# Patient Record
Sex: Female | Born: 1950 | Race: White | Hispanic: No | Smoking: Former smoker
Health system: Southern US, Community
[De-identification: ages and names within clinical notes are randomized; demographics above are authoritative.]

## PROBLEM LIST (undated history)

## (undated) DIAGNOSIS — R413 Other amnesia: Secondary | ICD-10-CM

## (undated) DIAGNOSIS — D649 Anemia, unspecified: Secondary | ICD-10-CM

## (undated) DIAGNOSIS — H9190 Unspecified hearing loss, unspecified ear: Secondary | ICD-10-CM

## (undated) DIAGNOSIS — H547 Unspecified visual loss: Secondary | ICD-10-CM

## (undated) DIAGNOSIS — E46 Unspecified protein-calorie malnutrition: Secondary | ICD-10-CM

## (undated) DIAGNOSIS — IMO0002 Reserved for concepts with insufficient information to code with codable children: Secondary | ICD-10-CM

## (undated) DIAGNOSIS — R471 Dysarthria and anarthria: Secondary | ICD-10-CM

## (undated) DIAGNOSIS — I10 Essential (primary) hypertension: Secondary | ICD-10-CM

## (undated) DIAGNOSIS — M1990 Unspecified osteoarthritis, unspecified site: Secondary | ICD-10-CM

## (undated) DIAGNOSIS — R0602 Shortness of breath: Secondary | ICD-10-CM

## (undated) DIAGNOSIS — I639 Cerebral infarction, unspecified: Secondary | ICD-10-CM

## (undated) DIAGNOSIS — N1330 Unspecified hydronephrosis: Secondary | ICD-10-CM

## (undated) DIAGNOSIS — J449 Chronic obstructive pulmonary disease, unspecified: Secondary | ICD-10-CM

## (undated) DIAGNOSIS — E785 Hyperlipidemia, unspecified: Secondary | ICD-10-CM

## (undated) DIAGNOSIS — Z720 Tobacco use: Secondary | ICD-10-CM

## (undated) DIAGNOSIS — G8191 Hemiplegia, unspecified affecting right dominant side: Secondary | ICD-10-CM

## (undated) DIAGNOSIS — R339 Retention of urine, unspecified: Secondary | ICD-10-CM

## (undated) DIAGNOSIS — B9562 Methicillin resistant Staphylococcus aureus infection as the cause of diseases classified elsewhere: Secondary | ICD-10-CM

## (undated) DIAGNOSIS — A419 Sepsis, unspecified organism: Secondary | ICD-10-CM

## (undated) DIAGNOSIS — E039 Hypothyroidism, unspecified: Secondary | ICD-10-CM

## (undated) DIAGNOSIS — R269 Unspecified abnormalities of gait and mobility: Secondary | ICD-10-CM

## (undated) DIAGNOSIS — F32A Depression, unspecified: Secondary | ICD-10-CM

## (undated) DIAGNOSIS — J189 Pneumonia, unspecified organism: Secondary | ICD-10-CM

## (undated) DIAGNOSIS — I509 Heart failure, unspecified: Secondary | ICD-10-CM

## (undated) DIAGNOSIS — R7881 Bacteremia: Secondary | ICD-10-CM

## (undated) DIAGNOSIS — F329 Major depressive disorder, single episode, unspecified: Secondary | ICD-10-CM

## (undated) DIAGNOSIS — N289 Disorder of kidney and ureter, unspecified: Secondary | ICD-10-CM

## (undated) HISTORY — PX: STRABISMUS SURGERY: SHX218

## (undated) HISTORY — PX: ARM DEBRIDEMENT: SHX890

## (undated) HISTORY — DX: Other amnesia: R41.3

## (undated) HISTORY — DX: Cerebral infarction, unspecified: I63.9

## (undated) HISTORY — DX: Unspecified abnormalities of gait and mobility: R26.9

## (undated) HISTORY — DX: Unspecified visual loss: H54.7

---

## 2001-04-26 ENCOUNTER — Ambulatory Visit (HOSPITAL_COMMUNITY): Admission: RE | Admit: 2001-04-26 | Discharge: 2001-04-26 | Payer: Self-pay | Admitting: Internal Medicine

## 2006-11-06 ENCOUNTER — Ambulatory Visit (HOSPITAL_COMMUNITY): Admission: RE | Admit: 2006-11-06 | Discharge: 2006-11-06 | Payer: Self-pay | Admitting: Family Medicine

## 2007-10-29 ENCOUNTER — Emergency Department (HOSPITAL_COMMUNITY): Admission: EM | Admit: 2007-10-29 | Discharge: 2007-10-30 | Payer: Self-pay | Admitting: Emergency Medicine

## 2009-01-07 ENCOUNTER — Ambulatory Visit: Payer: Self-pay | Admitting: Cardiology

## 2009-05-04 ENCOUNTER — Ambulatory Visit: Payer: Self-pay | Admitting: Gastroenterology

## 2009-05-04 ENCOUNTER — Encounter: Payer: Self-pay | Admitting: Internal Medicine

## 2009-05-04 DIAGNOSIS — D649 Anemia, unspecified: Secondary | ICD-10-CM | POA: Insufficient documentation

## 2009-05-04 DIAGNOSIS — R109 Unspecified abdominal pain: Secondary | ICD-10-CM

## 2009-05-04 DIAGNOSIS — N321 Vesicointestinal fistula: Secondary | ICD-10-CM | POA: Insufficient documentation

## 2009-05-04 DIAGNOSIS — D72829 Elevated white blood cell count, unspecified: Secondary | ICD-10-CM | POA: Insufficient documentation

## 2009-05-08 ENCOUNTER — Encounter: Payer: Self-pay | Admitting: Gastroenterology

## 2009-05-11 ENCOUNTER — Ambulatory Visit (HOSPITAL_COMMUNITY): Admission: RE | Admit: 2009-05-11 | Discharge: 2009-05-11 | Payer: Self-pay | Admitting: Internal Medicine

## 2009-05-15 ENCOUNTER — Encounter: Payer: Self-pay | Admitting: Internal Medicine

## 2009-05-16 ENCOUNTER — Encounter: Payer: Self-pay | Admitting: Internal Medicine

## 2009-06-05 HISTORY — PX: COLONOSCOPY: SHX174

## 2009-06-06 ENCOUNTER — Ambulatory Visit (HOSPITAL_COMMUNITY): Admission: RE | Admit: 2009-06-06 | Discharge: 2009-06-06 | Payer: Self-pay | Admitting: Internal Medicine

## 2009-06-06 ENCOUNTER — Ambulatory Visit: Payer: Self-pay | Admitting: Internal Medicine

## 2009-06-07 ENCOUNTER — Encounter: Payer: Self-pay | Admitting: Internal Medicine

## 2010-03-26 ENCOUNTER — Ambulatory Visit (HOSPITAL_COMMUNITY): Admission: RE | Admit: 2010-03-26 | Discharge: 2010-03-26 | Payer: Self-pay | Admitting: Internal Medicine

## 2010-06-16 ENCOUNTER — Encounter: Payer: Self-pay | Admitting: Internal Medicine

## 2010-06-27 NOTE — Letter (Signed)
Summary: [facility]  [facility]   Imported By: Diana Eves 06/07/2009 09:07:24  _____________________________________________________________________  External Attachment:    Type:   Image     Comment:   External Document

## 2010-10-11 NOTE — Op Note (Signed)
[facility]  Patient:    Kimberly [name], Kimberly [name] Visit Number: [id number] MRN: [id number]          Service Type: END Location: DAY Attending Physician:  Jonathon Bellows Dictated by:   Roetta Sessions, M.D. Proc. Date: 04/26/01 Admit Date:  04/26/2001   CC:         Effie Shy, M.D.   Operative Report  PROCEDURE:  Diagnostic colonoscopy.  ENDOSCOPIST:  Roetta Sessions, M.D.  INDICATION FOR PROCEDURE:  Patient is a 60 year old lady with Hemoccult-positive stool and intermittent rectal bleeding; there is some chronic constipation as well and some rectal pain with defecating.  It is reassuring that from April 19, 2001, her CBC is entirely normal.  H&H are 13.3 and 39.0, MCV 90.  Colonoscopy is now being done to further evaluate Hemoccult-positive stool, though she does not really have any upper GI tract symptoms.  This approach has been discussed at length with [name] today by me and also Tana Coast, P.A.C. and has discussed this procedure at length with Dr. Arty Baumgartner, patients brother and power of attorney.  Please see the documentation in the record for more information.  I feel [name] is at low risk for conscious sedation with Versed and Demerol.  DESCRIPTION OF PROCEDURE:  O2 saturation, blood pressure, pulse and respirations were monitored throughout the entire procedure.  Patient was placed in the left lateral decubitus position.  Conscious sedation:  IV Versed and Demerol in incremental doses.  INSTRUMENT:  Olympus video chip colonoscope.  FINDINGS:  Digital rectal exam revealed no abnormalities.  ENDOSCOPIC FINDINGS:  The prep was good.  Rectum:  Examination of the rectal mucosa including a retroflexed view of the anal verge revealed not only internal hemorrhoids, there were also anal canal hemorrhoids seen on-face.  Colon:  The colonic mucosa was surveyed from the rectosigmoid junction through the left, transverse and  right colon to the area of the appendiceal orifice, ileocecal valve and cecum.  These structures were seen and photographed.  The patient had transverse and left-sided diverticula.  The remainder of the colonic mucosa appeared normal.  From the level of the cecum and ileocecal valve (see photos), scope was slowly and cautiously withdrawn.  A previously mentioned mucosal surfaces were again seen.  No other abnormalities were observed.  The patient tolerated the procedure well and was reactive at endoscopy.  IMPRESSION: 1. Anal canal internal hemorrhoid, otherwise normal rectum. 2. Left-sided transverse diverticula.  Remainder of the colonic mucosa    appeared normal.  I suspect the patient bled from hemorrhoids.  RECOMMENDATIONS: 1. Daily Metamucil or Citrucel fiber supplement. 2. Ten-day course of Anusol-HC suppositories, one per rectum at bedtime. 3. Follow up with Dr. Hermina Staggers. Dictated by:   Roetta Sessions, M.D. Attending Physician:  Jonathon Bellows DD:  04/26/01 TD:  04/26/01 Job: [id number]

## 2011-02-20 LAB — DIFFERENTIAL
Basophils Relative: 1
Eosinophils Absolute: 0.1
Monocytes Relative: 7
Neutrophils Relative %: 67

## 2011-02-20 LAB — BASIC METABOLIC PANEL
BUN: 14
CO2: 25
Chloride: 98
Creatinine, Ser: 0.82
Glucose, Bld: 105 — ABNORMAL HIGH

## 2011-02-20 LAB — CBC
MCHC: 34.9
MCV: 86.7
Platelets: 363

## 2011-04-22 ENCOUNTER — Other Ambulatory Visit: Payer: Self-pay

## 2011-04-22 ENCOUNTER — Encounter (HOSPITAL_COMMUNITY): Payer: Self-pay | Admitting: Pharmacy Technician

## 2011-04-22 ENCOUNTER — Encounter (HOSPITAL_COMMUNITY): Payer: Self-pay

## 2011-04-22 ENCOUNTER — Encounter (HOSPITAL_COMMUNITY)
Admission: RE | Admit: 2011-04-22 | Discharge: 2011-04-22 | Disposition: A | Discharge status: Home / Self Care | Payer: Medicaid Other | Source: Ambulatory Visit | Attending: Ophthalmology | Admitting: Ophthalmology

## 2011-04-22 HISTORY — DX: Heart failure, unspecified: I50.9

## 2011-04-22 HISTORY — DX: Unspecified hearing loss, unspecified ear: H91.90

## 2011-04-22 HISTORY — DX: Chronic obstructive pulmonary disease, unspecified: J44.9

## 2011-04-22 HISTORY — DX: Anemia, unspecified: D64.9

## 2011-04-22 HISTORY — DX: Unspecified osteoarthritis, unspecified site: M19.90

## 2011-04-22 HISTORY — DX: Hypothyroidism, unspecified: E03.9

## 2011-04-22 LAB — CBC
MCH: 29.6 pg (ref 26.0–34.0)
Platelets: 422 10*3/uL — ABNORMAL HIGH (ref 150–400)
RBC: 4.86 MIL/uL (ref 3.87–5.11)
RDW: 14.5 % (ref 11.5–15.5)
WBC: 7.5 10*3/uL (ref 4.0–10.5)

## 2011-04-22 LAB — BASIC METABOLIC PANEL
CO2: 34 mEq/L — ABNORMAL HIGH (ref 19–32)
Calcium: 10.3 mg/dL (ref 8.4–10.5)
GFR calc Af Amer: 88 mL/min — ABNORMAL LOW (ref >90)
Sodium: 139 mEq/L (ref 135–145)

## 2011-04-22 MED ORDER — CYCLOPENTOLATE-PHENYLEPHRINE 0.2-1 % OP SOLN
OPHTHALMIC | Status: AC
  Filled 2011-04-22: qty 2

## 2011-04-22 NOTE — Patient Instructions (Addendum)
20 Kimberly [name]  04/22/2011   Your procedure is scheduled on:  04/28/2011  Report to [facility] at 06:15 AM.  Call this number if you have problems the morning of surgery: [phone]   Remember:   Do not eat food:After Midnight.  May have clear liquids:until Midnight .  Clear liquids include soda, tea, black coffee, apple or grape juice, broth.  Take these medicines the morning of surgery with A SIP OF WATER: Synthroid   Do not wear jewelry, make-up or nail polish.  Do not wear lotions, powders, or perfumes. You may wear deodorant.  Do not shave 48 hours prior to surgery.  Do not bring valuables to the hospital.  Contacts, dentures or bridgework may not be worn into surgery.  Leave suitcase in the car. After surgery it may be brought to your room.  For patients admitted to the hospital, checkout time is 11:00 AM the day of discharge.   Patients discharged the day of surgery will not be allowed to drive home.  Name and phone number of your driver:   Special Instructions: N/A   Please read over the following fact sheets that you were given: Anesthesia Post-op Instructions    Cataract A cataract is a clouding of the lens of the eye. It is most often related to aging. A cataract is not a "film" over the surface of the eye. The lens is inside the eye and changes size of the pupil. The lens can enlarge to let more light enter the eye in dark environments and contract the size of the pupil to let in bright light. The lens is the part of the eye that helps focus light on the retina. The retina is the eye's light-sensitive layer. It is in the back of the eye that sends visual signals to the brain. In a normal eye, light passes through the lens and gets focused on the retina. To help produce a sharp image, the lens must remain clear. When a lens becomes cloudy, vision is compromised by the degree and nature of the clouding. Certain cataracts make people more near-sighted as they develop, others  increase glare, and all reduce vision to some degree or another. A cataract that is so dense that it becomes milky white and a white opacity can be seen through the pupil. When the white color is seen, it is called a "mature" or "hyper-mature cataract." Such cataracts cause total blindness in the affected eye. The cataract must be removed to prevent damage to the eye itself. Some types of cataracts can cause a secondary disease of the eye, such as certain types of glaucoma. In the early stages, better lighting and eyeglasses may lessen vision problems caused by cataracts. At a certain point, surgery may be needed to improve vision. CAUSES   Aging. However, cataracts may occur at any age, even in newborns.   Certain drugs.   Trauma to the eye.   Certain diseases (such as diabetes).   Inherited or acquired medical syndromes.  SYMPTOMS   Gradual, progressive drop in vision in the affected eye. Cataracts may develop at different rates in each eye. Cataracts may even be in just one eye with the other unaffected.   Cataracts due to trauma may develop quickly, sometimes over a matter or days or even hours. The result is severe and rapid visual loss.  DIAGNOSIS  To detect a cataract, an eye doctor examines the lens. A well developed cataract can be diagnosed without dilating the pupil.  Early cataracts and others of a specific nature are best diagnosed with an exam of the eyes with the pupils dilated by drops. TREATMENT   For an early cataract, vision may improve by using different eyeglasses or stronger lighting.   If the above measures do not help, surgery is the only effective treatment. This treatment removes the cloudy lens and replaces it with a substitute lens (Intraocular lens, or IOL). Newly developed IOL technology allows the implanted lens to improve vision both at a distance and up close. Discuss with your eye surgeon about the possibility of still needing glasses. Also discuss how visual  coordination between both eyes will be affected.  A cataract needs to be removed only when vision loss interferes with your everyday activities such as driving, reading or watching TV. You and your eye doctor can make that decision together. In most cases, waiting until you are ready to have cataract surgery will not harm your eye. If you have cataracts in both eyes, only one should be removed at a time. This allows the operated eye to heal and be out of danger from serious problems (such as infection or poor wound healing) before having the other eye undergo surgery.  Sometimes, a cataract should be removed even if it does not cause problems with your vision. For example, a cataract should be removed if it prevents examination or treatment of another eye problem. Just as you cannot see out of the affected eye well, your doctor cannot see into your eye well through a cataract. The vast majority of people who have cataract surgery have better vision afterward. CATARACT REMOVAL There are two primary ways to remove a cataract. Your doctor can explain the differences and help determine which is best for you:  Phacoemulsification (small incision cataract surgery). This involves making a small cut (incision) on the edge of the clear, dome-shaped surface that covers the front of the eye (the cornea). An injection behind the eye or eye drops are given to make this a painless procedure. The doctor then inserts a tiny probe into the eye. This device emits ultrasound waves that soften and break up the cloudy center of the lens so it can be removed by suction. Most cataract surgery is done this way. The cuts are usually so small and performed in such a manner that often no sutures are needed to keep it closed.   Extracapsular surgery. Your doctor makes a slightly longer incision on the side of the cornea. The doctor removes the hard center of the lens. The remainder of the lens is then removed by suction. In some  cases, extremely fine sutures are needed which the doctor may, or may not remove in the office after the surgery.  When an IOL is implanted, it needs no care. It becomes a permanent part of your eye and cannot be seen or felt.  Some people cannot have an IOL. They may have problems during surgery, or maybe they have another eye disease. For these people, a soft contact lens may be suggested. If an IOL or contact lens cannot be used, very powerful and thick glasses are required after surgery. Since vision is very different through such thick glasses, it is important to have your doctor discuss the impact on your vision after any cataract surgery where there is no plan to implant an IOL. The normal lens of the eye is covered by a clear capsule. Both phacoemulsification and extracapsular surgery require that the back surface of this  lens capsule be left in place. This helps support IOLs and prevents the IOL from dislocating and falling back into the deeper interior of the eye. Right after surgery, and often permanently this "posterior capsule" remains clear. In some cases however, it can become cloudy, presenting the same type of visual compromise that the original cataract did since light is again obstructed as it passes through the clear IOL. This condition is often referred to as an "after-cataract." Fortunately, after-cataracts are easily treated using a painless and very fast laser treatment that is performed without anesthesia or incisions. It is done in a matter of minutes in an outpatient environment. Visual improvement is often immediate.  HOME CARE INSTRUCTIONS   Your surgeon will discuss pre and post operative care with you prior to surgery. The majority of people are able to do almost all normal activities right away. Although, it is often advised to avoid strenuous activity for a period of time.   Postoperative drops and careful avoidance of infection will be needed. Many surgeons suggest the use  of a protective shield during the first few days after surgery.   There is a very small incidence of complication from modern cataract surgery, but it can happen. Infection that spreads to the inside of the eye (endophthalmitis) can result in total visual loss and even loss of the eye itself. In extremely rare instances, the inflammation of endophthalmitis can spread to both eyes (sympathetic ophthalmia). Appropriate post-operative care under the close observation of your surgeon is essential to a successful outcome.  SEEK IMMEDIATE MEDICAL CARE IF:   You have any sudden drop of vision in the operated eye.   You have pain in the operated eye.   You see a large number of floating dots in the field of vision in the operated eye.   You see flashing lights, or if a portion of your side vision in any direction appears black (like a curtain being drawn into your field of vision) in the operated eye.  Document Released: 05/12/2005 Document Revised: 01/22/2011 Document Reviewed: 06/28/2007 [facility] Patient Information 2012 [place], [place].     PATIENT INSTRUCTIONS POST-ANESTHESIA  IMMEDIATELY FOLLOWING SURGERY:  Do not drive or operate machinery for the first twenty four hours after surgery.  Do not make any important decisions for twenty four hours after surgery or while taking narcotic pain medications or sedatives.  If you develop intractable nausea and vomiting or a severe headache please notify your doctor immediately.  FOLLOW-UP:  Please make an appointment with your surgeon as instructed. You do not need to follow up with anesthesia unless specifically instructed to do so.  WOUND CARE INSTRUCTIONS (if applicable):  Keep a dry clean dressing on the anesthesia/puncture wound site if there is drainage.  Once the wound has quit draining you may leave it open to air.  Generally you should leave the bandage intact for twenty four hours unless there is drainage.  If the epidural site drains for more  than 36-48 hours please call the anesthesia department.  QUESTIONS?:  Please feel free to call your physician or the hospital operator if you have any questions, and they will be happy to assist you.     [facility] Anesthesia Department [address] [place] [phone]

## 2011-04-28 ENCOUNTER — Ambulatory Visit (HOSPITAL_COMMUNITY)
Admission: RE | Admit: 2011-04-28 | Discharge: 2011-04-28 | Disposition: A | Discharge status: Skilled Nursing Facility | Payer: Medicaid Other | Source: Ambulatory Visit | Attending: Ophthalmology | Admitting: Ophthalmology

## 2011-04-28 ENCOUNTER — Encounter (HOSPITAL_COMMUNITY): Payer: Self-pay | Admitting: Anesthesiology

## 2011-04-28 ENCOUNTER — Encounter (HOSPITAL_COMMUNITY): Payer: Self-pay | Admitting: *Deleted

## 2011-04-28 ENCOUNTER — Encounter (HOSPITAL_COMMUNITY)
Admission: RE | Disposition: A | Discharge status: Skilled Nursing Facility | Payer: Self-pay | Source: Ambulatory Visit | Attending: Ophthalmology

## 2011-04-28 ENCOUNTER — Ambulatory Visit (HOSPITAL_COMMUNITY): Payer: Medicaid Other | Admitting: Anesthesiology

## 2011-04-28 DIAGNOSIS — Z01810 Encounter for preprocedural cardiovascular examination: Secondary | ICD-10-CM | POA: Insufficient documentation

## 2011-04-28 DIAGNOSIS — J449 Chronic obstructive pulmonary disease, unspecified: Secondary | ICD-10-CM | POA: Insufficient documentation

## 2011-04-28 DIAGNOSIS — Z01812 Encounter for preprocedural laboratory examination: Secondary | ICD-10-CM | POA: Insufficient documentation

## 2011-04-28 DIAGNOSIS — H2510 Age-related nuclear cataract, unspecified eye: Secondary | ICD-10-CM | POA: Insufficient documentation

## 2011-04-28 DIAGNOSIS — J4489 Other specified chronic obstructive pulmonary disease: Secondary | ICD-10-CM | POA: Insufficient documentation

## 2011-04-28 HISTORY — PX: CATARACT EXTRACTION W/PHACO: SHX586

## 2011-04-28 SURGERY — PHACOEMULSIFICATION, CATARACT, WITH IOL INSERTION
Anesthesia: Monitor Anesthesia Care | Site: Eye | Laterality: Left | Wound class: Clean

## 2011-04-28 MED ORDER — TETRACAINE HCL 0.5 % OP SOLN
OPHTHALMIC | Status: AC
  Filled 2011-04-28: qty 2

## 2011-04-28 MED ORDER — NA HYALUR & NA CHOND-NA HYALUR 0.55-0.5 ML IO KIT
PACK | INTRAOCULAR | Status: DC | PRN
  Administered 2011-04-28: 1 via OPHTHALMIC

## 2011-04-28 MED ORDER — TETRACAINE 0.5 % OP SOLN OPTIME - NO CHARGE
OPHTHALMIC | Status: DC | PRN
  Administered 2011-04-28: 1 [drp] via OPHTHALMIC

## 2011-04-28 MED ORDER — LIDOCAINE HCL 3.5 % OP GEL: Freq: Once | OPHTHALMIC | Status: DC

## 2011-04-28 MED ORDER — KETOROLAC TROMETHAMINE 0.4 % OP SOLN - NO CHARGE
1.0000 [drp] | Freq: Once | OPHTHALMIC | Status: DC
  Filled 2011-04-28: qty 5

## 2011-04-28 MED ORDER — CYCLOPENTOLATE-PHENYLEPHRINE 0.2-1 % OP SOLN
1.0000 [drp] | OPHTHALMIC | Status: AC
  Administered 2011-04-28 (×3): 1 [drp] via OPHTHALMIC

## 2011-04-28 MED ORDER — GATIFLOXACIN 0.5 % OP SOLN OPTIME - NO CHARGE
OPHTHALMIC | Status: DC | PRN
  Administered 2011-04-28: 1 [drp] via OPHTHALMIC

## 2011-04-28 MED ORDER — NEOMYCIN-POLYMYXIN-DEXAMETH 3.5-10000-0.1 OP SUSP
OPHTHALMIC | Status: AC
  Filled 2011-04-28: qty 5

## 2011-04-28 MED ORDER — EPINEPHRINE HCL 1 MG/ML IJ SOLN
INTRAOCULAR | Status: DC | PRN
  Administered 2011-04-28: 09:00:00

## 2011-04-28 MED ORDER — CYCLOPENTOLATE-PHENYLEPHRINE 0.2-1 % OP SOLN
OPHTHALMIC | Status: AC
  Administered 2011-04-28: 1 [drp] via OPHTHALMIC
  Filled 2011-04-28: qty 2

## 2011-04-28 MED ORDER — LIDOCAINE 3.5 % OP GEL OPTIME - NO CHARGE
OPHTHALMIC | Status: DC | PRN
  Administered 2011-04-28: 2 [drp] via OPHTHALMIC

## 2011-04-28 MED ORDER — EPINEPHRINE HCL 1 MG/ML IJ SOLN
INTRAMUSCULAR | Status: AC
  Filled 2011-04-28: qty 1

## 2011-04-28 MED ORDER — LIDOCAINE HCL 3.5 % OP GEL
OPHTHALMIC | Status: AC
  Filled 2011-04-28: qty 5

## 2011-04-28 MED ORDER — GATIFLOXACIN 0.5 % OP SOLN
OPHTHALMIC | Status: AC
  Administered 2011-04-28: 1 [drp]
  Filled 2011-04-28: qty 2.5

## 2011-04-28 MED ORDER — MIDAZOLAM HCL 2 MG/2ML IJ SOLN
INTRAMUSCULAR | Status: AC
  Administered 2011-04-28: 2 mg via INTRAVENOUS
  Filled 2011-04-28: qty 2

## 2011-04-28 MED ORDER — GATIFLOXACIN 0.5 % OP SOLN OPTIME - NO CHARGE
1.0000 [drp] | Freq: Once | OPHTHALMIC | Status: DC
  Filled 2011-04-28: qty 2.5

## 2011-04-28 MED ORDER — LACTATED RINGERS IV SOLN
INTRAVENOUS | Status: DC
  Administered 2011-04-28: 1000 mL via INTRAVENOUS

## 2011-04-28 MED ORDER — GATIFLOXACIN 0.5 % OP SOLN OPTIME - NO CHARGE: 1.0000 [drp] | Freq: Once | OPHTHALMIC | Status: DC

## 2011-04-28 MED ORDER — KETOROLAC TROMETHAMINE 0.5 % OP SOLN
OPHTHALMIC | Status: AC
  Administered 2011-04-28: 1 [drp]
  Filled 2011-04-28: qty 5

## 2011-04-28 MED ORDER — MIDAZOLAM HCL 2 MG/2ML IJ SOLN
1.0000 mg | INTRAMUSCULAR | Status: DC | PRN
  Administered 2011-04-28: 2 mg via INTRAVENOUS

## 2011-04-28 MED ORDER — MOXIFLOXACIN HCL 0.5 % OP SOLN - NO CHARGE
1.0000 [drp] | Freq: Once | OPHTHALMIC | Status: DC
  Filled 2011-04-28: qty 3

## 2011-04-28 MED ORDER — TETRACAINE HCL 0.5 % OP SOLN
1.0000 [drp] | Freq: Once | OPHTHALMIC | Status: AC
  Administered 2011-04-28: 1 [drp] via OPHTHALMIC

## 2011-04-28 MED ORDER — BSS IO SOLN
INTRAOCULAR | Status: DC | PRN
  Administered 2011-04-28: 15 mL via INTRAOCULAR

## 2011-04-28 SURGICAL SUPPLY — 28 items
CAPSULAR TENSION RING-AMO (OPHTHALMIC RELATED) IMPLANT
CLOTH BEACON ORANGE TIMEOUT ST (SAFETY) ×2 IMPLANT
GLOVE BIO SURGEON STRL SZ7.5 (GLOVE) IMPLANT
GLOVE BIOGEL M 6.5 STRL (GLOVE) IMPLANT
GLOVE BIOGEL PI IND STRL 6.5 (GLOVE) ×1 IMPLANT
GLOVE BIOGEL PI IND STRL 7.0 (GLOVE) IMPLANT
GLOVE BIOGEL PI INDICATOR 6.5 (GLOVE) ×1
GLOVE BIOGEL PI INDICATOR 7.0 (GLOVE)
GLOVE ECLIPSE 6.5 STRL STRAW (GLOVE) IMPLANT
GLOVE ECLIPSE 7.5 STRL STRAW (GLOVE) IMPLANT
GLOVE EXAM NITRILE LRG STRL (GLOVE) IMPLANT
GLOVE EXAM NITRILE MD LF STRL (GLOVE) ×2 IMPLANT
GLOVE SKINSENSE NS SZ6.5 (GLOVE) ×1
GLOVE SKINSENSE NS SZ7.0 (GLOVE)
GLOVE SKINSENSE STRL SZ6.5 (GLOVE) ×1 IMPLANT
GLOVE SKINSENSE STRL SZ7.0 (GLOVE) IMPLANT
GLOVE SURG SS PI 6.5 STRL IVOR (GLOVE) ×2 IMPLANT
INST SET CATARACT ~~LOC~~ (KITS) ×2 IMPLANT
KIT VITRECTOMY (OPHTHALMIC RELATED) IMPLANT
PAD ARMBOARD 7.5X6 YLW CONV (MISCELLANEOUS) ×2 IMPLANT
PROC W NO LENS (INTRAOCULAR LENS)
PROC W SPEC LENS (INTRAOCULAR LENS)
PROCESS W NO LENS (INTRAOCULAR LENS) IMPLANT
PROCESS W SPEC LENS (INTRAOCULAR LENS) IMPLANT
RING MALYGIN (MISCELLANEOUS) IMPLANT
SIGHTPATH CAT PROC W REG LENS (Ophthalmic Related) ×2 IMPLANT
VISCOELASTIC ADDITIONAL (OPHTHALMIC RELATED) IMPLANT
WATER STERILE IRR 250ML POUR (IV SOLUTION) ×2 IMPLANT

## 2011-04-28 NOTE — H&P (Signed)
See scanned h&p in paper chart

## 2011-04-28 NOTE — Brief Op Note (Signed)
04/28/2011  11:49 AM  PATIENT:  Kimberly [name]  60 y.o. female  PRE-OPERATIVE DIAGNOSIS:  nuclear cataract left eye  POST-OPERATIVE DIAGNOSIS:  nuclear cataract left eye  PROCEDURE:  Procedure(s): CATARACT EXTRACTION PHACO AND INTRAOCULAR LENS PLACEMENT (IOC)  SURGEON:  Surgeon(s): Susa Simmonds  ASSISTANTS:Wendy Kendrick, CST  ANESTHESIA STAFF: Despina Hidden - CRNA Laurene Footman, MD - Anesthesiologist  ANESTHESIA:   topical/MAC  REQUESTED LENS POWER: 30.0 SN60WF  LENS IMPLANT INFORMATION: [employer]con SN 60 WF s/n [id number] exp 02/2013  CUMULATIVE DISSIPATED ENERGY:5.37  INDICATIONS:pt having difficulty with desired daily activities- TV viewing/eating/reading  OP FINDINGS:dense cataract  COMPLICATIONS:None  DICTATION #: see scanned op note  PLAN OF CARE: see office notes  PATIENT DISPOSITION:  Short Stay

## 2011-04-28 NOTE — Brief Op Note (Signed)
Pt interviewed and examined this morning.  There are no changes.

## 2011-04-28 NOTE — Anesthesia Preprocedure Evaluation (Signed)
Anesthesia Evaluation  Patient identified by MRN, date of birth, ID band Patient awake    Reviewed: Allergy & Precautions, H&P , NPO status , Patient's Chart, lab work & pertinent test results  History of Anesthesia Complications Negative for: history of anesthetic complications  Airway Mallampati: II      Dental  (+) Edentulous Upper and Edentulous Lower   Pulmonary COPD   Pulmonary exam normal       Cardiovascular +CHF Regular Normal    Neuro/Psych    GI/Hepatic   Endo/Other  Hypothyroidism   Renal/GU      Musculoskeletal   Abdominal   Peds  Hematology   Anesthesia Other Findings   Reproductive/Obstetrics                           Anesthesia Physical Anesthesia Plan  ASA: III  Anesthesia Plan: MAC   Post-op Pain Management:    Induction:   Airway Management Planned: Nasal Cannula  Additional Equipment:   Intra-op Plan:   Post-operative Plan:   Informed Consent: I have reviewed the patients History and Physical, chart, labs and discussed the procedure including the risks, benefits and alternatives for the proposed anesthesia with the patient or authorized representative who has indicated his/her understanding and acceptance.     Plan Discussed with:   Anesthesia Plan Comments:         Anesthesia Quick Evaluation

## 2011-04-28 NOTE — H&P (Signed)
Pt interviewed and examined this morning.  There are no changes in status.

## 2011-04-28 NOTE — Transfer of Care (Signed)
Immediate Anesthesia Transfer of Care Note  Patient: Kimberly [name]  Procedure(s) Performed:  CATARACT EXTRACTION PHACO AND INTRAOCULAR LENS PLACEMENT (IOC) - CDE: 5.37  Patient Location: PACU and Short Stay  Anesthesia Type: MAC  Level of Consciousness: awake, alert , oriented and patient cooperative  Airway & Oxygen Therapy: Patient Spontanous Breathing  Post-op Assessment: Report given to PACU RN, Post -op Vital signs reviewed and stable and Patient moving all extremities X 4  Post vital signs: Reviewed and stable  Complications: No apparent anesthesia complications

## 2011-04-28 NOTE — Anesthesia Postprocedure Evaluation (Signed)
  Anesthesia Post-op Note  Patient: Kimberly [name]  Procedure(s) Performed:  CATARACT EXTRACTION PHACO AND INTRAOCULAR LENS PLACEMENT (IOC) - CDE: 5.37  Patient Location: PACU and Short Stay  Anesthesia Type: MAC  Level of Consciousness: awake, alert , oriented and patient cooperative  Airway and Oxygen Therapy: Patient Spontanous Breathing  Post-op Pain: none  Post-op Assessment: Post-op Vital signs reviewed, Patient's Cardiovascular Status Stable, Respiratory Function Stable, Patent Airway and No signs of Nausea or vomiting  Post-op Vital Signs: Reviewed and stable  Complications: No apparent anesthesia complications

## 2011-05-01 ENCOUNTER — Encounter (HOSPITAL_COMMUNITY): Payer: Self-pay | Admitting: Ophthalmology

## 2011-06-01 ENCOUNTER — Encounter (HOSPITAL_COMMUNITY): Payer: Self-pay | Admitting: *Deleted

## 2011-06-01 ENCOUNTER — Other Ambulatory Visit: Payer: Self-pay

## 2011-06-01 ENCOUNTER — Emergency Department (HOSPITAL_COMMUNITY)
Admission: EM | Admit: 2011-06-01 | Discharge: 2011-06-01 | Disposition: A | Discharge status: Nursing Home (Includes ICF) | Payer: Medicaid Other | Attending: Emergency Medicine | Admitting: Emergency Medicine

## 2011-06-01 ENCOUNTER — Emergency Department (HOSPITAL_COMMUNITY): Payer: Medicaid Other

## 2011-06-01 DIAGNOSIS — M1990 Unspecified osteoarthritis, unspecified site: Secondary | ICD-10-CM | POA: Insufficient documentation

## 2011-06-01 DIAGNOSIS — E039 Hypothyroidism, unspecified: Secondary | ICD-10-CM | POA: Insufficient documentation

## 2011-06-01 DIAGNOSIS — Y9210 Unspecified residential institution as the place of occurrence of the external cause: Secondary | ICD-10-CM | POA: Insufficient documentation

## 2011-06-01 DIAGNOSIS — H9190 Unspecified hearing loss, unspecified ear: Secondary | ICD-10-CM | POA: Insufficient documentation

## 2011-06-01 DIAGNOSIS — S22009A Unspecified fracture of unspecified thoracic vertebra, initial encounter for closed fracture: Secondary | ICD-10-CM | POA: Insufficient documentation

## 2011-06-01 DIAGNOSIS — M546 Pain in thoracic spine: Secondary | ICD-10-CM | POA: Insufficient documentation

## 2011-06-01 DIAGNOSIS — F17200 Nicotine dependence, unspecified, uncomplicated: Secondary | ICD-10-CM | POA: Insufficient documentation

## 2011-06-01 DIAGNOSIS — Z136 Encounter for screening for cardiovascular disorders: Secondary | ICD-10-CM | POA: Insufficient documentation

## 2011-06-01 DIAGNOSIS — X58XXXA Exposure to other specified factors, initial encounter: Secondary | ICD-10-CM | POA: Insufficient documentation

## 2011-06-01 DIAGNOSIS — I509 Heart failure, unspecified: Secondary | ICD-10-CM | POA: Insufficient documentation

## 2011-06-01 DIAGNOSIS — S22000A Wedge compression fracture of unspecified thoracic vertebra, initial encounter for closed fracture: Secondary | ICD-10-CM

## 2011-06-01 DIAGNOSIS — J4489 Other specified chronic obstructive pulmonary disease: Secondary | ICD-10-CM | POA: Insufficient documentation

## 2011-06-01 DIAGNOSIS — Z862 Personal history of diseases of the blood and blood-forming organs and certain disorders involving the immune mechanism: Secondary | ICD-10-CM | POA: Insufficient documentation

## 2011-06-01 DIAGNOSIS — J449 Chronic obstructive pulmonary disease, unspecified: Secondary | ICD-10-CM | POA: Insufficient documentation

## 2011-06-01 MED ORDER — HYDROCODONE-ACETAMINOPHEN 5-325 MG PO TABS: 1.0000 | ORAL_TABLET | Freq: Four times a day (QID) | ORAL | Status: AC | PRN

## 2011-06-01 NOTE — ED Notes (Signed)
Pt from [facility].  Reports that she has lower back pain. States she turned over and back pain began.  Pt also reports chest hurts from coughing. Denies fever, nausea or vomiting.

## 2011-06-01 NOTE — ED Provider Notes (Signed)
History     CSN: [id number]  Arrival date & time 06/01/11  0049   First MD Initiated Contact with Patient 06/01/11 0109      Chief Complaint  Patient presents with  . Back Pain    (Consider location/radiation/quality/duration/timing/severity/associated sxs/prior treatment) HPI This is a 61 year old white female who rolled over in bed this morning and felt a sudden onset of sharp, severe pain in the mid to lower thoracic spine. The pain radiated to her chest. It was worse with standing up or sitting up, better bending forward or leaning back. It hurt to move. She was not short of breath. She's had no fever, cough, nausea or vomiting. She did have some similar pain earlier yesterday evening, which she described as chest pain from her back that was worse with movement. She denies pain or swelling in her legs apart from chronic arthritic pain. This morning's pain lasted until about 25 minutes after she arrived and is now gone. She can move and take deep breaths without pain.  Past Medical History  Diagnosis Date  . COPD (chronic obstructive pulmonary disease)   . CHF (congestive heart failure)   . Anemia   . Arthritis     osteoarthritis  . Hypothyroidism   . HOH (hard of hearing)     Past Surgical History  Procedure Date  . Cesarean section   . Arm debridement     as child  . Strabismus surgery     age 2  . Cataract extraction w/phaco 04/28/2011    Procedure: CATARACT EXTRACTION PHACO AND INTRAOCULAR LENS PLACEMENT (IOC);  Surgeon: Susa Simmonds;  Location: [facility];  Service: Ophthalmology;  Laterality: Left;  CDE: 5.37    History reviewed. No pertinent family history.  History  Substance Use Topics  . Smoking status: Current Everyday Smoker -- 0.5 packs/day  . Smokeless tobacco: Not on file  . Alcohol Use: No    OB History    Grav Para Term Preterm Abortions TAB SAB Ect Mult Living                  Review of Systems  All other systems reviewed and are  negative.    Allergies  Review of patient's allergies indicates no known allergies.  Home Medications   Current Outpatient Rx  Name Route Sig Dispense Refill  . ACETAMINOPHEN 325 MG PO TABS Oral Take 650 mg by mouth 3 (three) times daily.      . ACETAMINOPHEN 325 MG PO TABS Oral Take 650 mg by mouth every 4 (four) hours as needed. pain     . CHOLECALCIFEROL 400 UNITS PO TABS Oral Take 800 Units by mouth daily.      . FUROSEMIDE 20 MG PO TABS Oral Take 20 mg by mouth daily.      Marland Kitchen GATIFLOXACIN 0.5 % OP SOLN OPTIME - NO CHARGE Left Eye Place 1 drop into the left eye once. 1 each 0  . KETOROLAC TROMETHAMINE 0.4 % OP SOLN Both Eyes Place 1 drop into both eyes 4 (four) times daily. Begin 1 day prior to surgery     . LEVOTHYROXINE SODIUM 25 MCG PO TABS Oral Take 25 mcg by mouth daily.      Marland Kitchen LORAZEPAM 0.5 MG PO TABS Oral Take 0.5 mg by mouth 2 (two) times daily.      Marland Kitchen MOXIFLOXACIN HCL 0.5 % OP SOLN Both Eyes Place 1 drop into both eyes 4 (four) times daily. Begin using 3 days prior  to surgery     . THERA M PLUS PO TABS Oral Take 1 tablet by mouth daily.      Marland Kitchen POLYETHYLENE GLYCOL 3350 PO PACK Oral Take 17 g by mouth daily. constipation     . PREDNISOLONE ACETATE 1 % OP SUSP Both Eyes Place 1 drop into both eyes 4 (four) times daily. Begin using 3 days prior to surgery       BP 105/67  Pulse 65  Temp(Src) 97.7 F (36.5 C) (Oral)  Resp 20  Ht 5\' 1"  (1.549 m)  SpO2 97%  Physical Exam General: Well-developed, well-nourished female in no acute distress; appearance consistent with age of record HENT: normocephalic, atraumatic Eyes: pupils equal round and reactive to light; extraocular muscles intact; arcus senilis bilateral Neck: supple Heart: regular rate and rhythm Lungs: clear to auscultation bilaterally Spine: nontender;mobile without pain Abdomen: soft; nontender; nondistended Extremities: No deformity; full range of motion; pulses normal; nontender; trace edema of lower  leg Neurologic: Awake, alert; motor function intact in all extremities and symmetric; no facial droop Skin: Warm and dry     ED Course  Procedures (including critical care time)    MDM  EKG Interpretation:  Date & Time: 06/01/2011 1:31 AM  Rate: 79  Rhythm: normal sinus rhythm  QRS Axis: normal  Intervals: normal  ST/T Wave abnormalities: normal  Conduction Disutrbances:none  Narrative Interpretation:   Old EKG Reviewed: no significant changes  Dg Thoracic Spine W/swimmers  06/01/2011  *RADIOLOGY REPORT*  Clinical Data: 61 year old female with mid back pain.  THORACIC SPINE - 2 VIEW + SWIMMERS  Comparison: 05/11/2009 CT  Findings: A 50% T12 superior endplate compression fracture is unchanged with mild superior bony retropulsion. A 50% T8 compression fracture appears remote but is age indeterminate. Normal alignment is noted. Mild multilevel degenerative disc disease is noted. Minimal apex right thoracic scoliosis is present.  IMPRESSION: 50% T8 compression fracture - appears remote but of uncertain chronicity. Correlate with level of pain.  Remote 50% T12 compression fracture.  No other significant abnormalities identified.  Original Report Authenticated By: Rosendo Gros, M.D.   Ct Thoracic Spine Wo Contrast  06/01/2011  *RADIOLOGY REPORT*  Clinical Data: T8 compression fracture on radiograph  CT THORACIC SPINE WITHOUT CONTRAST  Technique:  Multidetector CT imaging of the focused levels thoracic spine was performed without intravenous contrast administration. Multiplanar CT image reconstructions were also generated  Comparison: Contemporaneous radiograph  Findings: Visualized lungs are clear.  Compression deformity of the vertebral body marked as T8 on the contemporaneous radiograph.  No retropulsion.  50% body height loss.  IMPRESSION: T8 compression deformity with 50% vertebral body height loss.  No retropulsion.  Original Report Authenticated By: Waneta Martins, M.D.               Hanley Seamen, MD 06/01/11 [phone]

## 2011-06-01 NOTE — ED Notes (Signed)
edp in to see pt prior to nurse

## 2011-07-16 ENCOUNTER — Other Ambulatory Visit (HOSPITAL_COMMUNITY): Payer: Self-pay | Admitting: "Endocrinology

## 2011-07-16 ENCOUNTER — Ambulatory Visit (HOSPITAL_COMMUNITY)
Admission: RE | Admit: 2011-07-16 | Discharge: 2011-07-16 | Disposition: A | Discharge status: Home / Self Care | Payer: Medicaid Other | Source: Ambulatory Visit | Attending: "Endocrinology | Admitting: "Endocrinology

## 2011-07-16 DIAGNOSIS — R05 Cough: Secondary | ICD-10-CM

## 2011-07-16 DIAGNOSIS — R059 Cough, unspecified: Secondary | ICD-10-CM | POA: Insufficient documentation

## 2011-07-29 ENCOUNTER — Emergency Department (HOSPITAL_COMMUNITY): Payer: Medicaid Other

## 2011-07-29 ENCOUNTER — Inpatient Hospital Stay (HOSPITAL_COMMUNITY)
Admission: EM | Admit: 2011-07-29 | Discharge: 2011-08-05 | DRG: 445 | Disposition: A | Discharge status: Home / Self Care | Payer: Medicaid Other | Attending: "Endocrinology | Admitting: "Endocrinology

## 2011-07-29 ENCOUNTER — Other Ambulatory Visit: Payer: Self-pay

## 2011-07-29 ENCOUNTER — Encounter (HOSPITAL_COMMUNITY): Payer: Self-pay | Admitting: *Deleted

## 2011-07-29 DIAGNOSIS — K811 Chronic cholecystitis: Secondary | ICD-10-CM

## 2011-07-29 DIAGNOSIS — E46 Unspecified protein-calorie malnutrition: Secondary | ICD-10-CM | POA: Diagnosis present

## 2011-07-29 DIAGNOSIS — E039 Hypothyroidism, unspecified: Secondary | ICD-10-CM | POA: Diagnosis present

## 2011-07-29 DIAGNOSIS — K8000 Calculus of gallbladder with acute cholecystitis without obstruction: Principal | ICD-10-CM | POA: Diagnosis present

## 2011-07-29 DIAGNOSIS — R112 Nausea with vomiting, unspecified: Secondary | ICD-10-CM | POA: Diagnosis present

## 2011-07-29 DIAGNOSIS — Z79899 Other long term (current) drug therapy: Secondary | ICD-10-CM

## 2011-07-29 DIAGNOSIS — R7401 Elevation of levels of liver transaminase levels: Secondary | ICD-10-CM | POA: Diagnosis present

## 2011-07-29 DIAGNOSIS — E860 Dehydration: Secondary | ICD-10-CM | POA: Diagnosis present

## 2011-07-29 DIAGNOSIS — J449 Chronic obstructive pulmonary disease, unspecified: Secondary | ICD-10-CM | POA: Diagnosis present

## 2011-07-29 DIAGNOSIS — R7402 Elevation of levels of lactic acid dehydrogenase (LDH): Secondary | ICD-10-CM | POA: Diagnosis present

## 2011-07-29 DIAGNOSIS — F17200 Nicotine dependence, unspecified, uncomplicated: Secondary | ICD-10-CM | POA: Diagnosis present

## 2011-07-29 DIAGNOSIS — Z681 Body mass index (BMI) 19 or less, adult: Secondary | ICD-10-CM

## 2011-07-29 DIAGNOSIS — R509 Fever, unspecified: Secondary | ICD-10-CM

## 2011-07-29 DIAGNOSIS — IMO0002 Reserved for concepts with insufficient information to code with codable children: Secondary | ICD-10-CM | POA: Diagnosis present

## 2011-07-29 DIAGNOSIS — I509 Heart failure, unspecified: Secondary | ICD-10-CM | POA: Diagnosis present

## 2011-07-29 DIAGNOSIS — H9190 Unspecified hearing loss, unspecified ear: Secondary | ICD-10-CM | POA: Diagnosis present

## 2011-07-29 DIAGNOSIS — J4489 Other specified chronic obstructive pulmonary disease: Secondary | ICD-10-CM | POA: Diagnosis present

## 2011-07-29 DIAGNOSIS — E785 Hyperlipidemia, unspecified: Secondary | ICD-10-CM | POA: Diagnosis present

## 2011-07-29 DIAGNOSIS — A498 Other bacterial infections of unspecified site: Secondary | ICD-10-CM | POA: Diagnosis present

## 2011-07-29 DIAGNOSIS — M1990 Unspecified osteoarthritis, unspecified site: Secondary | ICD-10-CM | POA: Diagnosis present

## 2011-07-29 DIAGNOSIS — D638 Anemia in other chronic diseases classified elsewhere: Secondary | ICD-10-CM | POA: Diagnosis present

## 2011-07-29 DIAGNOSIS — N390 Urinary tract infection, site not specified: Secondary | ICD-10-CM | POA: Diagnosis present

## 2011-07-29 HISTORY — DX: Depression, unspecified: F32.A

## 2011-07-29 HISTORY — DX: Shortness of breath: R06.02

## 2011-07-29 HISTORY — DX: Pneumonia, unspecified organism: J18.9

## 2011-07-29 HISTORY — DX: Major depressive disorder, single episode, unspecified: F32.9

## 2011-07-29 HISTORY — DX: Reserved for concepts with insufficient information to code with codable children: IMO0002

## 2011-07-29 LAB — URINALYSIS, ROUTINE W REFLEX MICROSCOPIC
Leukocytes, UA: NEGATIVE
Protein, ur: NEGATIVE mg/dL
Specific Gravity, Urine: 1.01 (ref 1.005–1.030)
Urobilinogen, UA: 0.2 mg/dL (ref 0.0–1.0)

## 2011-07-29 LAB — DIFFERENTIAL
Basophils Absolute: 0 10*3/uL (ref 0.0–0.1)
Lymphocytes Relative: 4 % — ABNORMAL LOW (ref 12–46)
Lymphs Abs: 0.4 10*3/uL — ABNORMAL LOW (ref 0.7–4.0)
Monocytes Absolute: 0.5 10*3/uL (ref 0.1–1.0)
Monocytes Relative: 4 % (ref 3–12)
Neutro Abs: 10 10*3/uL — ABNORMAL HIGH (ref 1.7–7.7)

## 2011-07-29 LAB — TROPONIN I: Troponin I: <0.3 ng/mL (ref <0.30)

## 2011-07-29 LAB — CBC
HCT: 42.8 % (ref 36.0–46.0)
Hemoglobin: 13.9 g/dL (ref 12.0–15.0)
RBC: 4.65 MIL/uL (ref 3.87–5.11)
RDW: 14.1 % (ref 11.5–15.5)
WBC: 10.9 10*3/uL — ABNORMAL HIGH (ref 4.0–10.5)

## 2011-07-29 LAB — COMPREHENSIVE METABOLIC PANEL
AST: 3979 U/L — ABNORMAL HIGH (ref 0–37)
Albumin: 3.7 g/dL (ref 3.5–5.2)
Alkaline Phosphatase: 147 U/L — ABNORMAL HIGH (ref 39–117)
BUN: 14 mg/dL (ref 6–23)
Creatinine, Ser: 0.84 mg/dL (ref 0.50–1.10)
Potassium: 3.9 mEq/L (ref 3.5–5.1)
Total Protein: 7.3 g/dL (ref 6.0–8.3)

## 2011-07-29 LAB — BILIRUBIN, TOTAL: Total Bilirubin: 1.4 mg/dL — ABNORMAL HIGH (ref 0.3–1.2)

## 2011-07-29 LAB — LACTIC ACID, PLASMA: Lactic Acid, Venous: 2.1 mmol/L (ref 0.5–2.2)

## 2011-07-29 LAB — BILIRUBIN, DIRECT: Bilirubin, Direct: 0.9 mg/dL — ABNORMAL HIGH (ref 0.0–0.3)

## 2011-07-29 LAB — LIPASE, BLOOD: Lipase: 30 U/L (ref 11–59)

## 2011-07-29 MED ORDER — ENOXAPARIN SODIUM 40 MG/0.4ML ~~LOC~~ SOLN
40.0000 mg | SUBCUTANEOUS | Status: DC
  Administered 2011-07-29 – 2011-08-04 (×7): 40 mg via SUBCUTANEOUS
  Filled 2011-07-29 (×7): qty 0.4

## 2011-07-29 MED ORDER — DEXTROSE 5 % IV SOLN
INTRAVENOUS | Status: AC
  Filled 2011-07-29: qty 10

## 2011-07-29 MED ORDER — SODIUM CHLORIDE 0.9 % IV SOLN
INTRAVENOUS | Status: DC
  Administered 2011-07-29: 13:00:00 via INTRAVENOUS

## 2011-07-29 MED ORDER — LEVOTHYROXINE SODIUM 25 MCG PO TABS
25.0000 ug | ORAL_TABLET | Freq: Every day | ORAL | Status: DC
  Administered 2011-07-31 – 2011-08-05 (×6): 25 ug via ORAL
  Filled 2011-07-29 (×7): qty 1

## 2011-07-29 MED ORDER — DEXTROSE-NACL 5-0.9 % IV SOLN
INTRAVENOUS | Status: DC
  Administered 2011-07-29: 60 mL/h via INTRAVENOUS

## 2011-07-29 MED ORDER — LORAZEPAM 0.5 MG PO TABS
0.5000 mg | ORAL_TABLET | Freq: Two times a day (BID) | ORAL | Status: DC
  Administered 2011-07-29 – 2011-08-05 (×13): 0.5 mg via ORAL
  Filled 2011-07-29 (×13): qty 1

## 2011-07-29 MED ORDER — KETOROLAC TROMETHAMINE 0.5 % OP SOLN
1.0000 [drp] | Freq: Two times a day (BID) | OPHTHALMIC | Status: DC
  Administered 2011-07-30 – 2011-08-04 (×11): 1 [drp] via OPHTHALMIC
  Filled 2011-07-29: qty 3

## 2011-07-29 MED ORDER — CHOLECALCIFEROL 10 MCG (400 UNIT) PO TABS
800.0000 [IU] | ORAL_TABLET | Freq: Every day | ORAL | Status: DC
  Administered 2011-07-31 – 2011-08-04 (×5): 800 [IU] via ORAL
  Filled 2011-07-29 (×9): qty 2

## 2011-07-29 MED ORDER — POLYETHYLENE GLYCOL 3350 17 G PO PACK
17.0000 g | PACK | Freq: Every day | ORAL | Status: DC
  Administered 2011-07-31 – 2011-08-05 (×6): 17 g via ORAL
  Filled 2011-07-29 (×6): qty 1

## 2011-07-29 MED ORDER — ACETAMINOPHEN 325 MG PO TABS
650.0000 mg | ORAL_TABLET | Freq: Once | ORAL | Status: AC
  Administered 2011-07-29: 650 mg via ORAL
  Filled 2011-07-29: qty 2

## 2011-07-29 MED ORDER — DEXTROSE 5 % IV SOLN
1.0000 g | INTRAVENOUS | Status: DC
  Administered 2011-07-29 – 2011-08-03 (×6): 1 g via INTRAVENOUS
  Filled 2011-07-29 (×8): qty 10

## 2011-07-29 MED ORDER — ADULT MULTIVITAMIN W/MINERALS CH
1.0000 | ORAL_TABLET | Freq: Every day | ORAL | Status: DC
  Administered 2011-07-31: 10:00:00 via ORAL
  Administered 2011-08-01 – 2011-08-05 (×5): 1 via ORAL
  Filled 2011-07-29 (×6): qty 1

## 2011-07-29 MED ORDER — PIPERACILLIN-TAZOBACTAM 4.5 G IVPB
4.5000 g | Freq: Once | INTRAVENOUS | Status: AC
  Administered 2011-07-29: 4.5 g via INTRAVENOUS
  Filled 2011-07-29: qty 100

## 2011-07-29 MED ORDER — ONDANSETRON HCL 4 MG/2ML IJ SOLN
4.0000 mg | INTRAMUSCULAR | Status: DC | PRN
  Administered 2011-07-29: 4 mg via INTRAVENOUS
  Filled 2011-07-29: qty 2

## 2011-07-29 MED ORDER — SODIUM CHLORIDE 0.9 % IV BOLUS (SEPSIS)
500.0000 mL | Freq: Once | INTRAVENOUS | Status: AC
  Administered 2011-07-29: 16:00:00 via INTRAVENOUS

## 2011-07-29 NOTE — H&P (Signed)
  [id number] 

## 2011-07-29 NOTE — ED Notes (Signed)
Vomiting, onset last night, Resident from [facility].

## 2011-07-29 NOTE — ED Notes (Signed)
Pt returned from rad. Depart.

## 2011-07-29 NOTE — H&P (Signed)
Kimberly [name], Kimberly [name]                ACCOUNT NO.:  [id number]  MEDICAL RECORD NO.:  [id number]  LOCATION:  [facility]                         FACILITY:  [facility]  PHYSICIAN:  Purcell Nails, MD DATE OF BIRTH:  08-10-1950  DATE OF ADMISSION:  07/29/2011 DATE OF DISCHARGE:  LH                             HISTORY & PHYSICAL   CHIEF COMPLAINT:  Nausea, vomiting, and diarrhea.  HISTORY OF PRESENT ILLNESS:  This is a 61 year old female patient with multiple medical problems including arthritis, anemia of chronic disease, chronic airway obstruction, chronic smoking, congestive heart failure, and hypothyroidism.  Nursing home resident.  The patient comes to the emergency room with a complaint of nausea, vomiting, and diarrhea for 1 day.  She was found to have a temperature of 102 and elevated liver enzymes, hence considered for in-hospital treatment.  The patient is a poor historian.  Her caretaker is not available at this point for interview; however, the patient is familiar to me as an outpatient treatment.  She denies chest pain.  No exposure to sick contacts.  She has not taken any over-the-counter anti-pain medications.  She had vomited 2-3 times, nonbilious, non bloody material.  She has a complaint of mild abdominal pain.  Denies any trauma.  No GI bleed reported.  PAST MEDICAL HISTORY:  As above.  PAST SURGICAL HISTORY:  Not remarkable.  SOCIAL HISTORY:  Chronic active smoking.  FAMILY HISTORY:  Unremarkable.  The patient is a poor historian.  REVIEW OF SYSTEMS:  As in as in HPI.  All others are reviewed and negative.  MEDICATIONS:  Her home medications include multivitamin, simvastatin, Lasix, Synthroid 25 mcg daily, Lorazepam 0.5 mg daily, and omeprazole 20 mg daily.  PHYSICAL EXAMINATION:  GENERAL:  She is alert, awake, in no acute distress, and oriented x3.  Hard of hearing. VITAL SIGNS:  Blood pressure is marginal at 93/59, temperature 102.1, pulse rate 75, and respiratory  rate 18. HEENT:  Atraumatic.  Anicteric sclerae.  Pink mucous membranes; however, dry. NECK:  Negative for JVD or thyromegaly. CHEST:  Clear to auscultation bilaterally. CARDIOVASCULAR:  Normal S1 and S2.  No murmur.  No gallop. ABDOMEN:  Significant for slight right upper quadrant tenderness.  No organomegaly. EXTREMITIES:  No edema. CNS:  Nonfocal. SKIN:  No rash.  No hyperemia.  LABORATORY DATA:  Her preliminary workup showed sodium 142, potassium 3.9, chloride 101, bicarb 31, BUN 14, creatinine 0.84, and calcium 10.3. AST significantly elevated at 3979, ALT also elevated at 3125, total protein 7.3, lipase 30 normal, albumin 3.7 normal, alkaline phosphatase slightly elevated at 147, and lactic acid at 2.1.  WBC count 10.9, hemoglobin 13.9, hematocrit 42.8, and platelets 299,000.  Blood glucose 142.  Urinalysis essentially negative.  Chest x-ray showed possible small right pleural effusion, otherwise no acute cardiopulmonary disease.  Urine culture was sent.  Abdominal ultrasound showed gallstones and sludge within the gallbladder, mild gallbladder wall thickening likely related to chronic cholecystitis.  Negative sonographic Murphy's.  ASSESSMENT: 1. Acute onset nausea, vomiting, and diarrhea. 2. Dehydration. 3. Chronic cholecystitis. 4. Hypothyroidism. 5. Nursing home resident. 6. Hyperlipidemia. 7. Transaminitis.  PLAN:  Keep the patient n.p.o. with dextrose support, normal saline, and  with D5 at 75 mL/h.  I will consult General Surgery to evaluate the patient.  I will put the patient on ceftriaxone 1 g IV daily while waiting for urine culture and will obtain blood cultures x2.  We will do hepatitis panel given acute transaminitis.  Will continue her Synthroid 25 mcg daily; however, hold simvastatin as well as Tylenol.  The patient will be re-evaluated after rehydration and possibly repeat chest x-ray to make sure she does not have intrathoracic infections.                                            ______________________________ Purcell Nails, MD     GN/MEDQ  D:  07/29/2011  T:  07/29/2011  Job:  [id number]

## 2011-07-29 NOTE — ED Provider Notes (Signed)
History     CSN: [id number]  Arrival date & time 07/29/11  1124   First MD Initiated Contact with Patient 07/29/11 1249      Chief Complaint  Patient presents with  . Emesis    Patient is a 61 y.o. female presenting with vomiting. The history is provided by the nursing home, the EMS personnel and the patient. History Limited By: HOH, poor historian   Emesis   Pt was seen at 1250.  Per NH report, EMS and pt, c/o gradual onset and persistence of multiple intermittent episodes of N/V/D since last night.  Denies back pain, no fevers, no black or blood in stools or emesis, no CP/SOB.    Past Medical History  Diagnosis Date  . COPD (chronic obstructive pulmonary disease)   . CHF (congestive heart failure)   . Anemia   . Arthritis     osteoarthritis  . Hypothyroidism   . HOH (hard of hearing)   . Pneumonia     Past Surgical History  Procedure Date  . Cesarean section   . Arm debridement     as child  . Strabismus surgery     age 85  . Cataract extraction w/phaco 04/28/2011    Procedure: CATARACT EXTRACTION PHACO AND INTRAOCULAR LENS PLACEMENT (IOC);  Surgeon: Susa Simmonds;  Location: [facility];  Service: Ophthalmology;  Laterality: Left;  CDE: 5.37    History  Substance Use Topics  . Smoking status: Current Everyday Smoker -- 0.5 packs/day  . Smokeless tobacco: Not on file  . Alcohol Use: No    Review of Systems  Unable to perform ROS: Other  Gastrointestinal: Positive for vomiting.    Allergies  Review of patient's allergies indicates no known allergies.  Home Medications   Current Outpatient Rx  Name Route Sig Dispense Refill  . ACETAMINOPHEN 325 MG PO TABS Oral Take 650 mg by mouth 3 (three) times daily.      . CHOLECALCIFEROL 400 UNITS PO TABS Oral Take 800 Units by mouth daily.      . FUROSEMIDE 20 MG PO TABS Oral Take 20 mg by mouth daily.      Marland Kitchen KETOROLAC TROMETHAMINE 0.4 % OP SOLN Left Eye Place 1 drop into the left eye 2 (two) times daily.     Marland Kitchen  LEVOTHYROXINE SODIUM 25 MCG PO TABS Oral Take 25 mcg by mouth daily.      Marland Kitchen LORAZEPAM 0.5 MG PO TABS Oral Take 0.5 mg by mouth 2 (two) times daily.      Carma Leaven M PLUS PO TABS Oral Take 1 tablet by mouth daily.      Marland Kitchen SIMVASTATIN 20 MG PO TABS Oral Take 20 mg by mouth every evening.    Marland Kitchen POLYETHYLENE GLYCOL 3350 PO PACK Oral Take 17 g by mouth daily. constipation       BP 93/61  Pulse 76  Temp(Src) 102.1 F (38.9 C) (Rectal)  Resp 18  SpO2 96%  Physical Exam 1255: Physical examination:  Nursing notes reviewed; Vital signs and O2 SAT reviewed;  Constitutional: Well developed, Well nourished, In no acute distress; Head:  Normocephalic, atraumatic; Eyes: EOMI, PERRL, No scleral icterus; ENMT: Mouth and pharynx normal, Mucous membranes dry; Neck: Supple, Full range of motion, No lymphadenopathy; Cardiovascular: Regular rate and rhythm, No murmur or gallop; Respiratory: Breath sounds clear & equal bilaterally, No rales, rhonchi, wheezes, or rub, Normal respiratory effort/excursion; Chest: Nontender, Movement normal; Abdomen: Soft, +mild RUQ and mid-epigastric abd tender to palp,  Nondistended, Normal bowel sounds; Extremities: Pulses normal, No tenderness, 1+ pedal edema bilat, No calf edema or asymmetry.; Neuro: Awake, alert, mildly confused re: events, +HOH, speech clear, no facial droop. Moves all ext on stretcher.; Skin: Color normal, Warm, Dry, no rash.    ED Course  Procedures    MDM  MDM Reviewed: nursing note, vitals and previous chart Reviewed previous: labs and ECG Interpretation: ECG, labs, x-ray and ultrasound    Date: 07/29/2011  Rate: 105  Rhythm: sinus tachycardia  QRS Axis: normal  Intervals: normal  ST/T Wave abnormalities: normal, artifact  Conduction Disutrbances:none  Narrative Interpretation:   Old EKG Reviewed: unchanged; no significant changes from previous EKG dated 06/01/2011.  Results for orders placed during the hospital encounter of 07/29/11  COMPREHENSIVE  METABOLIC PANEL      Component Value Range   Sodium 142  135 - 145 (mEq/L)   Potassium 3.9  3.5 - 5.1 (mEq/L)   Chloride 101  96 - 112 (mEq/L)   CO2 31  19 - 32 (mEq/L)   Glucose, Bld 142 (*) 70 - 99 (mg/dL)   BUN 14  6 - 23 (mg/dL)   Creatinine, Ser 1.61  0.50 - 1.10 (mg/dL)   Calcium 09.6  8.4 - 10.5 (mg/dL)   Total Protein 7.3  6.0 - 8.3 (g/dL)   Albumin 3.7  3.5 - 5.2 (g/dL)   AST 0454 (*) 0 - 37 (U/L)   ALT 3125 (*) 0 - 35 (U/L)   Alkaline Phosphatase 147 (*) 39 - 117 (U/L)   Total Bilirubin 1.4 (*) 0.3 - 1.2 (mg/dL)   GFR calc non Af Amer 73 (*) >90 (mL/min)   GFR calc Af Amer 85 (*) >90 (mL/min)  LIPASE, BLOOD      Component Value Range   Lipase 30  11 - 59 (U/L)  LACTIC ACID, PLASMA      Component Value Range   Lactic Acid, Venous 2.1  0.5 - 2.2 (mmol/L)  PROCALCITONIN      Component Value Range   Procalcitonin 6.98    URINALYSIS, ROUTINE W REFLEX MICROSCOPIC      Component Value Range   Color, Urine YELLOW  YELLOW    APPearance CLEAR  CLEAR    Specific Gravity, Urine 1.010  1.005 - 1.030    pH 6.5  5.0 - 8.0    Glucose, UA NEGATIVE  NEGATIVE (mg/dL)   Hgb urine dipstick NEGATIVE  NEGATIVE    Bilirubin Urine NEGATIVE  NEGATIVE    Ketones, ur NEGATIVE  NEGATIVE (mg/dL)   Protein, ur NEGATIVE  NEGATIVE (mg/dL)   Urobilinogen, UA 0.2  0.0 - 1.0 (mg/dL)   Nitrite NEGATIVE  NEGATIVE    Leukocytes, UA NEGATIVE  NEGATIVE   TROPONIN I      Component Value Range   Troponin I <0.30  <0.30 (ng/mL)  CBC      Component Value Range   WBC 10.9 (*) 4.0 - 10.5 (K/uL)   RBC 4.65  3.87 - 5.11 (MIL/uL)   Hemoglobin 13.9  12.0 - 15.0 (g/dL)   HCT 09.8  11.9 - 14.7 (%)   MCV 92.0  78.0 - 100.0 (fL)   MCH 29.9  26.0 - 34.0 (pg)   MCHC 32.5  30.0 - 36.0 (g/dL)   RDW 82.9  56.2 - 13.0 (%)   Platelets 293  150 - 400 (K/uL)  DIFFERENTIAL      Component Value Range   Neutrophils Relative 92 (*) 43 - 77 (%)  Neutro Abs 10.0 (*) 1.7 - 7.7 (K/uL)   Lymphocytes Relative 4 (*) 12 -  46 (%)   Lymphs Abs 0.4 (*) 0.7 - 4.0 (K/uL)   Monocytes Relative 4  3 - 12 (%)   Monocytes Absolute 0.5  0.1 - 1.0 (K/uL)   Eosinophils Relative 0  0 - 5 (%)   Eosinophils Absolute 0.0  0.0 - 0.7 (K/uL)   Basophils Relative 0  0 - 1 (%)   Basophils Absolute 0.0  0.0 - 0.1 (K/uL)   Dg Chest 1 View 07/29/2011  *RADIOLOGY REPORT*  Clinical Data: 61 year old female with emesis, weakness.  CHEST - 1 VIEW  Comparison: 07/16/2011 and earlier.  Findings: Seated AP view at 1321 hours.  Stable lung volumes. Stable cardiac size and mediastinal contours.  Visualized tracheal air column is within normal limits.  No pneumothorax or pneumoperitoneum identified.  Possible small right effusion. Otherwise the lungs are clear allowing for portable technique.  IMPRESSION: Possible small right pleural effusion, otherwise no acute cardiopulmonary abnormality.  No pneumoperitoneum identified.  Original Report Authenticated By: Harley Hallmark, M.D.    US Abdomen Complete 07/29/2011  *RADIOLOGY REPORT*  Clinical Data:  Elevated LFTs, nausea, vomiting.  COMPLETE ABDOMINAL ULTRASOUND  Comparison:  05/11/2009  Findings:  Gallbladder:  12 mm gallstone within the gallbladder.  Gallbladder wall slightly thickened at 3.4 mm.  Negative sonographic Murphy's. Small amount of sludge in the gallbladder.  Common bile duct:   Normal caliber, 6 mm.  Liver:  No focal lesion identified.  Within normal limits in parenchymal echogenicity.  IVC:  Appears normal.  Pancreas:  No focal abnormality seen.  Spleen:  Within normal limits in size and echotexture.  Right Kidney:   Normal in size and parenchymal echogenicity.  No evidence of mass or hydronephrosis.  Left Kidney:  Normal in size and parenchymal echogenicity.  No evidence of mass or hydronephrosis.  Abdominal aorta:  No aneurysm identified.  IMPRESSION: Gallstones and sludge within the gallbladder.  Mild gallbladder wall thickening, likely related to chronic cholecystitis.  Negative sonographic  Murphy's.  Original Report Authenticated By: Cyndie Chime, M.D.     Can.Bolognese:  Procalcitonin elevated but lactic acid normal.  UA without UTI, UC pending.  LFT's elevated, no old to compare.  [place] with chronic cholecystitis.  Abd re-exam benign, no tenderness to palp anywhere on abd.  T/C to Dr. Fransico Him, case discussed, including:  HPI, pertinent PM/SHx, VS/PE, dx testing, ED course and treatment:  Agreeable to admit, requests to speak with ED RN.     Laray Anger, DO 07/31/11 1511

## 2011-07-30 ENCOUNTER — Encounter (HOSPITAL_COMMUNITY): Payer: Self-pay | Admitting: Gastroenterology

## 2011-07-30 DIAGNOSIS — R740 Nonspecific elevation of levels of transaminase and lactic acid dehydrogenase [LDH]: Secondary | ICD-10-CM

## 2011-07-30 DIAGNOSIS — K7200 Acute and subacute hepatic failure without coma: Secondary | ICD-10-CM

## 2011-07-30 LAB — BASIC METABOLIC PANEL
BUN: 12 mg/dL (ref 6–23)
Chloride: 104 mEq/L (ref 96–112)
GFR calc Af Amer: >90 mL/min (ref >90)
Potassium: 3.4 mEq/L — ABNORMAL LOW (ref 3.5–5.1)
Sodium: 142 mEq/L (ref 135–145)

## 2011-07-30 LAB — T4, FREE: Free T4: 1.18 ng/dL (ref 0.80–1.80)

## 2011-07-30 LAB — HEPATIC FUNCTION PANEL
Alkaline Phosphatase: 122 U/L — ABNORMAL HIGH (ref 39–117)
Bilirubin, Direct: 1 mg/dL — ABNORMAL HIGH (ref 0.0–0.3)
Indirect Bilirubin: 0.3 mg/dL (ref 0.3–0.9)
Total Protein: 6.1 g/dL (ref 6.0–8.3)

## 2011-07-30 LAB — PROTIME-INR
INR: 1.06 (ref 0.00–1.49)
Prothrombin Time: 14 seconds (ref 11.6–15.2)

## 2011-07-30 LAB — CBC
HCT: 36.7 % (ref 36.0–46.0)
RDW: 14.2 % (ref 11.5–15.5)
WBC: 8.4 10*3/uL (ref 4.0–10.5)

## 2011-07-30 LAB — TSH: TSH: 0.26 u[IU]/mL — ABNORMAL LOW (ref 0.350–4.500)

## 2011-07-30 MED ORDER — BIOTENE DRY MOUTH MT LIQD
15.0000 mL | Freq: Two times a day (BID) | OROMUCOSAL | Status: DC
  Administered 2011-07-30 – 2011-08-05 (×12): 15 mL via OROMUCOSAL

## 2011-07-30 MED ORDER — POTASSIUM CHLORIDE CRYS ER 20 MEQ PO TBCR
40.0000 meq | EXTENDED_RELEASE_TABLET | Freq: Once | ORAL | Status: DC
  Filled 2011-07-30: qty 2

## 2011-07-30 MED ORDER — SODIUM CHLORIDE 0.9 % IJ SOLN
INTRAMUSCULAR | Status: AC
  Filled 2011-07-30: qty 3

## 2011-07-30 MED ORDER — BOOST / RESOURCE BREEZE PO LIQD
1.0000 | Freq: Three times a day (TID) | ORAL | Status: DC
  Administered 2011-07-30 – 2011-08-04 (×13): 1 via ORAL
  Filled 2011-07-30 (×21): qty 1

## 2011-07-30 MED ORDER — KCL IN DEXTROSE-NACL 20-5-0.45 MEQ/L-%-% IV SOLN
INTRAVENOUS | Status: DC
  Administered 2011-07-30 – 2011-08-04 (×7): via INTRAVENOUS

## 2011-07-30 NOTE — Progress Notes (Signed)
INITIAL ADULT NUTRITION ASSESSMENT Date: 07/30/2011   Time: 10:49 AM Reason for Assessment: Consult for malnutrition  ASSESSMENT: Female 61 y.o.  Dx: <principal problem not specified>  Hx:  Past Medical History  Diagnosis Date  . COPD (chronic obstructive pulmonary disease)   . CHF (congestive heart failure)   . Anemia   . Arthritis     osteoarthritis  . Hypothyroidism   . HOH (hard of hearing)   . Pneumonia   . Shortness of breath   . Depression   . DDD (degenerative disc disease)    Related Meds:  Scheduled Meds:   . acetaminophen  650 mg Oral Once  . antiseptic oral rinse  15 mL Mouth Rinse BID  . cefTRIAXone (ROCEPHIN)  IV  1 g Intravenous Q24H  . cholecalciferol  800 Units Oral Daily  . enoxaparin (LOVENOX) injection  40 mg Subcutaneous Q24H  . ketorolac  1 drop Left Eye BID  . levothyroxine  25 mcg Oral Daily  . LORazepam  0.5 mg Oral BID  . mulitivitamin with minerals  1 tablet Oral Daily  . piperacillin-tazobactam (ZOSYN)  IV  4.5 g Intravenous Once  . polyethylene glycol  17 g Oral Daily  . potassium chloride  40 mEq Oral Once  . sodium chloride  500 mL Intravenous Once  . sodium chloride       Continuous Infusions:   . dextrose 5 % and 0.9% NaCl 60 mL/hr (07/29/11 1912)  . DISCONTD: sodium chloride 75 mL/hr at 07/29/11 1318   PRN Meds:.ondansetron  Ht: 5\' 4"  (162.6 cm)  Wt: 101 lb 13.6 oz (46.2 kg)  Ideal Wt: 54.7 kg  % Ideal Wt: 84.1%  Usual Wt: unable to recall UBW  BMI: 17.33 Underweight  Food/Nutrition Related Hx: Patient stated no appetite. Reports vomiting prior to hospitalization. Reported has been eating small portions for greater than 1 month at home because there are a lot of people.   Labs:  CMP     Component Value Date/Time   NA 142 07/30/2011 0529   K 3.4* 07/30/2011 0529   CL 104 07/30/2011 0529   CO2 28 07/30/2011 0529   GLUCOSE 98 07/30/2011 0529   BUN 12 07/30/2011 0529   CREATININE 0.76 07/30/2011 0529   CALCIUM 9.3 07/30/2011 0529     PROT 6.1 07/30/2011 0529   ALBUMIN 3.0* 07/30/2011 0529   AST 1078* 07/30/2011 0529   ALT 1712* 07/30/2011 0529   ALKPHOS 122* 07/30/2011 0529   BILITOT 1.3* 07/30/2011 0529   GFRNONAA 89* 07/30/2011 0529   GFRAA >90 07/30/2011 0529     Intake/Output Summary (Last 24 hours) at 07/30/11 1052 Last data filed at 07/30/11 0900  Gross per 24 hour  Intake      0 ml  Output      0 ml  Net      0 ml    Diet Order: Clear Liquid  Supplements/Tube Feeding: none at this time  IVF:    dextrose 5 % and 0.9% NaCl Last Rate: 60 mL/hr (07/29/11 1912)  DISCONTD: sodium chloride Last Rate: 75 mL/hr at 07/29/11 1318    Estimated Nutritional Needs:   Kcal: 1150-1380 Protein: 55-69 grams Fluid: 1 ml per kcal  NUTRITION DIAGNOSIS: -Inadequate oral intake (NI-2.1).  Status: Ongoing  RELATED TO: poor appetite and clear liquid diet restriction  AS EVIDENCE BY: patient reports no appetite, vomiting, low BMI 17.33, and eating smaller portions for greater than 1 month.   MONITORING/EVALUATION(Goals): PO intake, diet advancement,  I/O's, weight trends 1. Meet > 90% of estimated energy needs 2. PO intake greater than 75% at meals and supplements 3.Promote weight gain/ Prevent weight loss  EDUCATION NEEDS: -No education needs identified at this time  INTERVENTION: 1. Will order Resource Breeze TID. Provides 750 kcal and 27 grams of protein daily.  2. RD to follow for nutrition needs.  Dietitian [phone]  DOCUMENTATION CODES Per approved criteria  - Underweight    Iven Finn [facility] 07/30/2011, 10:49 AM

## 2011-07-30 NOTE — Consult Note (Signed)
Referring Provider: Dr. Felecia Shelling Primary Care Physician:  Avon Gully, MD, MD Primary Gastroenterologist:  Dr. Jena Gauss  Date of Admission: 07/29/11 Date of Consultation: 07/30/11  Reason for Consultation:  Elevated LFTs   HPI:  Ms. Snead is a 61 year old female who resides in a nursing home. No one is present in room with pt at time of visit, and she is a poor historian. I was able to gather some information from the chart and the nurse. Pt does state she has 2 daughters that "don't care about me".   Presented to ED with N/V/D X 1 day on 07/29/11, per medical records. Temp 102 on admission, significantly elevated transaminases with AST 3979 and ALT 3125. Mild bump in Tbili at 1.4, direct 0.9. Lipase normal at 30. [place] of abd performed with gallstones, CBD normal. Blood cultures, preliminary report with gram negative rods.    At time of consultation, pt denies pain. Unsure where she is or why she is here. +nausea, no vomiting. Wants coffee. Per RN today, states no diarrhea since admission, no vomiting. Pt has bouts of clarity and actually informed RN this morning that she was here because of her gallbladder. I was unable to illicit any detailed information from patient at time of consultation. She is dozing off and on but easily awakens.    As of note, per records in past, dealt with constipation. Last TCS by Dr. Jena Gauss in Jan 2011 due to concerns for colovesicular fistula. Findings under PSH, essentially normal.    Past Medical History  Diagnosis Date  . COPD (chronic obstructive pulmonary disease)   . CHF (congestive heart failure)   . Anemia   . Arthritis     osteoarthritis  . Hypothyroidism   . HOH (hard of hearing)   . Pneumonia   . Shortness of breath   . Depression   . DDD (degenerative disc disease)     Past Surgical History  Procedure Date  . Cesarean section   . Arm debridement     as child  . Strabismus surgery     age 19  . Cataract extraction w/phaco 04/28/2011   Procedure: CATARACT EXTRACTION PHACO AND INTRAOCULAR LENS PLACEMENT (IOC);  Surgeon: Susa Simmonds;  Location: [facility];  Service: Ophthalmology;  Laterality: Left;  CDE: 5.37  . Colonoscopy 06/05/2009    RMR: nl rectum, left-sided diverticula, colonoscopy performed due to question of colovesicular fistula     Prior to Admission medications   Medication Sig Start Date End Date Taking? Authorizing Provider  acetaminophen (TYLENOL) 325 MG tablet Take 650 mg by mouth 3 (three) times daily.     Yes Historical Provider, MD  cholecalciferol (VITAMIN D) 400 UNITS TABS Take 800 Units by mouth daily.     Yes Historical Provider, MD  furosemide (LASIX) 20 MG tablet Take 20 mg by mouth daily.     Yes Historical Provider, MD  ketorolac (ACULAR) 0.4 % SOLN Place 1 drop into the left eye 2 (two) times daily.    Yes Historical Provider, MD  levothyroxine (SYNTHROID, LEVOTHROID) 25 MCG tablet Take 25 mcg by mouth daily.     Yes Historical Provider, MD  LORazepam (ATIVAN) 0.5 MG tablet Take 0.5 mg by mouth 2 (two) times daily.     Yes Historical Provider, MD  Multiple Vitamins-Minerals (MULTIVITAMINS THER. W/MINERALS) TABS Take 1 tablet by mouth daily.     Yes Historical Provider, MD  simvastatin (ZOCOR) 20 MG tablet Take 20 mg by mouth every evening.   Yes  Historical Provider, MD  polyethylene glycol (MIRALAX / GLYCOLAX) packet Take 17 g by mouth daily. constipation     Historical Provider, MD    Current Facility-Administered Medications  Medication Dose Route Frequency Provider Last Rate Last Dose  . acetaminophen (TYLENOL) tablet 650 mg  650 mg Oral Once Laray Anger, DO   650 mg at 07/29/11 1356  . antiseptic oral rinse (BIOTENE) solution 15 mL  15 mL Mouth Rinse BID Avon Gully, MD      . cefTRIAXone (ROCEPHIN) 1 g in dextrose 5 % 50 mL IVPB  1 g Intravenous Q24H Purcell Nails, MD   1 g at 07/29/11 1912  . cholecalciferol (VITAMIN D) tablet 800 Units  800 Units Oral Daily Purcell Nails,  MD      . dextrose 5 %-0.9 % sodium chloride infusion   Intravenous Continuous Purcell Nails, MD 60 mL/hr at 07/29/11 1912 60 mL/hr at 07/29/11 1912  . enoxaparin (LOVENOX) injection 40 mg  40 mg Subcutaneous Q24H Purcell Nails, MD   40 mg at 07/29/11 1912  . ketorolac (ACULAR) 0.5 % ophthalmic solution 1 drop  1 drop Left Eye BID Purcell Nails, MD      . levothyroxine (SYNTHROID, LEVOTHROID) tablet 25 mcg  25 mcg Oral Daily Purcell Nails, MD      . LORazepam (ATIVAN) tablet 0.5 mg  0.5 mg Oral BID Purcell Nails, MD   0.5 mg at 07/29/11 2147  . mulitivitamin with minerals tablet 1 tablet  1 tablet Oral Daily Purcell Nails, MD      . ondansetron (ZOFRAN) injection 4 mg  4 mg Intravenous Q1H PRN Laray Anger, DO   4 mg at 07/29/11 1318  . piperacillin-tazobactam (ZOSYN) IVPB 4.5 g  4.5 g Intravenous Once Laray Anger, DO   4.5 g at 07/29/11 1643  . polyethylene glycol (MIRALAX / GLYCOLAX) packet 17 g  17 g Oral Daily Purcell Nails, MD      . potassium chloride SA (K-DUR,KLOR-CON) CR tablet 40 mEq  40 mEq Oral Once Avon Gully, MD      . sodium chloride 0.9 % bolus 500 mL  500 mL Intravenous Once Laray Anger, DO      . sodium chloride 0.9 % injection           . DISCONTD: 0.9 %  sodium chloride infusion   Intravenous Continuous Purcell Nails, MD 75 mL/hr at 07/29/11 1318      Allergies as of 07/29/2011  . (No Known Allergies)    Family History  Problem Relation Age of Onset  . Colon cancer      neg hx?     History   Social History  . Marital Status: Divorced    Spouse Name: N/A    Number of Children: N/A  . Years of Education: N/A   Occupational History  . Not on file.   Social History Main Topics  . Smoking status: Current Everyday Smoker -- 0.5 packs/day    Types: Cigarettes  . Smokeless tobacco: Not on file  . Alcohol Use: No  . Drug Use: No  . Sexually Active: No   Other Topics Concern  . Not on file    Social History Narrative  . No narrative on file    Review of Systems: difficult to obtain Gen: Denies loss of appetite, change in weight or weight loss CV: Denies chest pain, heart palpitations, syncope, edema  Resp: Denies shortness of breath with rest, cough, wheezing GI: Denies  dysphagia or odynophagia. Denies vomiting blood, jaundice, and fecal incontinence.  GU : Denies urinary burning, urinary frequency, urinary incontinence.  MS: Denies joint pain,swelling, cramping Derm: Denies rash, itching, dry skin Psych: Denies depression, anxiety,confusion, or memory loss Heme: Denies bruising, bleeding, and enlarged lymph nodes.  Physical Exam: Vital signs in last 24 hours: Temp:  [98.3 F (36.8 C)-102.1 F (38.9 C)] 99.2 F (37.3 C) (03/06 0500) Pulse Rate:  [64-122] 91  (03/06 0351) Resp:  [16-18] 16  (03/06 0351) BP: (92-114)/(56-72) 102/68 mmHg (03/06 0351) SpO2:  [89 %-96 %] 94 % (03/06 0351) Weight:  [101 lb 13.6 oz (46.2 kg)] 101 lb 13.6 oz (46.2 kg) (03/05 1824) Last BM Date: 07/28/11 General:  Drowsy but easily awakens.  Head:  Normocephalic and atraumatic. Eyes:  Sclera clear, no icterus.   Conjunctiva pink. Ears:  HOH Nose:  No deformity, discharge,  or lesions. Mouth:  No deformity or lesions Neck:  Supple; no masses or thyromegaly. Lungs:  Clear throughout to auscultation.   No wheezes, crackles, or rhonchi. No acute distress. Heart:  Regular rate and rhythm; no murmurs, clicks, rubs,  or gallops. Abdomen:  Soft, mildly TTP epigastric and RUQ, no rebound or guarding, and nondistended. No masses, hepatosplenomegaly or hernias noted. Normal bowel sounds Rectal:  Deferred  Msk:  Symmetrical without gross deformities. Normal posture. Pulses:  Normal pulses noted. Extremities:  Without clubbing or edema. Neurologic:  Alert and  oriented to person only at time of consultation.  Skin:  Intact without significant lesions or rashes. Cervical Nodes:  No significant  cervical adenopathy. Psych:  Flat affect, cooperative when awake.   Intake/Output from previous day:   Intake/Output this shift:    Lab Results:  Basename 07/30/11 0529 07/29/11 1310  WBC 8.4 10.9*  HGB 12.2 13.9  HCT 36.7 42.8  PLT 241 293   BMET  Basename 07/30/11 0529 07/29/11 1310  NA 142 142  K 3.4* 3.9  CL 104 101  CO2 28 31  GLUCOSE 98 142*  BUN 12 14  CREATININE 0.76 0.84  CALCIUM 9.3 10.3   LFT  Basename 07/30/11 0529 07/29/11 1310  PROT 6.1 7.3  ALBUMIN 3.0* 3.7  AST 1078* 3979*  ALT 1712* 3125*  ALKPHOS 122* 147*  BILITOT 1.3* 1.4*1.4*  BILIDIR 1.0* 0.9*  IBILI 0.3 --    Studies/Results: Dg Chest 1 View  07/29/2011  *RADIOLOGY REPORT*  Clinical Data: 61 year old female with emesis, weakness.  CHEST - 1 VIEW  Comparison: 07/16/2011 and earlier.  Findings: Seated AP view at 1321 hours.  Stable lung volumes. Stable cardiac size and mediastinal contours.  Visualized tracheal air column is within normal limits.  No pneumothorax or pneumoperitoneum identified.  Possible small right effusion. Otherwise the lungs are clear allowing for portable technique.  IMPRESSION: Possible small right pleural effusion, otherwise no acute cardiopulmonary abnormality.  No pneumoperitoneum identified.  Original Report Authenticated By: Harley Hallmark, M.D.   US Abdomen Complete  07/29/2011  *RADIOLOGY REPORT*  Clinical Data:  Elevated LFTs, nausea, vomiting.  COMPLETE ABDOMINAL ULTRASOUND  Comparison:  05/11/2009  Findings:  Gallbladder:  12 mm gallstone within the gallbladder.  Gallbladder wall slightly thickened at 3.4 mm.  Negative sonographic Murphy's. Small amount of sludge in the gallbladder.  Common bile duct:   Normal caliber, 6 mm.  Liver:  No focal lesion identified.  Within normal limits in parenchymal echogenicity.  IVC:  Appears normal.  Pancreas:  No focal abnormality seen.  Spleen:  Within normal limits  in size and echotexture.  Right Kidney:   Normal in size and  parenchymal echogenicity.  No evidence of mass or hydronephrosis.  Left Kidney:  Normal in size and parenchymal echogenicity.  No evidence of mass or hydronephrosis.  Abdominal aorta:  No aneurysm identified.  IMPRESSION: Gallstones and sludge within the gallbladder.  Mild gallbladder wall thickening, likely related to chronic cholecystitis.  Negative sonographic Murphy's.  Original Report Authenticated By: Cyndie Chime, M.D.    Impression: 61 year old female, poor historian, admitted from nursing home with N/V/D and temp of 102, significantly elevated LFTs, [place] of abd with chronic cholecystitis and CBD normal at 6mm. Transaminases have trended down since first drawn, no baseline LFTs to compare.Tbili also mildly elevated on admission, trending down as well. Physical exam with mild TTP RUQ, pt does not appear in acute pain. Difficult to obtain hx due to confusion. No further vomiting or diarrhea since admission. Question of shock liver at this point, precipitated by acute N/V/D. Complete hx is difficult to obtain as patient is poor historian. Less likely biliary component. Pt has been placed on empiric abx therapy, urine culture pending, blood cultures preliminary with gram negative rods.   Agree with holding simvastatin at this point.   Plan: HFP in am Continue supportive measures Clear liquids today Check PT/INR today   LOS: 1 day   Gerrit Halls  07/30/2011, 9:12 AM    Pt seen and examined; agree with above assessment and recommendations.  I suspect marked elevation in LFTs most consistent with shock liver in the setting of gastroenteritis and bacteremia. It is worth noting she was hypotensive on presentation to the ED.  No history of toxic exposure.  If bump in LFTs is related to ischemia, we should see aminotransferasecontinue to plummet, perhaps to have the values they are today when rechecked tomorrow morning.  Degree of aminotransferase elevation out of proportion to what is usually seen in  a setting of a common duct stone. I am not sure gallbladder is causing her any problems at this point in time.

## 2011-07-30 NOTE — Progress Notes (Signed)
07/30/11 1418 Assisted patient up to bedside commode this afternoon, pt appeared to have vision difficulty and unable to focus/see bedside commode or bedside table. Patient is confused at times but stated "i can see shadows and lights, been that way since i was a child". Stated "i can see you a little bit". Has history of cataracts and recent cataract surgery per patient. Neuro checks within normal limits. Notified Dr Felecia Shelling of vision difficulty, unsure of onset. Stated to speak with patient's brother to confirm onset. 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 bedalarm on for safety, patient demonstrated correct use of call light, nursing to monitor.

## 2011-07-30 NOTE — Progress Notes (Signed)
Kimberly [name], Kimberly [name]                ACCOUNT NO.:  [id number]  MEDICAL RECORD NO.:  [id number]  LOCATION:  A305                          FACILITY:  [facility]  PHYSICIAN:  Mennie Spiller D. Felecia Shelling, MD   DATE OF BIRTH:  1950/06/16  DATE OF PROCEDURE:  07/30/2011 DATE OF DISCHARGE:                                PROGRESS NOTE   SUBJECTIVE:  The patient does not feel well.  She is still complaining of abdominal discomfort.  OBJECTIVE:  GENERAL:  The patient is alert, awake, chronically sick looking. VITAL SIGNS:  Blood pressure 114/72, pulse 122, respiratory rate 18, temperature between 98.3 and 102.1. CHEST:  Clear lung field.  Good air entry. CARDIOVASCULAR SYSTEM:  First and second heart sounds heard.  No murmur. No gallop. ABDOMEN:  Soft and lax.  Bowel sound is positive.  No marked tenderness. EXTREMITIES:  No leg edema.  LABORATORY DATA:  The patient is growing gram-negative in her blood. CBC:  WBC 8.4, hemoglobin 12.1, hematocrit 36.7, and platelets 241. BMP:  Sodium 142, potassium 3.4 chloride 104, carbon dioxide 26, glucose 98, BUN 12, creatinine 0.7, and calcium 9.3, total protein 6.1, AST 1078, ALT 1712, alkaline phosphatase 122.  ASSESSMENT: 1. Acute cholecystitis with cholelithiasis. 2. History of hypothyroidism. 3. Dehydration. 4. Elevated LFT.  PLAN:  We will continue the patient on IV antibiotics.  Surgical consult is pending.  We will continue to monitor her BMP and LFTs.     Orbin Mayeux D. Felecia Shelling, MD     TDF/MEDQ  D:  07/30/2011  T:  07/30/2011  Job:  [id number]

## 2011-07-30 NOTE — Progress Notes (Signed)
Notified MD of blood cultures that were positive for gram negative rods.  No new orders received.  Will notify primary care doctor.

## 2011-07-30 NOTE — Progress Notes (Signed)
Patient admitted from [facility] and plans to return at d/c. Patient sitting up eating her clear liquids lunch- CSW Psychosocial Assessment and FL2 placed in shadow chart in [facility].  Reece Levy, MSW, Theresia Majors [phone]

## 2011-07-30 NOTE — Progress Notes (Signed)
07/30/11 1110 Patient had order for KCL 40 meq po x 1 for this morning, unable to take anything by mouth d/t feeling "sick" and being NPO at this time. Attempted to reach Dr Felecia Shelling multiple pages. On return call, notified him patient unable to take po meds at this time d/t being sleepy, and intermittent nausea. Received orders to d/c po KCL supplement and add potassium to IVF. Orders placed as given. Nursing to monitor.

## 2011-07-30 NOTE — Consult Note (Signed)
Reason for Consult: Cholelithiasis Referring Physician: Dr. Jaynie Sandberg is an 61 y.o. female.  HPI: Patient presented from an extended care facility with a history of right upper quadrant abdominal pain. Unfortunately patient is an extremely poor historian so the majority of the history is obtained from the patient's chart. Apparently the patient was having recurrent abdominal pain and presented from her extended care facility. Pain is been fairly persistent. It is unclear whether patient has had similar symptomatology in the past. No reported history of jaundice. No significant change in bowel history. When asked patient states her pain is better today. She denies any nausea with clear liquids. She is tolerating clear liquid diet. She denies any fevers or chills.  Past Medical History  Diagnosis Date  . COPD (chronic obstructive pulmonary disease)   . CHF (congestive heart failure)   . Anemia   . Arthritis     osteoarthritis  . Hypothyroidism   . HOH (hard of hearing)   . Pneumonia   . Shortness of breath   . Depression   . DDD (degenerative disc disease)     Past Surgical History  Procedure Date  . Cesarean section   . Arm debridement     as child  . Strabismus surgery     age 89  . Cataract extraction w/phaco 04/28/2011    Procedure: CATARACT EXTRACTION PHACO AND INTRAOCULAR LENS PLACEMENT (IOC);  Surgeon: Susa Simmonds;  Location: [facility];  Service: Ophthalmology;  Laterality: Left;  CDE: 5.37  . 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 Colon cancer      neg hx?     Social History:  reports that she has been smoking Cigarettes.  She has been smoking about .5 packs per day. She does not have any smokeless tobacco history on file. She reports that she does not drink alcohol or use illicit drugs.  Allergies: No Known  Allergies  Medications:  I have reviewed the patient's current medications. Prior to Admission:  Prescriptions prior to admission  Medication Sig Dispense Refill  . acetaminophen (TYLENOL) 325 MG tablet Take 650 mg by mouth 3 (three) times daily.        . cholecalciferol (VITAMIN D) 400 UNITS TABS Take 800 Units by mouth daily.        . furosemide (LASIX) 20 MG tablet Take 20 mg by mouth daily.        Marland Kitchen ketorolac (ACULAR) 0.4 % SOLN Place 1 drop into the left eye 2 (two) times daily.       Marland Kitchen levothyroxine (SYNTHROID, LEVOTHROID) 25 MCG tablet Take 25 mcg by mouth daily.        Marland Kitchen LORazepam (ATIVAN) 0.5 MG tablet Take 0.5 mg by mouth 2 (two) times daily.        . Multiple Vitamins-Minerals (MULTIVITAMINS THER. W/MINERALS) TABS Take 1 tablet by mouth daily.        . simvastatin (ZOCOR) 20 MG tablet Take 20 mg by mouth every evening.      . polyethylene glycol (MIRALAX / GLYCOLAX) packet Take 17 g by mouth daily. constipation        Scheduled:   . antiseptic oral rinse  15 mL Mouth Rinse BID  . cefTRIAXone (ROCEPHIN)  IV  1 g Intravenous Q24H  . cholecalciferol  800 Units Oral Daily  . enoxaparin (  LOVENOX) injection  40 mg Subcutaneous Q24H  . feeding supplement  1 Container Oral TID WC  . ketorolac  1 drop Left Eye BID  . levothyroxine  25 mcg Oral Daily  . LORazepam  0.5 mg Oral BID  . mulitivitamin with minerals  1 tablet Oral Daily  . piperacillin-tazobactam (ZOSYN)  IV  4.5 g Intravenous Once  . polyethylene glycol  17 g Oral Daily  . sodium chloride  500 mL Intravenous Once  . sodium chloride      . DISCONTD: potassium chloride  40 mEq Oral Once   Continuous:   . dextrose 5 % and 0.45 % NaCl with KCl 20 mEq/L 75 mL/hr at 07/30/11 1133  . DISCONTD: sodium chloride 75 mL/hr at 07/29/11 1318  . DISCONTD: dextrose 5 % and 0.9% NaCl 60 mL/hr (07/29/11 1912)   WUJ:[id number]  Results for orders placed during the hospital encounter of 07/29/11 (from the past 48 hour(s))   COMPREHENSIVE METABOLIC PANEL     Status: Abnormal   Collection Time   07/29/11  1:10 PM      Component Value Range Comment   Sodium 142  135 - 145 (mEq/L)    Potassium 3.9  3.5 - 5.1 (mEq/L)    Chloride 101  96 - 112 (mEq/L)    CO2 31  19 - 32 (mEq/L)    Glucose, Bld 142 (*) 70 - 99 (mg/dL)    BUN 14  6 - 23 (mg/dL)    Creatinine, Ser 6.21  0.50 - 1.10 (mg/dL)    Calcium 30.8  8.4 - 10.5 (mg/dL)    Total Protein 7.3  6.0 - 8.3 (g/dL)    Albumin 3.7  3.5 - 5.2 (g/dL)    AST 6578 (*) 0 - 37 (U/L)    ALT 3125 (*) 0 - 35 (U/L)    Alkaline Phosphatase 147 (*) 39 - 117 (U/L)    Total Bilirubin 1.4 (*) 0.3 - 1.2 (mg/dL)    GFR calc non Af Amer 73 (*) >90 (mL/min)    GFR calc Af Amer 85 (*) >90 (mL/min)   LIPASE, BLOOD     Status: Normal   Collection Time   07/29/11  1:10 PM      Component Value Range Comment   Lipase 30  11 - 59 (U/L)   LACTIC ACID, PLASMA     Status: Normal   Collection Time   07/29/11  1:10 PM      Component Value Range Comment   Lactic Acid, Venous 2.1  0.5 - 2.2 (mmol/L)   TROPONIN I     Status: Normal   Collection Time   07/29/11  1:10 PM      Component Value Range Comment   Troponin I <0.30  <0.30 (ng/mL)   CBC     Status: Abnormal   Collection Time   07/29/11  1:10 PM      Component Value Range Comment   WBC 10.9 (*) 4.0 - 10.5 (K/uL)    RBC 4.65  3.87 - 5.11 (MIL/uL)    Hemoglobin 13.9  12.0 - 15.0 (g/dL)    HCT 46.9  62.9 - 52.8 (%)    MCV 92.0  78.0 - 100.0 (fL)    MCH 29.9  26.0 - 34.0 (pg)    MCHC 32.5  30.0 - 36.0 (g/dL)    RDW 41.3  24.4 - 01.0 (%)    Platelets 293  150 - 400 (K/uL)   DIFFERENTIAL  Status: Abnormal   Collection Time   07/29/11  1:10 PM      Component Value Range Comment   Neutrophils Relative 92 (*) 43 - 77 (%)    Neutro Abs 10.0 (*) 1.7 - 7.7 (K/uL)    Lymphocytes Relative 4 (*) 12 - 46 (%)    Lymphs Abs 0.4 (*) 0.7 - 4.0 (K/uL)    Monocytes Relative 4  3 - 12 (%)    Monocytes Absolute 0.5  0.1 - 1.0 (K/uL)    Eosinophils  Relative 0  0 - 5 (%)    Eosinophils Absolute 0.0  0.0 - 0.7 (K/uL)    Basophils Relative 0  0 - 1 (%)    Basophils Absolute 0.0  0.0 - 0.1 (K/uL)   BILIRUBIN, TOTAL     Status: Abnormal   Collection Time   07/29/11  1:10 PM      Component Value Range Comment   Total Bilirubin 1.4 (*) 0.3 - 1.2 (mg/dL)   BILIRUBIN, DIRECT     Status: Abnormal   Collection Time   07/29/11  1:10 PM      Component Value Range Comment   Bilirubin, Direct 0.9 (*) 0.0 - 0.3 (mg/dL)   PROCALCITONIN     Status: Normal   Collection Time   07/29/11  1:11 PM      Component Value Range Comment   Procalcitonin 6.98     CULTURE, BLOOD (ROUTINE X 2)     Status: Normal (Preliminary result)   Collection Time   07/29/11  1:11 PM      Component Value Range Comment   Specimen Description BLOOD LEFT ANTECUBITAL      Special Requests BOTTLES DRAWN AEROBIC AND ANAEROBIC 8CC      Culture        Value: GRAM NEGATIVE RODS     Gram Stain Report Called to,Read Back By and Verified With: Lowella Dell RN ON [id number] AT 0620 BY RESSEGGER R   Report Status PENDING     URINALYSIS, ROUTINE W REFLEX MICROSCOPIC     Status: Normal   Collection Time   07/29/11  1:43 PM      Component Value Range Comment   Color, Urine YELLOW  YELLOW     APPearance CLEAR  CLEAR     Specific Gravity, Urine 1.010  1.005 - 1.030     pH 6.5  5.0 - 8.0     Glucose, UA NEGATIVE  NEGATIVE (mg/dL)    Hgb urine dipstick NEGATIVE  NEGATIVE     Bilirubin Urine NEGATIVE  NEGATIVE     Ketones, ur NEGATIVE  NEGATIVE (mg/dL)    Protein, ur NEGATIVE  NEGATIVE (mg/dL)    Urobilinogen, UA 0.2  0.0 - 1.0 (mg/dL)    Nitrite NEGATIVE  NEGATIVE     Leukocytes, UA NEGATIVE  NEGATIVE  MICROSCOPIC NOT DONE ON URINES WITH NEGATIVE PROTEIN, BLOOD, LEUKOCYTES, NITRITE, OR GLUCOSE <1000 mg/dL.  CULTURE, BLOOD (ROUTINE X 2)     Status: Normal (Preliminary result)   Collection Time   07/29/11  5:59 PM      Component Value Range Comment   Specimen Description BLOOD LEFT HAND       Special Requests BOTTLES DRAWN AEROBIC AND ANAEROBIC 5CC      Culture NO GROWTH 1 DAY      Report Status PENDING     HEPATIC FUNCTION PANEL     Status: Abnormal   Collection Time   07/30/11  5:29 AM  Component Value Range Comment   Total Protein 6.1  6.0 - 8.3 (g/dL)    Albumin 3.0 (*) 3.5 - 5.2 (g/dL)    AST 4540 (*) 0 - 37 (U/L)    ALT 1712 (*) 0 - 35 (U/L)    Alkaline Phosphatase 122 (*) 39 - 117 (U/L)    Total Bilirubin 1.3 (*) 0.3 - 1.2 (mg/dL)    Bilirubin, Direct 1.0 (*) 0.0 - 0.3 (mg/dL)    Indirect Bilirubin 0.3  0.3 - 0.9 (mg/dL)   CBC     Status: Normal   Collection Time   07/30/11  5:29 AM      Component Value Range Comment   WBC 8.4  4.0 - 10.5 (K/uL)    RBC 4.01  3.87 - 5.11 (MIL/uL)    Hemoglobin 12.2  12.0 - 15.0 (g/dL)    HCT 98.1  19.1 - 47.8 (%)    MCV 91.5  78.0 - 100.0 (fL)    MCH 30.4  26.0 - 34.0 (pg)    MCHC 33.2  30.0 - 36.0 (g/dL)    RDW 29.5  62.1 - 30.8 (%)    Platelets 241  150 - 400 (K/uL)   BASIC METABOLIC PANEL     Status: Abnormal   Collection Time   07/30/11  5:29 AM      Component Value Range Comment   Sodium 142  135 - 145 (mEq/L)    Potassium 3.4 (*) 3.5 - 5.1 (mEq/L)    Chloride 104  96 - 112 (mEq/L)    CO2 28  19 - 32 (mEq/L)    Glucose, Bld 98  70 - 99 (mg/dL)    BUN 12  6 - 23 (mg/dL)    Creatinine, Ser 6.57  0.50 - 1.10 (mg/dL)    Calcium 9.3  8.4 - 10.5 (mg/dL)    GFR calc non Af Amer 89 (*) >90 (mL/min)    GFR calc Af Amer >90  >90 (mL/min)   T4, FREE     Status: Normal   Collection Time   07/30/11  5:29 AM      Component Value Range Comment   Free T4 1.18  0.80 - 1.80 (ng/dL)   PROTIME-INR     Status: Normal   Collection Time   07/30/11 12:35 PM      Component Value Range Comment   Prothrombin Time 14.0  11.6 - 15.2 (seconds)    INR 1.06  0.00 - 1.49      Dg Chest 1 View  07/29/2011  *RADIOLOGY REPORT*  Clinical Data: 61 year old female with emesis, weakness.  CHEST - 1 VIEW  Comparison: 07/16/2011 and earlier.  Findings:  Seated [facility] view at 1321 hours.  Stable lung volumes. Stable cardiac size and mediastinal contours.  Visualized tracheal air column is within normal limits.  No pneumothorax or pneumoperitoneum identified.  Possible small right effusion. Otherwise the lungs are clear allowing for portable technique.  IMPRESSION: Possible small right pleural effusion, otherwise no acute cardiopulmonary abnormality.  No pneumoperitoneum identified.  Original Report Authenticated By: Harley Hallmark, M.D.   US Abdomen Complete  07/29/2011  *RADIOLOGY REPORT*  Clinical Data:  Elevated LFTs, nausea, vomiting.  COMPLETE ABDOMINAL ULTRASOUND  Comparison:  05/11/2009  Findings:  Gallbladder:  12 mm gallstone within the gallbladder.  Gallbladder wall slightly thickened at 3.4 mm.  Negative sonographic Murphy's. Small amount of sludge in the gallbladder.  Common bile duct:   Normal caliber, 6 mm.  Liver:  No focal  lesion identified.  Within normal limits in parenchymal echogenicity.  IVC:  Appears normal.  Pancreas:  No focal abnormality seen.  Spleen:  Within normal limits in size and echotexture.  Right Kidney:   Normal in size and parenchymal echogenicity.  No evidence of mass or hydronephrosis.  Left Kidney:  Normal in size and parenchymal echogenicity.  No evidence of mass or hydronephrosis.  Abdominal aorta:  No aneurysm identified.  IMPRESSION: Gallstones and sludge within the gallbladder.  Mild gallbladder wall thickening, likely related to chronic cholecystitis.  Negative sonographic Murphy's.  Original Report Authenticated By: Cyndie Chime, M.D.    Review of Systems  Unable to perform ROS  Blood pressure 99/63, pulse 74, temperature 99.9 F (37.7 C), temperature source Oral, resp. rate 18, height 5\' 4"  (1.626 m), weight 46.2 kg (101 lb 13.6 oz), SpO2 96.00%. Physical Exam  Constitutional:       Somewhat cachectic, disheveled, lying in a lateral decubitus position.  HENT:  Head: Normocephalic and atraumatic.  Eyes:  Conjunctivae are normal. Pupils are equal, round, and reactive to light. No scleral icterus.  Neck: Normal range of motion. No tracheal deviation present. No thyromegaly present.  Cardiovascular: Normal rate, regular rhythm and normal heart sounds.   Respiratory: Effort normal and breath sounds normal. No respiratory distress.  GI: Soft. Bowel sounds are normal. She exhibits no distension and no mass. There is tenderness (mild to moderate right upper quadrant abdominal tenderness. No classic Murphy sign elicited. No diffuse peritoneal signs.). There is no rebound and no guarding.  Lymphadenopathy:    She has no cervical adenopathy.  Neurological:       Extremely poor historian, awake, moderately confused  Skin: Skin is warm and dry.    Assessment/Plan: Right upper quadrant abdominal pain. Cholelithiasis. Elevated liver enzymes. At this point her symptomatology could represent an acute cholecystitis although her exam and presentation are less suspicious of this. I be more suspicious of her symptomatology coming from the liver etiology at this time especially given her elevated liver panel. While the patient does have gallstones noted on her evaluation she is not a candidate for immediate surgical intervention given her current liver function. As patient has been started on a clear liquid diet would continue this as tolerated. Continue to follow the patient's liver function. Should her enzymes normalize and patient or patient's guardian wish to proceed with a cholecystectomy clinic can be considered at that time possibly before discharge. However at this time I do not feel the she needs a urgent or immediate cholecystectomy and this can even be considered as an outpatient down the road pending her progression of symptomatology. I will continue to follow the patient during her hospital course. At this point I would continue current management as ordered.  Francoise Chojnowski C 07/30/2011, 3:26 PM

## 2011-07-31 LAB — HEPATIC FUNCTION PANEL
Bilirubin, Direct: 0.2 mg/dL (ref 0.0–0.3)
Indirect Bilirubin: 0.2 mg/dL — ABNORMAL LOW (ref 0.3–0.9)
Total Protein: 5.6 g/dL — ABNORMAL LOW (ref 6.0–8.3)

## 2011-07-31 LAB — BASIC METABOLIC PANEL
BUN: 7 mg/dL (ref 6–23)
Calcium: 9 mg/dL (ref 8.4–10.5)
Creatinine, Ser: 0.65 mg/dL (ref 0.50–1.10)
GFR calc Af Amer: >90 mL/min (ref >90)
GFR calc non Af Amer: >90 mL/min (ref >90)

## 2011-07-31 MED ORDER — SODIUM CHLORIDE 0.9 % IJ SOLN
INTRAMUSCULAR | Status: AC
  Filled 2011-07-31: qty 3

## 2011-07-31 NOTE — Progress Notes (Signed)
Subjective:  Temp 101.2 during the night. Ecoli in blood and urine. On Rocephin. Patient denies N/V/D and abdominal pain. Tolerating clear liquids this morning.   Objective: Vital signs in last 24 hours: Temp:  [98.5 F (36.9 C)-101.2 F (38.4 C)] 98.5 F (36.9 C) (03/07 1914) Pulse Rate:  [67-80] 67  (03/07 0632) Resp:  [18] 18  (03/07 7829) BP: (92-107)/(60-74) 92/60 mmHg (03/07 0632) SpO2:  [95 %-96 %] 96 % (03/07 5621) Last BM Date: 07/30/11 (per patient) General:   Alert,  Well-developed, well-nourished, pleasant and cooperative in NAD Head:  Normocephalic and atraumatic. Eyes:  Sclera clear, no icterus.  Chest: CTA bilaterally without rales, rhonchi, crackles.    Heart:  Regular rate and rhythm; no murmurs, clicks, rubs,  or gallops. Abdomen:  Soft, nontender and nondistended.  Normal bowel sounds, without guarding, and without rebound.   Extremities:  Without clubbing, deformity or edema. Neurologic:  Alert and  oriented x4;  grossly normal neurologically. Skin:  Intact without significant lesions or rashes. Psych:  Alert and cooperative. Normal mood and affect.  Intake/Output from previous day: 03/06 0701 - 03/07 0700 In: 1793.8 [P.O.:120; I.V.:1623.8; IV Piggyback:50] Out: 400 [Urine:400] Intake/Output this shift: Total I/O In: -  Out: 200 [Urine:200]  Lab Results: CBC  Basename 07/30/11 0529 07/29/11 1310  WBC 8.4 10.9*  HGB 12.2 13.9  HCT 36.7 42.8  MCV 91.5 92.0  PLT 241 293   BMET  Basename 07/31/11 0454 07/30/11 0529 07/29/11 1310  NA 139 142 142  K 3.7 3.4* 3.9  CL 107 104 101  CO2 26 28 31   GLUCOSE 103* 98 142*  BUN 7 12 14   CREATININE 0.65 0.76 0.84  CALCIUM 9.0 9.3 10.3   LFTs  Basename 07/30/11 0529 07/29/11 1310  BILITOT 1.3* 1.4*1.4*  BILIDIR 1.0* 0.9*  IBILI 0.3 --  ALKPHOS 122* 147*  AST 1078* 3979*  ALT 1712* 3125*  PROT 6.1 7.3  ALBUMIN 3.0* 3.7    Basename 07/29/11 1310  LIPASE 30   PT/INR  Basename 07/30/11 1235    LABPROT 14.0  INR 1.06      Imaging Studies: US Abdomen Complete  07/29/2011  *RADIOLOGY REPORT*  Clinical Data:  Elevated LFTs, nausea, vomiting.  COMPLETE ABDOMINAL ULTRASOUND  Comparison:  05/11/2009  Findings:  Gallbladder:  12 mm gallstone within the gallbladder.  Gallbladder wall slightly thickened at 3.4 mm.  Negative sonographic Murphy's. Small amount of sludge in the gallbladder.  Common bile duct:   Normal caliber, 6 mm.  Liver:  No focal lesion identified.  Within normal limits in parenchymal echogenicity.  IVC:  Appears normal.  Pancreas:  No focal abnormality seen.  Spleen:  Within normal limits in size and echotexture.  Right Kidney:   Normal in size and parenchymal echogenicity.  No evidence of mass or hydronephrosis.  Left Kidney:  Normal in size and parenchymal echogenicity.  No evidence of mass or hydronephrosis.  Abdominal aorta:  No aneurysm identified.  IMPRESSION: Gallstones and sludge within the gallbladder.  Mild gallbladder wall thickening, likely related to chronic cholecystitis.  Negative sonographic Murphy's.  Original Report Authenticated By: Cyndie Chime, M.D.  [2 weeks]   Assessment: Elevated LFTs in setting of N/V/D and urosepsis (E.Coli).  LFTs need to be rechecked today. Suspect bump in LFTs related to ischemia. ?chronic cholecystitis by u/s, current LFTS and symptoms likely unrelated.   Plan: 1. F/U LFTs. 2. Advance diet, heart healthy.   LOS: 2 days   Tana Coast  07/31/2011, 8:36 AM   Followup LFTs total bilirubin 0.4, AST 393 and ALT 1045-aminotransferases plummeting as expected with ischemic hepatitis.  Would recheck again on March 9.

## 2011-07-31 NOTE — Progress Notes (Signed)
  Subjective: Still somewhat confused, but better than yesterday.  States pain is better.  Tolerating PO.  Objective: Vital signs in last 24 hours: Temp:  [97.8 F (36.6 C)-98.5 F (36.9 C)] 97.8 F (36.6 C) (03/07 2113) Pulse Rate:  [66-70] 66  (03/07 2113) Resp:  [18-98] 98  (03/07 2113) BP: (92-112)/(60-69) 112/69 mmHg (03/07 2113) SpO2:  [95 %-97 %] 97 % (03/07 2113) Last BM Date: 07/30/11 (per patient)  Intake/Output from previous day: 03/06 0701 - 03/07 0700 In: 1793.8 [P.O.:120; I.V.:1623.8; IV Piggyback:50] Out: 400 [Urine:400] Intake/Output this shift:    General appearance: no distress GI: +BS, soft, flat, moderate RUQ tenderness.  No diffuse peritoneal signs.  Lab Results:   Basename 07/30/11 0529 07/29/11 1310  WBC 8.4 10.9*  HGB 12.2 13.9  HCT 36.7 42.8  PLT 241 293   BMET  Basename 07/31/11 0454 07/30/11 0529  NA 139 142  K 3.7 3.4*  CL 107 104  CO2 26 28  GLUCOSE 103* 98  BUN 7 12  CREATININE 0.65 0.76  CALCIUM 9.0 9.3   PT/INR  Basename 07/30/11 1235  LABPROT 14.0  INR 1.06   ABG No results found for this basename: PHART:2,PCO2:2,PO2:2,HCO3:2 in the last 72 hours  Studies/Results: No results found.  Anti-infectives: Anti-infectives     Start     Dose/Rate Route Frequency Ordered Stop   07/29/11 1800   cefTRIAXone (ROCEPHIN) 1 g in dextrose 5 % 50 mL IVPB        1 g 100 mL/hr over 30 Minutes Intravenous Every 24 hours 07/29/11 1801     07/29/11 1630   piperacillin-tazobactam (ZOSYN) IVPB 4.5 g        4.5 g 200 mL/hr over 30 Minutes Intravenous  Once 07/29/11 1628 07/29/11 1713          Assessment/Plan: s/p * No surgery found * RUQ tenderness, cholelithiasis, elevated LFTs.  Pain is better and patient is tolerating PO.  I still feel her symptoms are likely more related to a hepatic etiology versus a gallbladder source.  I will continue to follow her course.  She will likely need a cholecystectomy in the future however I do not  feel this is urgent at this time and patient will need normalization of LFTs prior to any consideration of cholecystectomy.  LOS: 2 days    Phillipe Clemon C 07/31/2011

## 2011-07-31 NOTE — Progress Notes (Signed)
NAMEMELINA, [name]                ACCOUNT NO.:  [id number]  MEDICAL RECORD NO.:  [id number]  LOCATION:  A305                          FACILITY:  [facility]  PHYSICIAN:  Adda Stokes D. Felecia Shelling, MD   DATE OF BIRTH:  October 27, 1950  DATE OF PROCEDURE:  07/31/2011 DATE OF DISCHARGE:                                PROGRESS NOTE   SUBJECTIVE:  The patient claims she does not feel well.  She has no fever, chills, nausea or vomiting.  OBJECTIVE:  GENERAL:  The patient is awake and alert, but sick looking. VITAL SIGNS:  Blood pressure 107/74, pulse 80, respiratory rate 18, temperature 101.2 degrees Fahrenheit. CHEST:  Decreased air entry, few rhonchi. CARDIOVASCULAR SYSTEM:  First and second heart sound heard.  No murmur. No gallop. ABDOMEN:  Soft and lax.  Bowel sound is positive.  No mass or organomegaly.  There is no significant tenderness. EXTREMITIES:  No leg edema.  ASSESSMENT: 1. Acute cholecystitis. 2. Cholelithiasis. 3. Sepsis. 4. Dehydration. 5. Hypothyroidism.  PLAN:  Continue the patient on IV antibiotics.  Continue IV fluids. Surgical and GI consult appreciated.     Shawntel Farnworth D. Felecia Shelling, MD     TDF/MEDQ  D:  07/31/2011  T:  07/31/2011  Job:  [id number]

## 2011-08-01 DIAGNOSIS — R197 Diarrhea, unspecified: Secondary | ICD-10-CM

## 2011-08-01 DIAGNOSIS — R112 Nausea with vomiting, unspecified: Secondary | ICD-10-CM

## 2011-08-01 DIAGNOSIS — R7989 Other specified abnormal findings of blood chemistry: Secondary | ICD-10-CM

## 2011-08-01 LAB — CULTURE, BLOOD (ROUTINE X 2): Culture  Setup Time: 201303061545

## 2011-08-01 LAB — URINE CULTURE

## 2011-08-01 MED ORDER — PANTOPRAZOLE SODIUM 40 MG PO TBEC
40.0000 mg | DELAYED_RELEASE_TABLET | Freq: Every day | ORAL | Status: DC
  Administered 2011-08-01 – 2011-08-05 (×5): 40 mg via ORAL
  Filled 2011-08-01 (×5): qty 1

## 2011-08-01 NOTE — Progress Notes (Signed)
REVIEWED.  ADD PPI FOR GI PROPHYLAXIS.

## 2011-08-01 NOTE — Progress Notes (Signed)
Subjective:  Patient without complaints. Denies abdominal pain. Eating breakfast. Wants more coffee.   Objective: Vital signs in last 24 hours: Temp:  [97.8 F (36.6 C)-98.2 F (36.8 C)] 97.9 F (36.6 C) (03/08 0653) Pulse Rate:  [66-70] 67  (03/08 0653) Resp:  [18-98] 96  (03/08 0653) BP: (95-112)/(63-71) 101/71 mmHg (03/08 0653) SpO2:  [95 %-97 %] 96 % (03/08 0653) Last BM Date: 07/30/11 General:   Alert,  Well-developed, well-nourished, pleasant and cooperative in NAD Head:  Normocephalic and atraumatic. Eyes:  Sclera clear, no icterus.  Abdomen:  Soft, nontender and nondistended.     Extremities:  Without clubbing, deformity or edema. Neurologic:  Alert and  oriented x4;  grossly normal neurologically. Skin:  Intact without significant lesions or rashes. Psych:  Alert and cooperative. Normal mood and affect.  Intake/Output from previous day: 03/07 0701 - 03/08 0700 In: 2528.8 [P.O.:720; I.V.:1758.8; IV Piggyback:50] Out: 2053 [Urine:2050; Stool:3] Intake/Output this shift: Total I/O In: 240 [P.O.:240] Out: 200 [Urine:200]  Lab Results: None for today      Assessment: Elevated LFTs in setting of N/V/D and E.coli urosepsis. Suspect bump in LFTs related to ischemia. Have been improving. Due for recheck tomorrow. Clinically patient improved from standpoint of N/V/D.  Plan: 1. Recommend rechecking LFTs tomorrow.     LOS: 3 days   Tana Coast  08/01/2011, 9:04 AM

## 2011-08-01 NOTE — Progress Notes (Signed)
Nutrition Follow-up  Diet Order:  Heart Healthy  Meds: Scheduled Meds:   . antiseptic oral rinse  15 mL Mouth Rinse BID  . cefTRIAXone (ROCEPHIN)  IV  1 g Intravenous Q24H  . cholecalciferol  800 Units Oral Daily  . enoxaparin (LOVENOX) injection  40 mg Subcutaneous Q24H  . feeding supplement  1 Container Oral TID WC  . ketorolac  1 drop Left Eye BID  . levothyroxine  25 mcg Oral Daily  . LORazepam  0.5 mg Oral BID  . mulitivitamin with minerals  1 tablet Oral Daily  . polyethylene glycol  17 g Oral Daily  . sodium chloride      . sodium chloride      . sodium chloride       Continuous Infusions:   . dextrose 5 % and 0.45 % NaCl with KCl 20 mEq/L 75 mL/hr at 08/01/11 1214   PRN Meds:.ondansetron  Labs:  CMP     Component Value Date/Time   NA 139 07/31/2011 0454   K 3.7 07/31/2011 0454   CL 107 07/31/2011 0454   CO2 26 07/31/2011 0454   GLUCOSE 103* 07/31/2011 0454   BUN 7 07/31/2011 0454   CREATININE 0.65 07/31/2011 0454   CALCIUM 9.0 07/31/2011 0454   PROT 5.6* 07/31/2011 0500   ALBUMIN 2.6* 07/31/2011 0500   AST 393* 07/31/2011 0500   ALT 1045* 07/31/2011 0500   ALKPHOS 115 07/31/2011 0500   BILITOT 0.4 07/31/2011 0500   GFRNONAA >90 07/31/2011 0454   GFRAA >90 07/31/2011 0454     Intake/Output Summary (Last 24 hours) at 08/01/11 1223 Last data filed at 08/01/11 1011  Gross per 24 hour  Intake 2528.75 ml  Output   1901 ml  Net 627.75 ml   Assessment: Patient with documented PO intake 25-75% at meals. (Not meeting PO intake goal.). Patient PO intake 65% at breakfast meal. Patient stated she likes [employer] supplement. Patient dislikes Ensure supplement. Discussed patient in progression rounds. Patient refusing lunch at care facility. Recommend nutrition supplementation Resource Breeze at care facility.   Weight Status:  101 lb. (stable) -  Underweight.   Estimated Nutritional Needs:  Kcal: 1150-1380  Protein: 55-69 grams  Fluid: 1 ml per kcal    Nutrition Dx: Inadequate Oral  Intake, Ongoing.   Monitor/ Goal: PO intake, diet advancement, I/O's, weight trends  1. Meet > 90% of estimated energy needs  2. PO intake greater than 75% at meals and supplements - Not meeting.   3.Promote weight gain/ Prevent weight loss   Intervention:  1. Will continue to send Resource Breeze TID.   2. Recommend patient continue Resource Breeze BID supplement after discharge.     Iven Finn [facility] Pager #:  [phone]

## 2011-08-01 NOTE — Progress Notes (Signed)
Kimberly [name], Kimberly [name]                ACCOUNT NO.:  [id number]  MEDICAL RECORD NO.:  [id number]  LOCATION:  A305                          FACILITY:  [facility]  PHYSICIAN:  Chirstopher Iovino D. Felecia Shelling, MD   DATE OF BIRTH:  08-12-1950  DATE OF PROCEDURE:  08/01/2011 DATE OF DISCHARGE:                                PROGRESS NOTE   SUBJECTIVE:  The patient is not communicating much.  She is sleepy and no new complaints.  OBJECTIVE:  GENERAL:  The patient is arousable but chronically sick looking. VITAL SIGNS:  Blood pressure 112/69, pulse 70, respiratory rate 18, temperature 98.2 degrees Fahrenheit. CHEST:  Decreased air entry, few rhonchi. CARDIOVASCULAR SYSTEM:  First and second heart sounds heard.  No murmur. No gallop. ABDOMEN:  Soft and lax.  Bowel sound is positive.  No mass or organomegaly.  EXTREMITIES:  No leg edema.  ASSESSMENT: 1. Acute cholecystitis with cholelithiasis. 2. Elevated liver function probably related with above. 3. Hypothyroidism. 4. Protein-calorie malnutrition.  PLAN:  We will continue the patient on IV antibiotics.  Continue IV fluids.  We will continue to monitor her BMP and LFT.     Jasmyne Lodato D. Felecia Shelling, MD     TDF/MEDQ  D:  08/01/2011  T:  08/01/2011  Job:  [id number]

## 2011-08-01 NOTE — Progress Notes (Signed)
  Subjective: Tolerating regular diet. Abdominal pain improved. No fevers or chills  Objective: Vital signs in last 24 hours: Temp:  [97.8 F (36.6 [name])-98.2 F (36.8 [name])] 97.9 F (36.6 [name]) (03/08 0653) Pulse Rate:  [66-70] 67  (03/08 0653) Resp:  [18-98] 96  (03/08 0653) BP: (95-112)/(63-71) 101/71 mmHg (03/08 0653) SpO2:  [95 %-97 %] 96 % (03/08 0653) Last BM Date: 07/30/11  Intake/Output from previous day: 03/07 0701 - 03/08 0700 In: 2528.8 [P.O.:720; I.V.:1758.8; IV Piggyback:50] Out: 2053 [Urine:2050; Stool:3] Intake/Output this shift: Total I/O In: 240 [P.O.:240] Out: 500 [Urine:500]  General appearance: no distress and Awake mild confusion GI: Soft, mild to moderate right upper quadrant abdominal tenderness. No diffuse peritoneal signs. No hernias or masses.  Lab Results:   Basename 07/30/11 0529 07/29/11 1310  WBC 8.4 10.9*  HGB 12.2 13.9  HCT 36.7 42.8  PLT 241 293   BMET  Basename 07/31/11 0454 07/30/11 0529  NA 139 142  K 3.7 3.4*  CL 107 104  CO2 26 28  GLUCOSE 103* 98  BUN 7 12  CREATININE 0.65 0.76  CALCIUM 9.0 9.3   PT/INR  Basename 07/30/11 1235  LABPROT 14.0  INR 1.06   ABG No results found for this basename: PHART:2,PCO2:2,PO2:2,HCO3:2 in the last 72 hours  Studies/Results: No results found.  Anti-infectives: Anti-infectives     Start     Dose/Rate Route Frequency Ordered Stop   07/29/11 1800   cefTRIAXone (ROCEPHIN) 1 g in dextrose 5 % 50 mL IVPB        1 g 100 mL/hr over 30 Minutes Intravenous Every 24 hours 07/29/11 1801     07/29/11 1630   piperacillin-tazobactam (ZOSYN) IVPB 4.5 g        4.5 g 200 mL/hr over 30 Minutes Intravenous  Once 07/29/11 1628 07/29/11 1713          Assessment/Plan: s/p * No surgery found * Cholelithiasis, elevated LFTs, abdominal tenderness. At this point patient has been advanced to a diet. Clinically I do not feel the patient requires urgent or immediate cholecystectomy and actually recommend  continued interval. To allow the hepatic inflammation to improve. She is tolerating a regular diet I do feel that discharge back to the extended care facility is appropriate once determined by her primary physician. At this point I will be available as needed and we'll follow her peripherally.  LOS: 3 days    Kimberly [name] 08/01/2011

## 2011-08-02 LAB — CBC
MCV: 92.6 fL (ref 78.0–100.0)
Platelets: 231 10*3/uL (ref 150–400)
RDW: 14.1 % (ref 11.5–15.5)
WBC: 5.6 10*3/uL (ref 4.0–10.5)

## 2011-08-02 LAB — BASIC METABOLIC PANEL
Chloride: 106 mEq/L (ref 96–112)
Creatinine, Ser: 0.64 mg/dL (ref 0.50–1.10)
GFR calc Af Amer: >90 mL/min (ref >90)
GFR calc non Af Amer: >90 mL/min (ref >90)

## 2011-08-02 MED ORDER — SODIUM CHLORIDE 0.9 % IJ SOLN
INTRAMUSCULAR | Status: AC
  Filled 2011-08-02: qty 6

## 2011-08-02 NOTE — Progress Notes (Signed)
Kimberly [name], Kimberly [name]                ACCOUNT NO.:  [id number]  MEDICAL RECORD NO.:  [id number]  LOCATION:  A305                          FACILITY:  [facility]  PHYSICIAN:  Almon Whitford D. Felecia Shelling, MD   DATE OF BIRTH:  1950/07/29  DATE OF PROCEDURE:  08/02/2011 DATE OF DISCHARGE:                                PROGRESS NOTE   SUBJECTIVE:  The patient is feeling better.  She is tolerating her diet. No fever or chills.  OBJECTIVE:  GENERAL:  The patient is more alert, awake, and not in any form of distress. VITAL SIGNS:  Blood pressure 97/66, pulse 74, respiratory rate 18, temperature 98.5 degrees Fahrenheit. CHEST:  Decreased air entry, few rhonchi. CARDIOVASCULAR SYSTEM:  First and second heart sounds heard.  No murmur. No gallop. ABDOMEN:  Soft, and lax.  Bowel sound is positive.  No mass or organomegaly. EXTREMITIES:  No leg edema.  LABS:  WBC 5.6, hemoglobin 12.3, hematocrit 37.4, and platelets 231. BMP: Sodium 140, potassium 4.5, chloride 106, carbon dioxide 28, glucose 91, BUN 5, creatinine 0.6, calcium 9.1.  LFT result pending.  ASSESSMENT: 1. Acute cholecystitis, clinically improving. 2. Elevated liver function, now could be secondary to the above. 3. Urinary tract infection.  PLAN:  We will continue the patient on IV antibiotics.  Continue current medications and supportive care.     Rogue Pautler D. Felecia Shelling, MD     TDF/MEDQ  D:  08/02/2011  T:  08/02/2011  Job:  [id number]

## 2011-08-03 LAB — HEPATIC FUNCTION PANEL
ALT: 418 U/L — ABNORMAL HIGH (ref 0–35)
Indirect Bilirubin: 0.1 mg/dL — ABNORMAL LOW (ref 0.3–0.9)
Total Protein: 5.7 g/dL — ABNORMAL LOW (ref 6.0–8.3)

## 2011-08-03 LAB — CULTURE, BLOOD (ROUTINE X 2)

## 2011-08-03 NOTE — Progress Notes (Signed)
Kimberly [name], Kimberly [name]                ACCOUNT NO.:  [id number]  MEDICAL RECORD NO.:  [id number]  LOCATION:  A305                          FACILITY:  [facility]  PHYSICIAN:  Ziyah Cordoba D. Felecia Shelling, MD   DATE OF BIRTH:  06/13/1950  DATE OF PROCEDURE:  08/03/2011 DATE OF DISCHARGE:                                PROGRESS NOTE   SUBJECTIVE:  The patient feels better.  Her abdominal pain has subsided. Her p.o. intake also has improved.  She has no new complaints.  OBJECTIVE:  GENERAL/VITAL SIGNS:  Patient is alert, awake, sick-looking with vitals blood pressure 113/79, pulse 72, respiratory rate 18, temperature 98 degrees Fahrenheit. CHEST:  Decreased air entry, few rhonchi. CARDIOVASCULAR SYSTEM:  First and second heart sounds heard.  No murmur, no gallop. ABDOMEN:  Soft and lax.  Bowel sound is positive.  No mass or organomegaly.  LABORATORY DATA:  Total protein 5.7, albumin 2.5, AST 62, ALT 488, alkaline phosphatase 125.  ASSESSMENT: 1. Acute cholecystitis, clinically improving. 2. Elevated liver enzymes. 3. Urinary tract infection.  PLAN:  We will continue the patient on IV antibiotics.  Continue current treatment.  We will plan to discharge the patient tomorrow.     Fahad Cisse D. Felecia Shelling, MD     TDF/MEDQ  D:  08/03/2011  T:  08/03/2011  Job:  [id number]

## 2011-08-04 MED ORDER — CEFUROXIME AXETIL 500 MG PO TABS: 500.0000 mg | ORAL_TABLET | Freq: Two times a day (BID) | ORAL | Status: AC

## 2011-08-04 MED ORDER — CIPROFLOXACIN HCL 500 MG PO TABS: 500.0000 mg | ORAL_TABLET | Freq: Two times a day (BID) | ORAL | Status: DC

## 2011-08-04 NOTE — Progress Notes (Signed)
Clinical Social Work   Following for discharge planning and planned return to facility this afternoon.  Patient is from [facility] and can return at dc.  Awaiting dc summary which was dictated in E-chart and not available at this time.  FL2 needs to be signed as well and faxed to MD office for signature.  Spoke with assisted Living who has a cut off time before 5:00pm to send the patient due to Pharmacy closing.  Patient can transport by Zenaida Niece with [facility] in which CSW will arrange.  Awaiting dc documentation and hopeful for dc this evening.  Ashley Jacobs, MSW LCSW [phone]

## 2011-08-04 NOTE — Discharge Summary (Signed)
Kimberly [name], Kimberly [name]                ACCOUNT NO.:  [id number]  MEDICAL RECORD NO.:  [id number]  LOCATION:  A305                          FACILITY:  [facility]  PHYSICIAN:  Casilda Pickerill D. Felecia Shelling, MD   DATE OF BIRTH:  Jul 11, 1950  DATE OF ADMISSION:  07/29/2011 DATE OF DISCHARGE:  03/11/2013LH                              DISCHARGE SUMMARY   DISCHARGE DIAGNOSES: 1. Acute cholecystitis with cholelithiasis. 2. Urinary tract infection. 3. Dehydration. 4. Hypothyroidism. 5. Hyperlipidemia. 6. Elevated liver enzymes.  DISCHARGE MEDICATIONS: 1. Ceftin 500 mg p.o. b.i.d. 2. Acetaminophen 325 mg 3 tablets q.8 h. p.r.n. 3. Vitamin D 4000 units daily. 4. Lasix 20 mg daily. 5. Levothyroxine 25 mcg daily. 6. Ativan 0.5 mg daily. 7. Multivitamin 1 tab daily. 8. MiraLax 17 g daily. 9. Simvastatin 20 mg daily.  DISPOSITION:  The patient will be discharged to assisted living in.  DISCHARGE INSTRUCTIONS:  The patient will continue her current medications.  She will be followed in the office in 1 week duration.  LABORATORY DATA ON DISCHARGE:  Total protein 5.7, albumin 2.5, AST 62, ALT 418, alkaline phosphatase 125.  HOSPITAL COURSE:  This is a 61 year old female patient with history of multiple medical illnesses, was admitted by Dr. Fransico Him due to nausea, vomiting, and abdominal pain.  Her ultrasound showed the sign of chololithiasis with cholecystitis.  Her urine also grew E. Coli, which is resistant to ciprofloxacin and sulfa.  The patient was treated with IV antibiotics, and she was evaluated by GI and Surgery.  Her liver function which was previously elevated on admission gradually improved.  The patient is able to tolerate her p.o. diet.  She is back to her baseline.  The patient will be discharged on oral medications.  Since her E. coli is resistant to on ciprofloxacin, her discharge antibiotics is changed to Ceftin 500 mg b.i.d. for 5 days.     Ita Fritzsche D. Felecia Shelling, MD     TDF/MEDQ  D:   08/04/2011  T:  08/04/2011  Job:  [id number]

## 2011-08-05 NOTE — Progress Notes (Signed)
Kimberly [name], Kimberly [name]                ACCOUNT NO.:  [id number]  MEDICAL RECORD NO.:  [id number]  LOCATION:  A305                          FACILITY:  [facility]  PHYSICIAN:  Kaikoa Magro D. Felecia Shelling, MD   DATE OF BIRTH:  07/27/1950  DATE OF PROCEDURE:  08/05/2011 DATE OF DISCHARGE:  08/05/2011                                PROGRESS NOTE   SUBJECTIVE:  The patient is alert, awake, and resting.  She was supposed to be discharged yesterday.  Still waiting for transfer to assisted living.  The patient has no new complaints.  PHYSICAL EXAMINATION:  VITAL SIGNS:  Blood pressure 93/61, pulse 80, respiratory rate 20, temperature 98.4 degrees Fahrenheit. CHEST:  Clear lung fields, good air entry. CARDIOVASCULAR SYSTEM:  First and second heart sounds heard.  No murmur, no gallop. ABDOMEN:  Soft and lax.  Bowel sound is positive.  No mass or organomegaly. EXTREMITIES:  No leg edema.  ASSESSMENT: 1. Acute cholecystitis with cholelithiasis, clinically improving. 2. Urinary tract infection. 3. Dehydration, resolved. 4. Hypothyroidism. 5. Hyperlipidemia. 6. Elevated liver enzymes, improved.  PLAN:  The patient will be discharged to an assisted living as soon as arrangement is made.  She will continue current medications and followup in the office.     Cherril Hett D. Felecia Shelling, MD     TDF/MEDQ  D:  08/05/2011  T:  08/05/2011  Job:  [id number]

## 2011-08-05 NOTE — Progress Notes (Signed)
Clinical Social Work  Patient is medically is stable for discharge and return to ALF:  [facility].  FL2 was updated and reconciled with medications and facility agreeable for dc today.  [facility] will pick patient up around lunch time. Patient made aware and agreeable for discharge.  No other needs and CSW signed off.  Ashley Jacobs, MSW LCSW [phone]

## 2011-11-03 ENCOUNTER — Encounter (HOSPITAL_COMMUNITY): Payer: Self-pay | Admitting: Emergency Medicine

## 2011-11-03 ENCOUNTER — Emergency Department (HOSPITAL_COMMUNITY): Payer: Medicaid Other

## 2011-11-03 ENCOUNTER — Emergency Department (HOSPITAL_COMMUNITY)
Admission: EM | Admit: 2011-11-03 | Discharge: 2011-11-03 | Disposition: A | Discharge status: Home / Self Care | Payer: Medicaid Other | Attending: Emergency Medicine | Admitting: Emergency Medicine

## 2011-11-03 DIAGNOSIS — F17200 Nicotine dependence, unspecified, uncomplicated: Secondary | ICD-10-CM | POA: Insufficient documentation

## 2011-11-03 DIAGNOSIS — K859 Acute pancreatitis without necrosis or infection, unspecified: Secondary | ICD-10-CM | POA: Insufficient documentation

## 2011-11-03 DIAGNOSIS — E039 Hypothyroidism, unspecified: Secondary | ICD-10-CM | POA: Insufficient documentation

## 2011-11-03 DIAGNOSIS — J449 Chronic obstructive pulmonary disease, unspecified: Secondary | ICD-10-CM | POA: Insufficient documentation

## 2011-11-03 DIAGNOSIS — R079 Chest pain, unspecified: Secondary | ICD-10-CM | POA: Insufficient documentation

## 2011-11-03 DIAGNOSIS — J4489 Other specified chronic obstructive pulmonary disease: Secondary | ICD-10-CM | POA: Insufficient documentation

## 2011-11-03 LAB — COMPREHENSIVE METABOLIC PANEL
Alkaline Phosphatase: 65 U/L (ref 39–117)
BUN: 12 mg/dL (ref 6–23)
CO2: 24 mEq/L (ref 19–32)
Chloride: 102 mEq/L (ref 96–112)
GFR calc Af Amer: 78 mL/min — ABNORMAL LOW (ref >90)
GFR calc non Af Amer: 67 mL/min — ABNORMAL LOW (ref >90)
Glucose, Bld: 85 mg/dL (ref 70–99)
Potassium: 3.8 mEq/L (ref 3.5–5.1)
Total Bilirubin: 0.1 mg/dL — ABNORMAL LOW (ref 0.3–1.2)

## 2011-11-03 LAB — CBC
HCT: 42.2 % (ref 36.0–46.0)
Hemoglobin: 14.1 g/dL (ref 12.0–15.0)
MCHC: 33.4 g/dL (ref 30.0–36.0)
RBC: 4.65 MIL/uL (ref 3.87–5.11)
WBC: 6.7 10*3/uL (ref 4.0–10.5)

## 2011-11-03 LAB — LIPASE, BLOOD: Lipase: 61 U/L — ABNORMAL HIGH (ref 11–59)

## 2011-11-03 MED ORDER — ONDANSETRON HCL 4 MG PO TABS: 4.0000 mg | ORAL_TABLET | Freq: Four times a day (QID) | ORAL | Status: AC | PRN

## 2011-11-03 MED ORDER — HYDROCODONE-ACETAMINOPHEN 5-325 MG PO TABS
2.0000 | ORAL_TABLET | Freq: Four times a day (QID) | ORAL | Status: AC | PRN
Start: 2011-11-03 — End: 2011-11-13

## 2011-11-03 NOTE — ED Provider Notes (Cosign Needed)
History     CSN: [id number]  Arrival date & time 11/03/11  1652   First MD Initiated Contact with Patient 11/03/11 1708      Chief Complaint  Patient presents with  . Chest Pain    (Consider location/radiation/quality/duration/timing/severity/associated sxs/prior treatment) HPI  Patient relates she ate lunch about 11:30 and had pork chops. She relates about an hour later she started having pain in the center of her chest that she describes as being achy. She denies radiation to her abdomen, shoulders, arms, or neck. She denies nausea, vomiting, shortness of breath, or diaphoresis. She initially, she never had it before then she told me sometimes when she eats pork chop she gets pain. Looking at the patient's records from the assisted living she has a history of acute/chronic cholecystitis. Patient relates her pain is now gone. She states it was resolved when EMS arrived. She denies getting any medications from EMS.  PCP Dr. Fransico Him  Past Medical History  Diagnosis Date  . COPD (chronic obstructive pulmonary disease)   . CHF (congestive heart failure)   . Anemia   . Arthritis     osteoarthritis  . Hypothyroidism   . HOH (hard of hearing)   . Pneumonia   . Shortness of breath   . Depression   . DDD (degenerative disc disease)     Past Surgical History  Procedure Date  . Cesarean section   . Arm debridement     as child  . Strabismus surgery     age 65  . Cataract extraction w/phaco 04/28/2011    Procedure: CATARACT EXTRACTION PHACO AND INTRAOCULAR LENS PLACEMENT (IOC);  Surgeon: Susa Simmonds;  Location: [facility];  Service: Ophthalmology;  Laterality: Left;  CDE: 5.37  . 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 Colon cancer      neg hx?     History  Substance Use Topics  . Smoking status: Current Everyday Smoker -- 0.5 packs/day     Types: Cigarettes  . Smokeless tobacco: Never Used  . Alcohol Use: No  lives in assisted living  OB History    Grav Para Term Preterm Abortions TAB SAB Ect Mult Living   2 2 2       2       Review of Systems  All other systems reviewed and are negative.    Allergies  Review of patient's allergies indicates no known allergies.  Home Medications   Current Outpatient Rx  Name Route Sig Dispense Refill  . ACETAMINOPHEN 325 MG PO TABS Oral Take 650 mg by mouth 3 (three) times daily.      . CHOLECALCIFEROL 400 UNITS PO TABS Oral Take 800 Units by mouth daily.      . FUROSEMIDE 20 MG PO TABS Oral Take 20 mg by mouth daily.      Marland Kitchen LEVOTHYROXINE SODIUM 25 MCG PO TABS Oral Take 25 mcg by mouth daily.      Marland Kitchen LORAZEPAM 0.5 MG PO TABS Oral Take 0.5 mg by mouth 2 (two) times daily.      Carma Leaven M PLUS PO TABS Oral Take 1 tablet by mouth daily.      Marland Kitchen SIMVASTATIN 20 MG PO TABS Oral Take 20 mg by mouth every evening.    Marland Kitchen POLYETHYLENE GLYCOL 3350 PO PACK Oral Take 17 g by mouth daily.  constipation       BP 117/65  Pulse 70  Temp(Src) 97.9 F (36.6 C) (Oral)  Resp 18  Ht 5\' 1"  (1.549 m)  Wt 100 lb (45.36 kg)  BMI 18.89 kg/m2  SpO2 99%  Vital signs normal    Physical Exam  Vitals reviewed. Constitutional: She is oriented to person, place, and time. She appears well-developed and well-nourished.  Non-toxic appearance. She does not appear ill. No distress.  HENT:  Head: Normocephalic and atraumatic.  Right Ear: External ear normal.  Left Ear: External ear normal.  Nose: Nose normal. No mucosal edema or rhinorrhea.  Mouth/Throat: Oropharynx is clear and moist and mucous membranes are normal. No dental abscesses or uvula swelling.  Eyes: Conjunctivae and EOM are normal. Pupils are equal, round, and reactive to light.       Is are slightly disconjugate, patient states she cannot see well out of one eye indicating her left  Neck: Normal range of motion and full passive range of  motion without pain. Neck supple.  Cardiovascular: Normal rate, regular rhythm and normal heart sounds.  Exam reveals no gallop and no friction rub.   No murmur heard. Pulmonary/Chest: Effort normal and breath sounds normal. No respiratory distress. She has no wheezes. She has no rhonchi. She has no rales. She exhibits no tenderness and no crepitus.  Abdominal: Soft. Normal appearance and bowel sounds are normal. She exhibits no distension. There is no tenderness. There is no rebound and no guarding.  Musculoskeletal: Normal range of motion. She exhibits no edema and no tenderness.       Moves all extremities well.   Neurological: She is alert and oriented to person, place, and time. She has normal strength. No cranial nerve deficit.       Patient seems to be a little mentally slow  Skin: Skin is warm, dry and intact. No rash noted. No erythema. No pallor.  Psychiatric: She has a normal mood and affect. Her speech is normal and behavior is normal. Her mood appears not anxious.    ED Course  Procedures (including critical care time)  Recheck 19:15 states she feels fine, sleeping, will check another troponin and if negative she can be discharged, she may have mild pancreatitis, but currently she is denying any abdominal pain and is nontender to palpation.   Patient has papers from her nursing home stating she has a history of gallstones and cholecystitis. It is not clear to me why she's not had surgery. Patient remains pain-free in the ER it was felt she could go home. She is very minor elevation of her lipase that could be managed on a clear liquid diet.  Results for orders placed during the hospital encounter of 11/03/11  CBC      Component Value Range   WBC 6.7  4.0 - 10.5 (K/uL)   RBC 4.65  3.87 - 5.11 (MIL/uL)   Hemoglobin 14.1  12.0 - 15.0 (g/dL)   HCT 16.1  09.6 - 04.5 (%)   MCV 90.8  78.0 - 100.0 (fL)   MCH 30.3  26.0 - 34.0 (pg)   MCHC 33.4  30.0 - 36.0 (g/dL)   RDW 40.9  81.1 -  91.4 (%)   Platelets    150 - 400 (K/uL)   Value: PLATELET CLUMPS NOTED ON SMEAR, COUNT APPEARS ADEQUATE  COMPREHENSIVE METABOLIC PANEL      Component Value Range   Sodium 139  135 - 145 (mEq/L)   Potassium 3.8  3.5 -  5.1 (mEq/L)   Chloride 102  96 - 112 (mEq/L)   CO2 24  19 - 32 (mEq/L)   Glucose, Bld 85  70 - 99 (mg/dL)   BUN 12  6 - 23 (mg/dL)   Creatinine, Ser 1.19  0.50 - 1.10 (mg/dL)   Calcium 14.7  8.4 - 10.5 (mg/dL)   Total Protein 7.0  6.0 - 8.3 (g/dL)   Albumin 3.7  3.5 - 5.2 (g/dL)   AST 20  0 - 37 (U/L)   ALT 16  0 - 35 (U/L)   Alkaline Phosphatase 65  39 - 117 (U/L)   Total Bilirubin 0.1 (*) 0.3 - 1.2 (mg/dL)   GFR calc non Af Amer 67 (*) >90 (mL/min)   GFR calc Af Amer 78 (*) >90 (mL/min)  LIPASE, BLOOD      Component Value Range   Lipase 61 (*) 11 - 59 (U/L)  TROPONIN I      Component Value Range   Troponin I <0.30  <0.30 (ng/mL)  TROPONIN I      Component Value Range   Troponin I <0.30  <0.30 (ng/mL)     Laboratory interpretation all normal except minor elevation of lipase in setting of known gallstones     Date: 11/03/2011  Rate: 69  Rhythm: normal sinus rhythm  QRS Axis: normal  Intervals: normal  ST/T Wave abnormalities: normal  Conduction Disutrbances:none  Narrative Interpretation:   Old EKG Reviewed: changes noted from 07/29/2011 HR was 105    1. Chest pain   2. Pancreatitis     New Prescriptions   HYDROCODONE-ACETAMINOPHEN (NORCO) 5-325 MG PER TABLET    Take 2 tablets by mouth every 6 (six) hours as needed for pain.   ONDANSETRON (ZOFRAN) 4 MG TABLET    Take 1 tablet (4 mg total) by mouth every 6 (six) hours as needed for nausea.     Plan discharge  Devoria Albe, MD, FACEP   MDM          Ward Givens, MD 11/03/11 2045

## 2011-11-03 NOTE — ED Notes (Signed)
Per patient chest pain is now gone. EDP aware.

## 2011-11-03 NOTE — Discharge Instructions (Signed)
Drink plenty of fluids (clear liquids) the next 12-24 hours then start the BRAT diet. . Use the zofran for nausea or vomiting. You have a minor elevation of your lipase which means you have a mild inflammation of your pancreas. Avoid fried, spicey or greasy foods. Recheck if you get worse.

## 2011-11-03 NOTE — ED Notes (Signed)
Pts discharge instructions given to caregiver.

## 2011-11-03 NOTE — ED Notes (Signed)
[facility] notified of patient needing transportation back to facility.

## 2011-11-03 NOTE — ED Notes (Signed)
[facility] called and return transport requested.

## 2011-11-03 NOTE — ED Notes (Signed)
x1 unsuccessful IV attempt made by Tonita Phoenix RN. Rana Snare RN to room to attempt IV.

## 2011-11-03 NOTE — ED Notes (Signed)
Patient brought in via EMS from [facility]. Alert and oriented. Patient c/o non-radiating midsternal chest pain that started 20 minutes prior to arriving to ED. Patient denies any shortness of breath, dizziness, nausea, or vomiting. No acute distress noted. Per EMS normal sinus rhythm.

## 2011-12-03 ENCOUNTER — Ambulatory Visit: Payer: Medicaid Other | Admitting: Cardiology

## 2011-12-16 ENCOUNTER — Encounter: Payer: Self-pay | Admitting: Cardiology

## 2011-12-19 ENCOUNTER — Ambulatory Visit (INDEPENDENT_AMBULATORY_CARE_PROVIDER_SITE_OTHER): Payer: Medicaid Other | Admitting: Cardiology

## 2011-12-19 ENCOUNTER — Encounter: Payer: Self-pay | Admitting: Cardiology

## 2011-12-19 ENCOUNTER — Ambulatory Visit (HOSPITAL_COMMUNITY)
Admission: RE | Admit: 2011-12-19 | Discharge: 2011-12-19 | Disposition: A | Discharge status: Home / Self Care | Payer: Medicaid Other | Source: Ambulatory Visit | Attending: Cardiology | Admitting: Cardiology

## 2011-12-19 VITALS — BP 103/71 | HR 75 | Ht 61.0 in | Wt 94.0 lb

## 2011-12-19 DIAGNOSIS — I509 Heart failure, unspecified: Secondary | ICD-10-CM | POA: Insufficient documentation

## 2011-12-19 DIAGNOSIS — I5032 Chronic diastolic (congestive) heart failure: Secondary | ICD-10-CM | POA: Insufficient documentation

## 2011-12-19 DIAGNOSIS — F17200 Nicotine dependence, unspecified, uncomplicated: Secondary | ICD-10-CM | POA: Insufficient documentation

## 2011-12-19 NOTE — Assessment & Plan Note (Signed)
She is currently stable on low-dose Lasix. I've made no change in medical therapy. We'll obtain a 2-D echocardiogram to further delineate. I'll see her back when necessary.

## 2011-12-19 NOTE — Progress Notes (Signed)
HPI [name] is a 61 year old white female, resident of [facility], who is referred by Dr. Lynelle Doctor for evaluation of chest pain and a history of unspecified congestive heart failure. She was evaluated in the emergency room on June 10. Troponins were negative. Chest x-ray showed normal size heart with COPD. She's been a heavy smoker. EKG showed sinus rhythm with no acute changes.  She denies any chest pain, shortness of breath, orthopnea, or edema. There is no previous cardiac history.  She is on low-dose Lasix.  Past Medical History  Diagnosis Date  . COPD (chronic obstructive pulmonary disease)   . CHF (congestive heart failure)   . Anemia   . Arthritis     osteoarthritis  . Hypothyroidism   . HOH (hard of hearing)   . Pneumonia   . Shortness of breath   . Depression   . DDD (degenerative disc disease)     Current Outpatient Prescriptions  Medication Sig Dispense Refill  . acetaminophen (TYLENOL) 325 MG tablet Take 650 mg by mouth 3 (three) times daily.        . cholecalciferol (VITAMIN D) 400 UNITS TABS Take 800 Units by mouth daily.        . furosemide (LASIX) 20 MG tablet Take 20 mg by mouth daily.        Marland Kitchen HYDROcodone-acetaminophen (NORCO/VICODIN) 5-325 MG per tablet Take 2 tablets by mouth every 6 (six) hours as needed.       Marland Kitchen levothyroxine (SYNTHROID, LEVOTHROID) 25 MCG tablet Take 25 mcg by mouth daily.        Marland Kitchen LORazepam (ATIVAN) 0.5 MG tablet Take 0.5 mg by mouth 2 (two) times daily.        . Multiple Vitamins-Minerals (MULTIVITAMINS THER. W/MINERALS) TABS Take 1 tablet by mouth daily.        . ondansetron (ZOFRAN) 4 MG tablet Take 4 mg by mouth every 6 (six) hours as needed.       . polyethylene glycol (MIRALAX / GLYCOLAX) packet Take 17 g by mouth daily. constipation       . simvastatin (ZOCOR) 20 MG tablet Take 20 mg by mouth every evening.        No Known Allergies  Family History  Problem Relation Age of Onset  . Colon cancer      neg hx?      History   Social History  . Marital Status: Divorced    Spouse Name: N/A    Number of Children: N/A  . Years of Education: N/A   Occupational History  . Not on file.   Social History Main Topics  . Smoking status: Current Everyday Smoker -- 0.5 packs/day    Types: Cigarettes  . Smokeless tobacco: Never Used  . Alcohol Use: No  . Drug Use: No  . Sexually Active: No   Other Topics Concern  . Not on file   Social History Narrative  . No narrative on file    ROS ALL NEGATIVE EXCEPT THOSE NOTED IN HPI  PE  General Appearance: well developed, well nourished in no acute distress, looks older than stated age HEENT: symmetrical face, PERRLA, disconjugate gaze  Neck: no JVD, thyromegaly, or adenopathy, trachea midline Chest: symmetric without deformity Cardiac: PMI non-displaced, RRR, normal S1, S2, no gallop or murmur Lung: clear to ausculation and percussion Vascular: all pulses full without bruits  Abdominal: nondistended, nontender, good bowel sounds, no HSM, no bruits Extremities: no cyanosis, clubbing or edema, no sign of DVT, no  varicosities  Skin: normal color, no rashes Neuro: alert and oriented x 3, non-focal Pysch: normal affect  EKG  BMET    Component Value Date/Time   NA 139 11/03/2011 1706   K 3.8 11/03/2011 1706   CL 102 11/03/2011 1706   CO2 24 11/03/2011 1706   GLUCOSE 85 11/03/2011 1706   BUN 12 11/03/2011 1706   CREATININE 0.90 11/03/2011 1706   CALCIUM 10.0 11/03/2011 1706   GFRNONAA 67* 11/03/2011 1706   GFRAA 78* 11/03/2011 1706    Lipid Panel  No results found for this basename: chol, trig, hdl, cholhdl, vldl, ldlcalc    CBC    Component Value Date/Time   WBC 6.7 11/03/2011 1706   RBC 4.65 11/03/2011 1706   HGB 14.1 11/03/2011 1706   HCT 42.2 11/03/2011 1706   PLT PLATELET CLUMPS NOTED ON SMEAR, COUNT APPEARS ADEQUATE 11/03/2011 1706   MCV 90.8 11/03/2011 1706   MCH 30.3 11/03/2011 1706   MCHC 33.4 11/03/2011 1706   RDW 13.7 11/03/2011 1706    LYMPHSABS 0.4* 07/29/2011 1310   MONOABS 0.5 07/29/2011 1310   EOSABS 0.0 07/29/2011 1310   BASOSABS 0.0 07/29/2011 1310

## 2011-12-19 NOTE — Patient Instructions (Addendum)
Your physician has requested that you have an echocardiogram. Echocardiography is a painless test that uses sound waves to create images of your heart. It provides your doctor with information about the size and shape of your heart and how well your heart's chambers and valves are working. This procedure takes approximately one hour. There are no restrictions for this procedure.  Your physician recommends that you continue on your current medications as directed. Please refer to the Current Medication list given to you today.  Your physician recommends that you schedule a follow-up appointment as needed with Dr. Wall.  

## 2012-06-22 ENCOUNTER — Telehealth: Payer: Self-pay

## 2012-06-22 NOTE — Telephone Encounter (Signed)
LMOM for a return call. ( Last procedure was 06/06/2012 ) No polyps.

## 2012-06-30 NOTE — Telephone Encounter (Signed)
Error. Last colonoscopy was 04/26/2001  NOT 06/06/2012.  Called and spoke to Bennie Hind, RN who gave me some of the triage information. She is faxing over med list and ins. Info.

## 2012-07-05 NOTE — Telephone Encounter (Signed)
Gastroenterology Pre-Procedure Form     Request Date: 07/05/2012     Requesting Physician: Pearletha Furl, NP     PATIENT INFORMATION:  Kimberly [name] is a 62 y.o., female (DOB=1950-07-20).  PROCEDURE: Procedure(s) requested: colonoscopy Procedure Reason: screening for colon cancer  PATIENT REVIEW QUESTIONS: The patient reports the following:   1. Diabetes Melitis: no 2. Joint replacements in the past 12 months: no 3. Major health problems in the past 3 months: no 4. Has an artificial valve or MVP:no 5. Has been advised in past to take antibiotics in advance of a procedure like teeth cleaning: no}    MEDICATIONS & ALLERGIES:    Patient reports the following regarding taking any blood thinners:   Plavix? no Aspirin?no Coumadin?  no  Patient confirms/reports the following medications:  Current Outpatient Prescriptions  Medication Sig Dispense Refill  . acetaminophen (TYLENOL) 325 MG tablet Take 650 mg by mouth 3 (three) times daily.        . cholecalciferol (VITAMIN D) 400 UNITS TABS Take 800 Units by mouth daily.        . furosemide (LASIX) 20 MG tablet Take 20 mg by mouth daily.        Marland Kitchen levothyroxine (SYNTHROID, LEVOTHROID) 25 MCG tablet Take 25 mcg by mouth daily.        Marland Kitchen LORazepam (ATIVAN) 0.5 MG tablet Take 0.5 mg by mouth 2 (two) times daily.        . Multiple Vitamins-Minerals (MULTIVITAMINS THER. W/MINERALS) TABS Take 1 tablet by mouth daily.        . ondansetron (ZOFRAN) 4 MG tablet Take 4 mg by mouth every 6 (six) hours as needed.       . polyethylene glycol (MIRALAX / GLYCOLAX) packet Take 17 g by mouth daily. constipation       . simvastatin (ZOCOR) 20 MG tablet Take 20 mg by mouth every evening.      Marland Kitchen HYDROcodone-acetaminophen (NORCO/VICODIN) 5-325 MG per tablet Take 2 tablets by mouth every 6 (six) hours as needed.        No current facility-administered medications for this visit.    Patient confirms/reports the following allergies:  No Known  Allergies  Patient is appropriate to schedule for requested procedure(s): yes  AUTHORIZATION INFORMATION Primary Insurance:   ID #:  Group #:  Pre-Cert / Auth required:  Pre-Cert / Auth #:   Secondary Insurance:   ID #:   Group #:  Pre-Cert / Auth required:  Pre-Cert / Auth #:   No orders of the defined types were placed in this encounter.    SCHEDULE INFORMATION: Procedure has been scheduled as follows:  Date:                Time:   Location: [facility]  This Gastroenterology Pre-Precedure Form is being routed to the following provider(s) for review: R. Roetta Sessions, MD

## 2012-07-06 NOTE — Telephone Encounter (Signed)
Appropriate for TCS.

## 2012-07-07 NOTE — Telephone Encounter (Signed)
Noted and agree. 

## 2012-07-07 NOTE — Telephone Encounter (Signed)
I spoke to Gerrit Halls, NP. This pt had a colonoscopy 04/26/2001 and another one 06/06/2009. She did not have polyps and no family hx of colon cancer. Looks like her next one will be in 10 years. I called and informed the nurse, Bennie Hind, RN. So she will not have a colonoscopy at this time. I will fax some info to PCP also.

## 2012-07-12 NOTE — Telephone Encounter (Signed)
Faxed note to PCP along with the last report in 05/2009.

## 2012-07-12 NOTE — Telephone Encounter (Signed)
Routing to [facility] to nic for next TCS.

## 2012-07-13 NOTE — Telephone Encounter (Signed)
Recall made 

## 2012-08-20 ENCOUNTER — Emergency Department (HOSPITAL_COMMUNITY)
Admission: EM | Admit: 2012-08-20 | Discharge: 2012-08-20 | Disposition: A | Discharge status: Home / Self Care | Payer: Medicaid Other | Attending: Emergency Medicine | Admitting: Emergency Medicine

## 2012-08-20 ENCOUNTER — Emergency Department (HOSPITAL_COMMUNITY): Payer: Medicaid Other

## 2012-08-20 ENCOUNTER — Encounter (HOSPITAL_COMMUNITY): Payer: Self-pay

## 2012-08-20 DIAGNOSIS — E039 Hypothyroidism, unspecified: Secondary | ICD-10-CM | POA: Insufficient documentation

## 2012-08-20 DIAGNOSIS — F329 Major depressive disorder, single episode, unspecified: Secondary | ICD-10-CM | POA: Insufficient documentation

## 2012-08-20 DIAGNOSIS — S79929A Unspecified injury of unspecified thigh, initial encounter: Secondary | ICD-10-CM | POA: Insufficient documentation

## 2012-08-20 DIAGNOSIS — J4489 Other specified chronic obstructive pulmonary disease: Secondary | ICD-10-CM | POA: Insufficient documentation

## 2012-08-20 DIAGNOSIS — J449 Chronic obstructive pulmonary disease, unspecified: Secondary | ICD-10-CM | POA: Insufficient documentation

## 2012-08-20 DIAGNOSIS — Z862 Personal history of diseases of the blood and blood-forming organs and certain disorders involving the immune mechanism: Secondary | ICD-10-CM | POA: Insufficient documentation

## 2012-08-20 DIAGNOSIS — F3289 Other specified depressive episodes: Secondary | ICD-10-CM | POA: Insufficient documentation

## 2012-08-20 DIAGNOSIS — Y9289 Other specified places as the place of occurrence of the external cause: Secondary | ICD-10-CM | POA: Insufficient documentation

## 2012-08-20 DIAGNOSIS — Y9389 Activity, other specified: Secondary | ICD-10-CM | POA: Insufficient documentation

## 2012-08-20 DIAGNOSIS — I509 Heart failure, unspecified: Secondary | ICD-10-CM | POA: Insufficient documentation

## 2012-08-20 DIAGNOSIS — S79919A Unspecified injury of unspecified hip, initial encounter: Secondary | ICD-10-CM | POA: Insufficient documentation

## 2012-08-20 DIAGNOSIS — W19XXXA Unspecified fall, initial encounter: Secondary | ICD-10-CM

## 2012-08-20 DIAGNOSIS — IMO0002 Reserved for concepts with insufficient information to code with codable children: Secondary | ICD-10-CM | POA: Insufficient documentation

## 2012-08-20 DIAGNOSIS — F17200 Nicotine dependence, unspecified, uncomplicated: Secondary | ICD-10-CM | POA: Insufficient documentation

## 2012-08-20 DIAGNOSIS — W010XXA Fall on same level from slipping, tripping and stumbling without subsequent striking against object, initial encounter: Secondary | ICD-10-CM | POA: Insufficient documentation

## 2012-08-20 DIAGNOSIS — Z8669 Personal history of other diseases of the nervous system and sense organs: Secondary | ICD-10-CM | POA: Insufficient documentation

## 2012-08-20 DIAGNOSIS — Z8619 Personal history of other infectious and parasitic diseases: Secondary | ICD-10-CM | POA: Insufficient documentation

## 2012-08-20 DIAGNOSIS — Z8701 Personal history of pneumonia (recurrent): Secondary | ICD-10-CM | POA: Insufficient documentation

## 2012-08-20 DIAGNOSIS — Z79899 Other long term (current) drug therapy: Secondary | ICD-10-CM | POA: Insufficient documentation

## 2012-08-20 DIAGNOSIS — Z8739 Personal history of other diseases of the musculoskeletal system and connective tissue: Secondary | ICD-10-CM | POA: Insufficient documentation

## 2012-08-20 HISTORY — DX: Sepsis, unspecified organism: A41.9

## 2012-08-20 NOTE — ED Notes (Signed)
Pt verbalized she did not pass out.  Removed back board maintaining c spine immobilization as directed by Dr. Clarene Duke.   Pt c/o pain in r lower back.

## 2012-08-20 NOTE — ED Notes (Signed)
Pt reports she got up from eating breakfast and accidentally went into the janitor's closet instead of her room.  Pt walks with a walker and tripped in janitor's closet.  Staff told EMS that pt " looked like she passed out."  Pt denies passing out, says was awake the whole time.  Staff did not witness the fall, they heard the patient yelling for help.  EMS says staff reports pt has hearing and visual impairments and has mistaken the janitor's closet for her room before.  Pt alert and oriented.  C/O pain in lower back.  Pt also says both hips feel sore.

## 2012-08-20 NOTE — ED Provider Notes (Signed)
History     CSN: [id number]  Arrival date & time 08/20/12  4098   First MD Initiated Contact with Patient 08/20/12 0935      Chief Complaint  Patient presents with  . Fall     HPI Pt was seen at 0935.  Per EMS, NH report and pt, c/o sudden onset and resolution of one episode of slip and fall that occurred PTA.  Pt states she got up from eating breakfast and accidentally walked into the janitor's closet instead of her room.  Pt and NH staff states she has done this before because of her poor eyesight.  Pt states she was walking backwards with her walker out of the closet when she tripped and fell on the floor.  Staff did not see the pt fall, just found her on the floor.  Pt c/o pain in her low back and hips.  Describes the pain as "sore."  Denies LOC, no syncope, no AMS from baseline since fall, no neck pain, no focal motor weakness, no tingling/numbness in extremities, no prodromal symptoms before fall, no CP/SOB, no abd pain.    Past Medical History  Diagnosis Date  . COPD (chronic obstructive pulmonary disease)   . CHF (congestive heart failure)   . Anemia   . Arthritis     osteoarthritis  . Hypothyroidism   . HOH (hard of hearing)   . Pneumonia   . Shortness of breath   . Depression   . DDD (degenerative disc disease)   . Sepsis     Past Surgical History  Procedure Laterality Date  . Cesarean section    . Arm debridement      as child  . Strabismus surgery      age 56  . Cataract extraction w/phaco  04/28/2011    Procedure: CATARACT EXTRACTION PHACO AND INTRAOCULAR LENS PLACEMENT (IOC);  Surgeon: Susa Simmonds;  Location: [facility];  Service: Ophthalmology;  Laterality: Left;  CDE: 5.37  . Colonoscopy  06/05/2009    RMR: nl rectum, left-sided diverticula, colonoscopy performed due to question of colovesicular fistula     Family History  Problem Relation Age of Onset  . Colon cancer      neg hx?     History  Substance Use Topics  . Smoking status: Current Every  Day Smoker -- 0.50 packs/day    Types: Cigarettes  . Smokeless tobacco: Never Used  . Alcohol Use: No    OB History   Grav Para Term Preterm Abortions TAB SAB Ect Mult Living   2 2 2       2       Review of Systems ROS: Statement: All systems negative except as marked or noted in the HPI; Constitutional: Negative for fever and chills. ; ; Eyes: Negative for eye pain, redness and discharge. ; ; ENMT: Negative for ear pain, hoarseness, nasal congestion, sinus pressure and sore throat. ; ; Cardiovascular: Negative for chest pain, palpitations, diaphoresis, dyspnea and peripheral edema. ; ; Respiratory: Negative for cough, wheezing and stridor. ; ; Gastrointestinal: Negative for nausea, vomiting, diarrhea, abdominal pain, blood in stool, hematemesis, jaundice and rectal bleeding. . ; ; Genitourinary: Negative for dysuria, flank pain and hematuria. ; ; Musculoskeletal: +back pain, hips pain. Negative for neck pain. Negative for swelling and deformity.;; Skin: Negative for pruritus, rash, abrasions, blisters, bruising and skin lesion.; ; Neuro: Negative for headache, lightheadedness and neck stiffness. Negative for weakness, altered level of consciousness , altered mental status, extremity weakness,  paresthesias, involuntary movement, seizure and syncope.       Allergies  Review of patient's allergies indicates no known allergies.  Home Medications   Current Outpatient Rx  Name  Route  Sig  Dispense  Refill  . acetaminophen (TYLENOL) 325 MG tablet   Oral   Take 650 mg by mouth 3 (three) times daily.           . cholecalciferol (VITAMIN D) 400 UNITS TABS   Oral   Take 800 Units by mouth daily.           . ciprofloxacin (CIPRO) 500 MG tablet   Oral   Take 500 mg by mouth 2 (two) times daily. For 14 days Started on 08/16/12         . furosemide (LASIX) 20 MG tablet   Oral   Take 20 mg by mouth daily.           Marland Kitchen levothyroxine (SYNTHROID, LEVOTHROID) 25 MCG tablet   Oral   Take  25 mcg by mouth daily.           Marland Kitchen LORazepam (ATIVAN) 0.5 MG tablet   Oral   Take 0.5 mg by mouth 2 (two) times daily.           . metroNIDAZOLE (FLAGYL) 500 MG tablet   Oral   Take 500 mg by mouth 2 (two) times daily. For 14 days started on 08/16/12         . Multiple Vitamins-Minerals (MULTIVITAMINS THER. W/MINERALS) TABS   Oral   Take 1 tablet by mouth daily.           . polyethylene glycol powder (GLYCOLAX/MIRALAX) powder   Oral   Take 17 g by mouth daily as needed. constipation         . simvastatin (ZOCOR) 20 MG tablet   Oral   Take 20 mg by mouth every evening.         . ondansetron (ZOFRAN) 4 MG tablet   Oral   Take 4 mg by mouth every 6 (six) hours as needed for nausea.            BP 110/74  Pulse 64  Temp(Src) 97.8 F (36.6 C) (Oral)  Resp 18  Ht 5\' 1"  (1.549 m)  Wt 94 lb (42.638 kg)  BMI 17.77 kg/m2  SpO2 100%  Physical Exam 0940: Physical examination: Vital signs and O2 SAT: Reviewed; Constitutional: Thin, frail. In no acute distress; Head and Face: Normocephalic, Atraumatic; Eyes: EOMI, PERRL, No scleral icterus; ENMT: Mouth and pharynx normal, Mucous membranes dry; Neck: Immobilized in C-collar, Trachea midline; Spine: +TTP right lumbar paraspinal muscles. No midline CS, TS, LS tenderness.; Cardiovascular: Regular rate and rhythm, No gallop; Respiratory: Breath sounds clear & equal bilaterally, No rales, rhonchi, wheezes, Normal respiratory effort/excursion; Chest: Nontender, No deformity, Movement normal, No crepitus, No abrasions or ecchymosis.; Abdomen: Soft, Nontender, Nondistended, Normal bowel sounds, No abrasions or ecchymosis.; Genitourinary: No CVA tenderness.; Extremities: No deformity, Full range of motion major/large joints of bilat UE's and LE's without pain or tenderness to palp, Neurovascularly intact, Pulses normal, No tenderness, No edema, Pelvis stable; Neuro: AA&Ox3, +very HOH, poor vision, both per hx; otherwise major CN grossly  intact. Speech clear. No gross focal motor or sensory deficits in extremities.; Skin: Color normal, Warm, Dry   ED Course  Procedures     MDM  MDM Reviewed: nursing note and vitals Interpretation: x-ray and CT scan    Dg Chest 1 View 08/20/2012  *  RADIOLOGY REPORT*  Clinical Data: Fall.  Back pain.  CHEST - 1 VIEW  Comparison: 11/03/2011  Findings: Normal heart size.  There is no pleural effusion or edema.  No airspace consolidation identified.  The lungs appear hyperinflated and there is chronic interstitial coarsening noted bilaterally.  The bones appear osteopenic.  Multilevel compression deformities are noted within the mid and lower thoracic spine.  These are age indeterminate.  IMPRESSION:  1.  No acute cardiopulmonary abnormalities. 2.  COPD. 3.  Multilevel, age indeterminate thoracic compression deformities.   Original Report Authenticated By: Signa Kell, M.D.    Dg Lumbar Spine Complete 08/20/2012  *RADIOLOGY REPORT*  Clinical Data: Recent fall with back pain  LUMBAR SPINE - COMPLETE 4+ VIEW  Comparison: 05/11/2009.  Findings: Five lumbar type vertebral bodies are well visualized. Lumbar Vertebral body height is well-maintained.  A T12 compression deformity is noted which is chronic from prior exam of 2010. Disc space narrowing is noted at L4-5 L5 S1 of a chronic nature.  No spondylolysis or spondylolisthesis is seen.  IMPRESSION: Chronic changes without acute abnormality.   Original Report Authenticated By: Alcide Clever, M.D.    Dg Pelvis 1-2 Views 08/20/2012  *RADIOLOGY REPORT*  Clinical Data: Recent injury with back pain  PELVIS - 1-2 VIEW  Comparison: None.  Findings: No acute fracture or dislocation is noted.  Mild degenerative changes of the hip joints are seen.  No soft tissue abnormality is seen.  IMPRESSION: No acute abnormality noted.   Original Report Authenticated By: Alcide Clever, M.D.    Ct Head Wo Contrast 08/20/2012  *RADIOLOGY REPORT*  Clinical Data:  fall,FALL. SYNCOPE   CT HEAD WITHOUT CONTRAST CT CERVICAL SPINE WITHOUT CONTRAST  Technique:  Multidetector CT imaging of the head and cervical spine was performed following the standard protocol without IV contrast. Multiplanar CT image reconstructions of the cervical spine were also generated.  Comparison: None  CT HEAD  Findings: Mild atrophy. There is no evidence of acute intracranial hemorrhage, brain edema, mass lesion, acute infarction,   mass effect, or midline shift. Acute infarct may be inapparent on noncontrast CT.  No other intra-axial abnormalities are seen, and the ventricles and sulci are within normal limits in size and symmetry.   No abnormal extra-axial fluid collections or masses are identified.  No significant calvarial abnormality.  IMPRESSION: 1. Negative for bleed or other acute intracranial process.  CT CERVICAL SPINE  Findings: 2 mm anterolisthesis C4-5.  Facets seated.  Mild narrowing of C5-6 and C6-7 interspaces with anterior and posterior endplate spurring.  Negative for fracture.  Heterogeneous left thyroid enlargement with rightward tracheal deviation is partially seen.  No prevertebral soft tissue swelling.  Asymmetric uncovertebral spurring results in right foraminal encroachment C5-6 and C6-7.  IMPRESSION:  1.  Negative for fracture. 2.  Mild anterolisthesis C4-5, of uncertain acuity or stability. 3.  Degenerative changes C5-C7 as above. 4.  Heterogeneous left thyroid enlargement, incompletely evaluated.   Original Report Authenticated By: D. Andria Rhein, MD    Ct Cervical Spine Wo Contrast 08/20/2012  *RADIOLOGY REPORT*  Clinical Data:  fall,FALL. SYNCOPE  CT HEAD WITHOUT CONTRAST CT CERVICAL SPINE WITHOUT CONTRAST  Technique:  Multidetector CT imaging of the head and cervical spine was performed following the standard protocol without IV contrast. Multiplanar CT image reconstructions of the cervical spine were also generated.  Comparison: None  CT HEAD  Findings: Mild atrophy. There is no evidence  of acute intracranial hemorrhage, brain edema, mass lesion, acute infarction,  mass effect, or midline shift. Acute infarct may be inapparent on noncontrast CT.  No other intra-axial abnormalities are seen, and the ventricles and sulci are within normal limits in size and symmetry.   No abnormal extra-axial fluid collections or masses are identified.  No significant calvarial abnormality.  IMPRESSION: 1. Negative for bleed or other acute intracranial process.  CT CERVICAL SPINE  Findings: 2 mm anterolisthesis C4-5.  Facets seated.  Mild narrowing of C5-6 and C6-7 interspaces with anterior and posterior endplate spurring.  Negative for fracture.  Heterogeneous left thyroid enlargement with rightward tracheal deviation is partially seen.  No prevertebral soft tissue swelling.  Asymmetric uncovertebral spurring results in right foraminal encroachment C5-6 and C6-7.  IMPRESSION:  1.  Negative for fracture. 2.  Mild anterolisthesis C4-5, of uncertain acuity or stability. 3.  Degenerative changes C5-C7 as above. 4.  Heterogeneous left thyroid enlargement, incompletely evaluated.   Original Report Authenticated By: D. Andria Rhein, MD       1215:  CT and XR above without acute abnl. Will tx LBP symptomatically for now. No midline CS tenderness, FROM CS without midline tenderness. No NMS changes.  C-collar removed.  Pt wants to go back to the NH now.  Dx and testing d/w pt.  Questions answered.  Verb understanding, agreeable to d/c back to nursing home with outpt f/u.         Laray Anger, DO 08/22/12 1340

## 2013-06-23 ENCOUNTER — Ambulatory Visit: Payer: Medicaid Other | Admitting: Neurology

## 2013-07-15 ENCOUNTER — Encounter: Payer: Self-pay | Admitting: Neurology

## 2013-07-19 ENCOUNTER — Ambulatory Visit: Payer: Medicaid Other | Admitting: Neurology

## 2013-08-02 ENCOUNTER — Encounter: Payer: Self-pay | Admitting: Neurology

## 2013-08-02 ENCOUNTER — Ambulatory Visit (INDEPENDENT_AMBULATORY_CARE_PROVIDER_SITE_OTHER): Payer: Medicaid Other | Admitting: Neurology

## 2013-08-02 VITALS — BP 119/72 | HR 85 | Ht 61.0 in | Wt 94.0 lb

## 2013-08-02 DIAGNOSIS — D518 Other vitamin B12 deficiency anemias: Secondary | ICD-10-CM

## 2013-08-02 DIAGNOSIS — R413 Other amnesia: Secondary | ICD-10-CM | POA: Insufficient documentation

## 2013-08-02 DIAGNOSIS — R269 Unspecified abnormalities of gait and mobility: Secondary | ICD-10-CM

## 2013-08-02 DIAGNOSIS — E43 Unspecified severe protein-calorie malnutrition: Secondary | ICD-10-CM

## 2013-08-02 DIAGNOSIS — E41 Nutritional marasmus: Secondary | ICD-10-CM

## 2013-08-02 HISTORY — DX: Unspecified abnormalities of gait and mobility: R26.9

## 2013-08-02 HISTORY — DX: Other amnesia: R41.3

## 2013-08-02 MED ORDER — DONEPEZIL HCL 5 MG PO TABS: 5.0000 mg | ORAL_TABLET | Freq: Every day | ORAL | Status: DC

## 2013-08-02 NOTE — Progress Notes (Signed)
Reason for visit: Memory disorder, gait disorder  Kimberly [name] is a 63 y.o. female  History of present illness:  Kimberly [name] is a 63 year old left-handed white female with a history of blindness, etiology unknown. The patient does not know why she lost her vision, but this has been a problem for number of years. The patient has been residing in an extended care facility, and according to her caretaker, over the last 3 years, the patient has had a significant weight loss going from around 150 pounds to 85 pounds today. The patient has also had a progressive gait disorder, and she has been using a walker for number of years, the patient herself indicates that she has used a walker for greater than 10 years. The patient is veering to one side or the other with walking. The patient has not had any recent falls. The memory issue has also progressively worsened. The patient is having hallucinations at times, and she is accusing people of saying bad things about her. She is followed through psychiatry. The patient denies any headaches, and she denies any numbness or weakness on the arms or legs. The patient denies any back pain or leg pain or neck discomfort. The patient has been placed on Aricept and Namenda, and she is having some occasional episodes of diarrhea. The patient denies problems controlling the bladder. The patient is sent to this office for further evaluation.  Past Medical History  Diagnosis Date  . COPD (chronic obstructive pulmonary disease)   . CHF (congestive heart failure)   . Anemia   . Arthritis     osteoarthritis  . Hypothyroidism   . HOH (hard of hearing)   . Pneumonia   . Shortness of breath   . Depression   . DDD (degenerative disc disease)   . Sepsis   . Memory disorder 08/02/2013  . Abnormality of gait 08/02/2013  . Blind     Past Surgical History  Procedure Laterality Date  . Cesarean section    . Arm debridement      as child  . Strabismus surgery     age 50  . Cataract extraction w/phaco  04/28/2011    Procedure: CATARACT EXTRACTION PHACO AND INTRAOCULAR LENS PLACEMENT (IOC);  Surgeon: Susa Simmonds;  Location: [facility];  Service: Ophthalmology;  Laterality: Left;  CDE: 5.37  . Colonoscopy  06/05/2009    RMR: nl rectum, left-sided diverticula, colonoscopy performed due to question of colovesicular fistula     Family History  Problem Relation Age of Onset  . Colon cancer      neg hx?   . Diabetes Brother   . Diabetes Brother   . Diabetes Mother   . Cancer - Lung Father     Social history:  reports that she has been smoking Cigarettes.  She has been smoking about 0.50 packs per day. She has never used smokeless tobacco. She reports that she does not drink alcohol or use illicit drugs.  Medications:  Current Outpatient Prescriptions on File Prior to Visit  Medication Sig Dispense Refill  . acetaminophen (TYLENOL) 325 MG tablet Take 650 mg by mouth 3 (three) times daily.        . cholecalciferol (VITAMIN D) 400 UNITS TABS Take 800 Units by mouth daily.        . furosemide (LASIX) 20 MG tablet Take 20 mg by mouth daily.        Marland Kitchen levothyroxine (SYNTHROID, LEVOTHROID) 25 MCG tablet Take 25 mcg  by mouth daily.        Marland Kitchen LORazepam (ATIVAN) 0.5 MG tablet Take 0.5 mg by mouth 2 (two) times daily.        . Multiple Vitamins-Minerals (MULTIVITAMINS THER. W/MINERALS) TABS Take 1 tablet by mouth daily.        . ondansetron (ZOFRAN) 4 MG tablet Take 4 mg by mouth every 6 (six) hours as needed for nausea.       . polyethylene glycol powder (GLYCOLAX/MIRALAX) powder Take 17 g by mouth daily as needed. constipation      . simvastatin (ZOCOR) 20 MG tablet Take 20 mg by mouth every evening.       No current facility-administered medications on file prior to visit.     No Known Allergies  ROS:  Out of a complete 14 system review of symptoms, the patient complains only of the following symptoms, and all other reviewed systems are negative.  Gait  disorder Memory disorder Diarrhea  Blood pressure 119/72, pulse 85, height 5\' 1"  (1.549 m), weight 94 lb (42.638 kg).  Physical Exam  General: The patient is alert and cooperative at the time of the examination.  Eyes: Pupils are equal, round, and reactive to light. Discs are flat bilaterally.  Neck: The neck is supple, no carotid bruits are noted.  Respiratory: The respiratory examination is clear.  Cardiovascular: The cardiovascular examination reveals a regular rate and rhythm, no obvious murmurs or rubs are noted.  Skin: Extremities are without significant edema.  Neurologic Exam  Mental status: The Mini-Mental status examination done today shows a total score of 5/30.  Cranial nerves: Facial symmetry is not present. There is a depression of the right nasolabial fold. There is good sensation of the face to pinprick and soft touch bilaterally. The strength of the facial muscles and the muscles to head turning and shoulder shrug are normal bilaterally. Speech is well enunciated, no aphasia or dysarthria is noted. Extraocular movements are full. The patient is blind, unable to count fingers. The tongue is midline, and the patient has symmetric elevation of the soft palate. The patient is hard of hearing.  Motor: The motor testing reveals 5 over 5 strength of all 4 extremities. Good symmetric motor tone is noted throughout.  Sensory: Sensory testing is intact to pinprick, soft touch, vibration sensation, and position sense on all 4 extremities, with the exception that there is a depression of position sense in the lower extremities, left greater than right. No evidence of extinction is noted.  Coordination: Cerebellar testing reveals good finger-nose-finger bilaterally. The patient is not able to perform heel-to-shin on either side secondary to severe apraxia.  Gait and station: Gait is wide-based, unsteady. The patient is able to ambulate with a walker. Tandem gait was not attempted.  Romberg is negative.  Reflexes: Deep tendon reflexes are symmetric and normal bilaterally, with exception that the ankle jerk reflexes are depressed bilaterally. Toes are downgoing bilaterally.   Assessment/Plan:  1. Progressive memory disorder  2. Progressive gait disorder  3. Blindness  4. Weight loss, malnutrition  The patient is having progression of her underlying neurologic symptoms. The patient will be sent for CT scan of the brain, and she will undergo blood work today. Given the weight loss and diarrhea, the Aricept will need to be cut back to 5 mg at night, and if the weight does not stabilize, this medication will need to be discontinued. The patient is at risk for significant malnutrition. The patient will followup in 3-4 months.  Marlan Palau. Keith Amoreena Neubert MD 08/02/2013 7:52 PM  [facility] [address] [place], [place] [id number]  Phone [phone] Fax [phone]

## 2013-08-02 NOTE — Patient Instructions (Signed)

## 2013-08-04 LAB — HIV ANTIBODY (ROUTINE TESTING W REFLEX): HIV-1/HIV-2 Ab: NONREACTIVE

## 2013-08-04 LAB — RPR: RPR: NONREACTIVE

## 2013-08-04 LAB — VITAMIN B1, WHOLE BLOOD: THIAMINE: 178.4 nmol/L (ref 66.5–200.0)

## 2013-08-04 LAB — VITAMIN B12: VITAMIN B 12: 1178 pg/mL — AB (ref 211–946)

## 2013-08-04 LAB — COPPER, SERUM: COPPER: 106 ug/dL (ref 72–166)

## 2013-08-05 NOTE — Progress Notes (Signed)
Quick Note:  Shared unremarkable labs with tech at assisted living facility, she verbalized understanding ______

## 2013-08-11 ENCOUNTER — Other Ambulatory Visit: Payer: Medicaid Other

## 2013-08-11 ENCOUNTER — Ambulatory Visit
Admission: RE | Admit: 2013-08-11 | Discharge: 2013-08-11 | Disposition: A | Discharge status: Home / Self Care | Payer: Medicaid Other | Source: Ambulatory Visit | Attending: Neurology | Admitting: Neurology

## 2013-08-11 DIAGNOSIS — R413 Other amnesia: Secondary | ICD-10-CM

## 2013-08-11 DIAGNOSIS — R269 Unspecified abnormalities of gait and mobility: Secondary | ICD-10-CM

## 2013-08-11 DIAGNOSIS — E43 Unspecified severe protein-calorie malnutrition: Secondary | ICD-10-CM

## 2013-08-15 ENCOUNTER — Telehealth: Payer: Self-pay | Admitting: Neurology

## 2013-08-15 NOTE — Telephone Encounter (Signed)
The CT the head done does not show any changes from one year ago. Blood work was unremarkable. The patient is malnourished, no other significant etiology of the walking issues.   CT head 08/12/2013:  IMPRESSION: Abnormal CT scan of the head showing mild changes of generalized cerebral atrophy and chronic microvascular ischemia. No significant change compared with previous CT head 08/20/2012.

## 2013-08-17 ENCOUNTER — Other Ambulatory Visit: Payer: Self-pay | Admitting: Neurology

## 2013-11-02 ENCOUNTER — Emergency Department (HOSPITAL_COMMUNITY): Payer: Medicaid Other

## 2013-11-02 ENCOUNTER — Encounter (HOSPITAL_COMMUNITY): Payer: Self-pay | Admitting: Emergency Medicine

## 2013-11-02 ENCOUNTER — Observation Stay (HOSPITAL_COMMUNITY): Payer: Medicaid Other

## 2013-11-02 ENCOUNTER — Inpatient Hospital Stay (HOSPITAL_COMMUNITY)
Admission: EM | Admit: 2013-11-02 | Discharge: 2013-11-04 | DRG: 309 | Disposition: A | Discharge status: Home / Self Care | Payer: Medicaid Other | Attending: Family Medicine | Admitting: Family Medicine

## 2013-11-02 DIAGNOSIS — R001 Bradycardia, unspecified: Secondary | ICD-10-CM | POA: Diagnosis present

## 2013-11-02 DIAGNOSIS — W010XXA Fall on same level from slipping, tripping and stumbling without subsequent striking against object, initial encounter: Secondary | ICD-10-CM | POA: Diagnosis present

## 2013-11-02 DIAGNOSIS — N321 Vesicointestinal fistula: Secondary | ICD-10-CM

## 2013-11-02 DIAGNOSIS — I5032 Chronic diastolic (congestive) heart failure: Secondary | ICD-10-CM | POA: Diagnosis present

## 2013-11-02 DIAGNOSIS — Z800 Family history of malignant neoplasm of digestive organs: Secondary | ICD-10-CM

## 2013-11-02 DIAGNOSIS — H547 Unspecified visual loss: Secondary | ICD-10-CM | POA: Diagnosis present

## 2013-11-02 DIAGNOSIS — J449 Chronic obstructive pulmonary disease, unspecified: Secondary | ICD-10-CM | POA: Diagnosis present

## 2013-11-02 DIAGNOSIS — F17200 Nicotine dependence, unspecified, uncomplicated: Secondary | ICD-10-CM | POA: Diagnosis present

## 2013-11-02 DIAGNOSIS — D649 Anemia, unspecified: Secondary | ICD-10-CM

## 2013-11-02 DIAGNOSIS — Z720 Tobacco use: Secondary | ICD-10-CM

## 2013-11-02 DIAGNOSIS — F0390 Unspecified dementia without behavioral disturbance: Secondary | ICD-10-CM | POA: Diagnosis present

## 2013-11-02 DIAGNOSIS — I509 Heart failure, unspecified: Secondary | ICD-10-CM | POA: Diagnosis present

## 2013-11-02 DIAGNOSIS — Z833 Family history of diabetes mellitus: Secondary | ICD-10-CM

## 2013-11-02 DIAGNOSIS — S0990XA Unspecified injury of head, initial encounter: Secondary | ICD-10-CM | POA: Diagnosis present

## 2013-11-02 DIAGNOSIS — I498 Other specified cardiac arrhythmias: Principal | ICD-10-CM | POA: Diagnosis present

## 2013-11-02 DIAGNOSIS — R55 Syncope and collapse: Secondary | ICD-10-CM | POA: Diagnosis present

## 2013-11-02 DIAGNOSIS — J4489 Other specified chronic obstructive pulmonary disease: Secondary | ICD-10-CM | POA: Diagnosis present

## 2013-11-02 DIAGNOSIS — E039 Hypothyroidism, unspecified: Secondary | ICD-10-CM | POA: Diagnosis present

## 2013-11-02 DIAGNOSIS — H9190 Unspecified hearing loss, unspecified ear: Secondary | ICD-10-CM | POA: Diagnosis present

## 2013-11-02 DIAGNOSIS — D72829 Elevated white blood cell count, unspecified: Secondary | ICD-10-CM

## 2013-11-02 DIAGNOSIS — Y9210 Unspecified residential institution as the place of occurrence of the external cause: Secondary | ICD-10-CM | POA: Diagnosis present

## 2013-11-02 DIAGNOSIS — Z79899 Other long term (current) drug therapy: Secondary | ICD-10-CM

## 2013-11-02 DIAGNOSIS — R413 Other amnesia: Secondary | ICD-10-CM

## 2013-11-02 DIAGNOSIS — Z8673 Personal history of transient ischemic attack (TIA), and cerebral infarction without residual deficits: Secondary | ICD-10-CM

## 2013-11-02 DIAGNOSIS — H543 Unqualified visual loss, both eyes: Secondary | ICD-10-CM | POA: Diagnosis present

## 2013-11-02 DIAGNOSIS — M1990 Unspecified osteoarthritis, unspecified site: Secondary | ICD-10-CM | POA: Diagnosis present

## 2013-11-02 DIAGNOSIS — R269 Unspecified abnormalities of gait and mobility: Secondary | ICD-10-CM

## 2013-11-02 DIAGNOSIS — R109 Unspecified abdominal pain: Secondary | ICD-10-CM

## 2013-11-02 HISTORY — DX: Tobacco use: Z72.0

## 2013-11-02 LAB — URINALYSIS, ROUTINE W REFLEX MICROSCOPIC
Bilirubin Urine: NEGATIVE
Glucose, UA: NEGATIVE mg/dL
Hgb urine dipstick: NEGATIVE
Ketones, ur: NEGATIVE mg/dL
Leukocytes, UA: NEGATIVE
NITRITE: NEGATIVE
PH: 6 (ref 5.0–8.0)
PROTEIN: NEGATIVE mg/dL
Specific Gravity, Urine: <1.005 — ABNORMAL LOW (ref 1.005–1.030)
Urobilinogen, UA: 0.2 mg/dL (ref 0.0–1.0)

## 2013-11-02 LAB — COMPREHENSIVE METABOLIC PANEL
ALK PHOS: 73 U/L (ref 39–117)
ALT: 46 U/L — ABNORMAL HIGH (ref 0–35)
AST: 36 U/L (ref 0–37)
Albumin: 3.6 g/dL (ref 3.5–5.2)
BUN: 15 mg/dL (ref 6–23)
CO2: 28 mEq/L (ref 19–32)
CREATININE: 0.78 mg/dL (ref 0.50–1.10)
Calcium: 9.4 mg/dL (ref 8.4–10.5)
Chloride: 103 mEq/L (ref 96–112)
GFR calc non Af Amer: 87 mL/min — ABNORMAL LOW (ref >90)
Glucose, Bld: 84 mg/dL (ref 70–99)
Potassium: 4.6 mEq/L (ref 3.7–5.3)
Sodium: 143 mEq/L (ref 137–147)
TOTAL PROTEIN: 6.6 g/dL (ref 6.0–8.3)
Total Bilirubin: <0.2 mg/dL — ABNORMAL LOW (ref 0.3–1.2)

## 2013-11-02 LAB — CBC WITH DIFFERENTIAL/PLATELET
BASOS PCT: 0 % (ref 0–1)
Basophils Absolute: 0 10*3/uL (ref 0.0–0.1)
EOS ABS: 0.1 10*3/uL (ref 0.0–0.7)
EOS PCT: 1 % (ref 0–5)
HEMATOCRIT: 38.5 % (ref 36.0–46.0)
HEMOGLOBIN: 12.8 g/dL (ref 12.0–15.0)
LYMPHS ABS: 1.2 10*3/uL (ref 0.7–4.0)
Lymphocytes Relative: 16 % (ref 12–46)
MCH: 30.8 pg (ref 26.0–34.0)
MCHC: 33.2 g/dL (ref 30.0–36.0)
MCV: 92.8 fL (ref 78.0–100.0)
MONO ABS: 0.4 10*3/uL (ref 0.1–1.0)
MONOS PCT: 5 % (ref 3–12)
Neutro Abs: 5.6 10*3/uL (ref 1.7–7.7)
Neutrophils Relative %: 78 % — ABNORMAL HIGH (ref 43–77)
Platelets: 267 10*3/uL (ref 150–400)
RBC: 4.15 MIL/uL (ref 3.87–5.11)
RDW: 13.4 % (ref 11.5–15.5)
WBC: 7.3 10*3/uL (ref 4.0–10.5)

## 2013-11-02 LAB — TROPONIN I
Troponin I: <0.3 ng/mL (ref <0.30)
Troponin I: <0.3 ng/mL (ref <0.30)

## 2013-11-02 LAB — PRO B NATRIURETIC PEPTIDE: Pro B Natriuretic peptide (BNP): 151.7 pg/mL — ABNORMAL HIGH (ref 0–125)

## 2013-11-02 LAB — MRSA PCR SCREENING: MRSA by PCR: NEGATIVE

## 2013-11-02 LAB — CBG MONITORING, ED: Glucose-Capillary: 81 mg/dL (ref 70–99)

## 2013-11-02 MED ORDER — ADULT MULTIVITAMIN W/MINERALS CH
1.0000 | ORAL_TABLET | Freq: Every day | ORAL | Status: DC
  Administered 2013-11-03 – 2013-11-04 (×2): 1 via ORAL
  Filled 2013-11-02 (×2): qty 1

## 2013-11-02 MED ORDER — ALBUTEROL SULFATE (2.5 MG/3ML) 0.083% IN NEBU: 2.5000 mg | INHALATION_SOLUTION | RESPIRATORY_TRACT | Status: DC | PRN

## 2013-11-02 MED ORDER — HYDROCODONE-ACETAMINOPHEN 5-325 MG PO TABS: 1.0000 | ORAL_TABLET | ORAL | Status: DC | PRN

## 2013-11-02 MED ORDER — SIMVASTATIN 20 MG PO TABS
20.0000 mg | ORAL_TABLET | Freq: Every evening | ORAL | Status: DC
  Administered 2013-11-02 – 2013-11-03 (×2): 20 mg via ORAL
  Filled 2013-11-02 (×2): qty 1

## 2013-11-02 MED ORDER — ONDANSETRON HCL 4 MG/2ML IJ SOLN
4.0000 mg | Freq: Four times a day (QID) | INTRAMUSCULAR | Status: DC | PRN
Start: 2013-11-02 — End: 2013-11-04

## 2013-11-02 MED ORDER — MEMANTINE HCL 10 MG PO TABS
10.0000 mg | ORAL_TABLET | Freq: Two times a day (BID) | ORAL | Status: DC
  Administered 2013-11-02 – 2013-11-04 (×4): 10 mg via ORAL
  Filled 2013-11-02 (×4): qty 1

## 2013-11-02 MED ORDER — HEPARIN SODIUM (PORCINE) 5000 UNIT/ML IJ SOLN
5000.0000 [IU] | Freq: Three times a day (TID) | INTRAMUSCULAR | Status: DC
  Administered 2013-11-02 – 2013-11-04 (×6): 5000 [IU] via SUBCUTANEOUS
  Filled 2013-11-02 (×7): qty 1

## 2013-11-02 MED ORDER — GUAIFENESIN-DM 100-10 MG/5ML PO SYRP: 5.0000 mL | ORAL_SOLUTION | ORAL | Status: DC | PRN

## 2013-11-02 MED ORDER — ASPIRIN 81 MG PO CHEW
81.0000 mg | CHEWABLE_TABLET | Freq: Every day | ORAL | Status: DC
  Administered 2013-11-03 – 2013-11-04 (×2): 81 mg via ORAL
  Filled 2013-11-02 (×2): qty 1

## 2013-11-02 MED ORDER — SODIUM CHLORIDE 0.9 % IV SOLN: INTRAVENOUS | Status: DC

## 2013-11-02 MED ORDER — FUROSEMIDE 20 MG PO TABS
20.0000 mg | ORAL_TABLET | Freq: Every day | ORAL | Status: DC
  Administered 2013-11-03: 20 mg via ORAL
  Filled 2013-11-02: qty 1

## 2013-11-02 MED ORDER — POLYETHYLENE GLYCOL 3350 17 GM/SCOOP PO POWD
17.0000 g | Freq: Every day | ORAL | Status: DC | PRN
  Filled 2013-11-02: qty 255

## 2013-11-02 MED ORDER — SERTRALINE HCL 50 MG PO TABS
50.0000 mg | ORAL_TABLET | Freq: Every day | ORAL | Status: DC
  Administered 2013-11-02 – 2013-11-03 (×2): 50 mg via ORAL
  Filled 2013-11-02 (×2): qty 1

## 2013-11-02 MED ORDER — CHOLECALCIFEROL 10 MCG (400 UNIT) PO TABS
800.0000 [IU] | ORAL_TABLET | Freq: Every day | ORAL | Status: DC
  Administered 2013-11-03 – 2013-11-04 (×2): 800 [IU] via ORAL
  Filled 2013-11-02 (×2): qty 2

## 2013-11-02 MED ORDER — POTASSIUM CHLORIDE IN NACL 20-0.9 MEQ/L-% IV SOLN
INTRAVENOUS | Status: DC
  Administered 2013-11-02 – 2013-11-03 (×2): via INTRAVENOUS

## 2013-11-02 MED ORDER — ONDANSETRON HCL 4 MG PO TABS: 4.0000 mg | ORAL_TABLET | Freq: Four times a day (QID) | ORAL | Status: DC | PRN

## 2013-11-02 MED ORDER — DONEPEZIL HCL 5 MG PO TABS
5.0000 mg | ORAL_TABLET | Freq: Every day | ORAL | Status: DC
  Administered 2013-11-02 – 2013-11-03 (×2): 5 mg via ORAL
  Filled 2013-11-02 (×2): qty 1

## 2013-11-02 MED ORDER — DOCUSATE SODIUM 100 MG PO CAPS
100.0000 mg | ORAL_CAPSULE | Freq: Two times a day (BID) | ORAL | Status: DC
  Administered 2013-11-02 – 2013-11-04 (×4): 100 mg via ORAL
  Filled 2013-11-02 (×4): qty 1

## 2013-11-02 MED ORDER — MIRTAZAPINE 15 MG PO TABS
7.5000 mg | ORAL_TABLET | Freq: Every day | ORAL | Status: DC
  Administered 2013-11-02 – 2013-11-03 (×2): 7.5 mg via ORAL
  Filled 2013-11-02 (×3): qty 0.5
  Filled 2013-11-02 (×2): qty 1

## 2013-11-02 MED ORDER — LEVOTHYROXINE SODIUM 25 MCG PO TABS
25.0000 ug | ORAL_TABLET | Freq: Every day | ORAL | Status: DC
  Administered 2013-11-03 – 2013-11-04 (×2): 25 ug via ORAL
  Filled 2013-11-02 (×2): qty 1

## 2013-11-02 MED ORDER — LORAZEPAM 0.5 MG PO TABS
0.5000 mg | ORAL_TABLET | Freq: Two times a day (BID) | ORAL | Status: DC
  Administered 2013-11-02 – 2013-11-04 (×4): 0.5 mg via ORAL
  Filled 2013-11-02 (×4): qty 1

## 2013-11-02 MED ORDER — ACETAMINOPHEN 325 MG PO TABS
650.0000 mg | ORAL_TABLET | Freq: Four times a day (QID) | ORAL | Status: DC | PRN
  Administered 2013-11-04: 650 mg via ORAL
  Filled 2013-11-02: qty 2

## 2013-11-02 MED ORDER — ACETAMINOPHEN 650 MG RE SUPP: 650.0000 mg | Freq: Four times a day (QID) | RECTAL | Status: DC | PRN

## 2013-11-02 MED ORDER — LEVOTHYROXINE SODIUM 25 MCG PO TABS: 25.0000 ug | ORAL_TABLET | Freq: Every day | ORAL | Status: DC

## 2013-11-02 NOTE — H&P (Signed)
[facility] History and Physical  DONNETTE MACMULLEN GNF:[id number] DOB: 04-20-1951 DOA: 11/02/2013  Referring physician: ED MD Dr. Marlene Lard PCP: Calvert Cantor, MD   Chief Complaint: Reported syncopal episode.  HPI: Kimberly [name] is a 63 y.o. female with a history of COPD, CHF, memory disorder, and gait disorder, who presents from a local assisted living facility [facility]) after she fell at the facility. The patient does not recall what happened except that she felt dizzy and she ended up on the floor. According to the ED notes, the nursing facility personnel stated that she slipped and fell backwards. They also reported a loss of consciousness. Currently, the patient has no headache, shortness of breath, chest pain, palpitations, or joint pain. She denies any recent nausea, vomiting, or diarrhea. All she remembers this morning prior to falling or passing out was that she became dizzy and fell. She does not recall having any other episodes similar to this in the past.  In the emergency department, she is afebrile and hemodynamically stable. She was transiently bradycardic with a heart rate of 56 beats per minute, but it is now 76 beats per minute. Her lab data are virtually unremarkable. MRI of her cervical spine reveals arthropathy but no acute or dramatic finding. 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 Her urinalysis is unremarkable. Her EKG reveals sinus bradycardia with a heart rate of 56 beats per minute. She is being admitted for further evaluation and management.    Review of Systems:  As above in history present illness. She also reports being hard of hearing. She reports being blind. She in place with a walker. Otherwise, review of systems is negative.   Past Medical History  Diagnosis Date  . COPD (chronic obstructive pulmonary disease)   .  CHF (congestive heart failure)   . Anemia   . Arthritis     osteoarthritis  . Hypothyroidism   . HOH (hard of hearing)   . Pneumonia   . Shortness of breath   . Depression   . DDD (degenerative disc disease)   . Sepsis   . Memory disorder 08/02/2013  . Abnormality of gait 08/02/2013  . Blind   . Tobacco abuse 11/02/2013   Past Surgical History  Procedure Laterality Date  . Cesarean section    . Arm debridement      as child  . Strabismus surgery      age 25  . Cataract extraction w/phaco  04/28/2011    Procedure: CATARACT EXTRACTION PHACO AND INTRAOCULAR LENS PLACEMENT (IOC);  Surgeon: Susa Simmonds;  Location: [facility];  Service: Ophthalmology;  Laterality: Left;  CDE: 5.37  . Colonoscopy  06/05/2009    RMR: nl rectum, left-sided diverticula, colonoscopy performed due to question of colovesicular fistula    Social History: She is a resident of [facility]. She smokes 4-5 cigarettes daily. She denies alcohol or illicit drug use. She ambulates with a walker. She is divorced. She has 2 children.   No Known Allergies  Family History  Problem Relation Age of Onset  . Colon cancer      neg hx?   . Diabetes Brother   . Diabetes Brother   . Diabetes Mother   . Cancer - Lung Father      Prior to Admission medications   Medication Sig Start Date End Date Taking? Authorizing Provider  acetaminophen (  TYLENOL) 325 MG tablet Take 650 mg by mouth 3 (three) times daily.     Yes Historical Provider, MD  cholecalciferol (VITAMIN D) 400 UNITS TABS Take 800 Units by mouth daily.     Yes Historical Provider, MD  donepezil (ARICEPT) 5 MG tablet TAKE 1 TABLET BY MOUTH AT BEDTIME. 08/17/13  Yes York Spaniel, MD  levothyroxine (SYNTHROID, LEVOTHROID) 25 MCG tablet Take 25 mcg by mouth daily.     Yes Historical Provider, MD  LORazepam (ATIVAN) 0.5 MG tablet Take 0.5 mg by mouth 2 (two) times daily.    Yes Historical Provider, MD  memantine (NAMENDA) 10 MG tablet Take 10 mg by mouth 2 (two)  times daily.   Yes Historical Provider, MD  mirtazapine (REMERON) 15 MG tablet Take 7.5 mg by mouth at bedtime.   Yes Historical Provider, MD  Multiple Vitamins-Minerals (MULTIVITAMINS THER. W/MINERALS) TABS Take 1 tablet by mouth daily.     Yes Historical Provider, MD  sertraline (ZOLOFT) 50 MG tablet Take 50 mg by mouth at bedtime.   Yes Historical Provider, MD  simvastatin (ZOCOR) 20 MG tablet Take 20 mg by mouth every evening.   Yes Historical Provider, MD  furosemide (LASIX) 20 MG tablet Take 20 mg by mouth daily.      Historical Provider, MD  ondansetron (ZOFRAN) 4 MG tablet Take 4 mg by mouth every 6 (six) hours as needed for nausea.  11/04/11   Historical Provider, MD  polyethylene glycol powder (GLYCOLAX/MIRALAX) powder Take 17 g by mouth daily as needed. constipation    Historical Provider, MD   Physical Exam: Filed Vitals:   11/02/13 1741  BP: 124/61  Pulse:   Temp:   Resp:     BP 124/61  Pulse 93  Temp(Src) 98.4 F (36.9 C) (Oral)  Resp 16  Ht 5\' 1"  (1.549 m)  Wt 45.36 kg (100 lb)  BMI 18.90 kg/m2  SpO2 95%  General:  Appears calm and comfortable Eyes: PERRL, normal lids, irises & conjunctiva ENT: Oropharynx reveals dry mucosa membranes. She is slightly hard of hearing. Nasal mucosa is dry with no rhinorrhea. Tympanic membranes not examined. Neck: no LAD, masses or thyromegaly Cardiovascular: S1, S2, with no murmurs rubs or gallops. Pedal pulses palpable. No pedal edema. Telemetry: SR, no arrhythmias  Respiratory: CTA bilaterally, no w/r/r. Normal respiratory effort. Abdomen: soft, ntnd, positive bowel sounds, well-healed vertical scar, no masses palpated. Skin: no rash or induration seen on limited exam Musculoskeletal: grossly normal tone BUE/BLE Psychiatric: grossly normal mood and affect, speech fluent and appropriate Neurologic: She is chronically blind and is slightly hard of hearing, otherwise cranial nerves II through XII are intact. Strength in the supine  position is grossly 5 over 5 upper and lower extremities. Sensation is grossly intact. Gait not assessed. She is alert and oriented to herself, city, and hospital.           Labs on Admission:  Basic Metabolic Panel:  Recent Labs Lab 11/02/13 1132  NA 143  K 4.6  CL 103  CO2 28  GLUCOSE 84  BUN 15  CREATININE 0.78  CALCIUM 9.4   Liver Function Tests:  Recent Labs Lab 11/02/13 1132  AST 36  ALT 46*  ALKPHOS 73  BILITOT <0.2*  PROT 6.6  ALBUMIN 3.6   No results found for this basename: LIPASE, AMYLASE,  in the last 168 hours No results found for this basename: AMMONIA,  in the last 168 hours CBC:  Recent Labs Lab 11/02/13  1132  WBC 7.3  NEUTROABS 5.6  HGB 12.8  HCT 38.5  MCV 92.8  PLT 267   Cardiac Enzymes:  Recent Labs Lab 11/02/13 1132  TROPONINI <0.30    BNP (last 3 results)  Recent Labs  11/02/13 1132  PROBNP 151.7*   CBG:  Recent Labs Lab 11/02/13 0919  GLUCAP 81    Radiological Exams on Admission: Dg Hip Bilateral W/pelvis  11/02/2013   CLINICAL DATA:  Status post fall  EXAM: BILATERAL HIP WITH PELVIS - 4+ VIEW  COMPARISON:  Lumbar spine of August 20, 2012  FINDINGS: The bony pelvis is intact. There are degenerative changes of the lumbar spine. AP and lateral views of the hips. No fracture or dislocation or significant degenerative change.  IMPRESSION: There is no acute bony abnormality of the hips or bony pelvis.   Electronically Signed   By: David  Swazil[place]   On: 11/02/2013 11:58   Mr Brain Wo Contrast  11/02/2013   CLINICAL DATA:  63 year old female with fall with head injury. Diffuse pain and headache. Initial encounter. History of breast cancer.  EXAM: MRI HEAD WITHOUT CONTRAST  TECHNIQUE: Multiplanar, multiecho pulse sequences of the brain and surrounding structures were obtained without intravenous contrast.  COMPARISON:  Head CTs 08/11/2013 and earlier.  FINDINGS: Cerebral volume is within normal limits for age. Major intracranial  vascular flow voids are preserved. Dominant distal right vertebral artery suspected.  There is an oval T2 hyperintense cystic appearing lesion of the sella turcica. 7 x 12 x 9 mm (AP by transverse by CC). 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 No suprasellar extension or suprasellar mass effect. There is evidence of this on prior studies back to 08/20/2012, on the basis of low CT density within the right sella.  No restricted diffusion to suggest acute infarction. No midline shift, mass effect, evidence of mass lesion, ventriculomegaly, extra-axial collection or acute intracranial hemorrhage. Cervicomedullary junction within normal limits. Cervical spine findings are reported separately.  Focal encephalomalacia in the medial right thalamus is new from prior studies and most resembles a chronic lacunar infarct. No cortical encephalomalacia. Other deep gray matter nuclei are within normal limits for age. Brainstem and cerebellum are within normal limits. Visible internal auditory structures appear normal. Visualized bone marrow signal is within normal limits. Mildly heterogeneous bone marrow signal, including at the dorsal clivus, which had normal bone mineralization recently. Mastoids are clear. Negative paranasal sinuses. Postoperative changes to the left globe. Negative right orbit soft tissues. Visualized scalp soft tissues are within normal limits.  IMPRESSION: 1.  No acute intracranial abnormality. 2. Chronic lacunar infarct medial right thalamus appears to be new since 08/11/2013. 3. 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 If clinically desired, dedicated pituitary protocol MRI could evaluate further.   Electronically Signed   By: Augusto GambleLee  Hall M.D.   On: 11/02/2013 13:23   Mr Cervical Spine Wo Contrast  11/02/2013   CLINICAL DATA:  Neck pain and headache after a fall. History of  breast cancer.  EXAM: MRI CERVICAL SPINE WITHOUT CONTRAST  TECHNIQUE: Multiplanar, multisequence MR imaging of the cervical spine was performed. No intravenous contrast was administered.  COMPARISON:  08/20/2012 CT  FINDINGS: The study suffers from considerable motion degradation. Water sensitive imaging does not show evidence of rib fracture or soft tissue edema.  The foramen magnum is widely patent. C1-2 and C2-3 are  unremarkable.  C3-4: Spondylosis with endplate osteophytes and bulging of the disc. Facet degeneration on the left. The central canal is sufficiently patent. Mild foraminal narrowing on the right and moderate foraminal narrowing on the left.  C4-5: Bilateral facet arthropathy with anterolisthesis of 2 mm. This is worse on the right. Bulging of the disc. Narrowing of the subarachnoid space but no compression of the cord. Foraminal narrowing on the right.  C5-6: Spondylosis with endplate osteophytes and bulging of the disc. Narrowing of the ventral subarachnoid space but no cord compression. Foraminal narrowing bilaterally.  C6-7: Spondylosis with endplate osteophytes and protruding disc material more prominent on the right. No compressive central canal stenosis. Foraminal narrowing on the right that could affect the C7 nerve root.  C7-T1:  Mild bulging of the disc.  No canal or foraminal stenosis.  IMPRESSION: No acute or traumatic finding evident. The examination does suffer from considerable motion degradation.  No compressive stenosis of the central canal. Facet arthropathy most pronounced at C4-5 with anterolisthesis of 2 mm.  Foraminal narrowing that could possibly cause neural compression. Most notable levels include C3-4 on the left, C4-5 on the right, C5-6 bilaterally and C6-7 on the right.   Electronically Signed   By: Paulina Fusi M.D.   On: 11/02/2013 13:17    EKG: Independently reviewed. As above in history of present illness.  Assessment/Plan Principal Problem:   Syncope and  collapse Active Problems:   Chronic diastolic heart failure   Memory disorder   Blind   Hypothyroidism   History of CVA (cerebrovascular accident)   Bradycardia   Tobacco abuse   1. This is a 63 year old woman who presents with syncope and collapse. Etiology is unclear, but consider arrhythmia and/or orthostasis. MRI of the brain revealed no acute intracranial abnormalities, but there was noted a cystic lesion of the sella and a chronic lacunar infarct. MRI of the cervical spine revealed no acute fracture. She does not appear to have any infectious etiology. Other than her chronic blindness and decrease in hearing acuity, there does not appear to be any acute neurological abnormality noted. Her lab data are virtually unremarkable. She appears to be a little dry, so therefore, Lasix will be withheld until in the morning and she will be hydrated overnight. She may be orthostatic.  Plan: 1. Will hydrate overnight. We'll hold Lasix until tomorrow morning. 2. For further evaluation, we'll order TSH, cardiac enzymes, 2-D echocardiogram, CXR, and carotid ultrasound. 3. We'll also order orthostatic vital signs now and then tomorrow morning. 4. The patient was strongly advised to stop smoking. 5. We'll order PT evaluation tomorrow. 6. We'll add an aspirin 81 mg daily given the finding of chronic lacunar infarct.  Code Status: Full code Family Communication: None available Disposition Plan: Discharge back to [facility] in the next 24-48 hours, depending on the clinical course and results of the diagnostic studies.  Time spent: One hour  [facility] Pager [phone]  **Disclaimer: This note may have been dictated with voice recognition software. Similar sounding words can inadvertently be transcribed and this note may contain transcription errors which may not have been corrected upon publication of note.**

## 2013-11-02 NOTE — ED Notes (Addendum)
Pt from [facility]. Per facility staff pt had witnessed fall this am and hit her head. Pt reprots LOC. Pt c/o headache at this time. Pt denies any dizziness at this time. Pt alert and oriented. Airway patent. No neuro deficits noted in triage.

## 2013-11-02 NOTE — ED Provider Notes (Signed)
CSN: [id number]     Arrival date & time 11/02/13  3976 History  This chart was scribed for Vanetta Mulders, MD by Modena Jansky, ED Scribe. This patient was seen in room APA08/APA08 and the patient's care was started at 10:50 AM.   Chief Complaint  Patient presents with  . Fall    Patient is a 63 y.o. female presenting with fall. The history is provided by the nursing home. The history is limited by the condition of the patient. No language interpreter was used.  Fall This is a new problem. The current episode started 3 to 5 hours ago. The problem has not changed since onset.Associated symptoms comments: LOC.   Level 5 Caveat  HPI Comments: Kimberly [name] is a 63 y.o. female who presents to the Emergency Department complaining of a fall that occurred at [facility] nursing home this morning. She was brought in by A family member. Nursing home states that she slipped and fell backwards. They also report LOC in pt. HPI limited by the condition of the pt.   Past Medical History  Diagnosis Date  . COPD (chronic obstructive pulmonary disease)   . CHF (congestive heart failure)   . Anemia   . Arthritis     osteoarthritis  . Hypothyroidism   . HOH (hard of hearing)   . Pneumonia   . Shortness of breath   . Depression   . DDD (degenerative disc disease)   . Sepsis   . Memory disorder 08/02/2013  . Abnormality of gait 08/02/2013  . Blind    Past Surgical History  Procedure Laterality Date  . Cesarean section    . Arm debridement      as child  . Strabismus surgery      age 78  . Cataract extraction w/phaco  04/28/2011    Procedure: CATARACT EXTRACTION PHACO AND INTRAOCULAR LENS PLACEMENT (IOC);  Surgeon: Susa Simmonds;  Location: [facility];  Service: Ophthalmology;  Laterality: Left;  CDE: 5.37  . Colonoscopy  06/05/2009    RMR: nl rectum, left-sided diverticula, colonoscopy performed due to question of colovesicular fistula    Family History  Problem Relation Age of Onset  . Colon  cancer      neg hx?   . Diabetes Brother   . Diabetes Brother   . Diabetes Mother   . Cancer - Lung Father    History  Substance Use Topics  . Smoking status: Current Every Day Smoker -- 0.50 packs/day    Types: Cigarettes  . Smokeless tobacco: Never Used  . Alcohol Use: No   OB History   Grav Para Term Preterm Abortions TAB SAB Ect Mult Living   2 2 2       2      Review of Systems  Unable to perform ROS: Dementia  Neurological: Positive for syncope.     Allergies  Review of patient's allergies indicates no known allergies.  Home Medications   Prior to Admission medications   Medication Sig Start Date End Date Taking? Authorizing Provider  acetaminophen (TYLENOL) 325 MG tablet Take 650 mg by mouth 3 (three) times daily.     Yes Historical Provider, MD  cholecalciferol (VITAMIN D) 400 UNITS TABS Take 800 Units by mouth daily.     Yes Historical Provider, MD  donepezil (ARICEPT) 5 MG tablet TAKE 1 TABLET BY MOUTH AT BEDTIME. 08/17/13  Yes York Spaniel, MD  levothyroxine (SYNTHROID, LEVOTHROID) 25 MCG tablet Take 25 mcg by mouth daily.  Yes Historical Provider, MD  LORazepam (ATIVAN) 0.5 MG tablet Take 0.5 mg by mouth 2 (two) times daily.    Yes Historical Provider, MD  memantine (NAMENDA) 10 MG tablet Take 10 mg by mouth 2 (two) times daily.   Yes Historical Provider, MD  mirtazapine (REMERON) 15 MG tablet Take 7.5 mg by mouth at bedtime.   Yes Historical Provider, MD  Multiple Vitamins-Minerals (MULTIVITAMINS THER. W/MINERALS) TABS Take 1 tablet by mouth daily.     Yes Historical Provider, MD  sertraline (ZOLOFT) 50 MG tablet Take 50 mg by mouth at bedtime.   Yes Historical Provider, MD  simvastatin (ZOCOR) 20 MG tablet Take 20 mg by mouth every evening.   Yes Historical Provider, MD  furosemide (LASIX) 20 MG tablet Take 20 mg by mouth daily.      Historical Provider, MD  ondansetron (ZOFRAN) 4 MG tablet Take 4 mg by mouth every 6 (six) hours as needed for nausea.   11/04/11   Historical Provider, MD  polyethylene glycol powder (GLYCOLAX/MIRALAX) powder Take 17 g by mouth daily as needed. constipation    Historical Provider, MD   BP 99/78  Pulse 73  Temp(Src) 97.2 F (36.2 C) (Oral)  Resp 16  Ht 5\' 1"  (1.549 m)  Wt 100 lb (45.36 kg)  BMI 18.90 kg/m2  SpO2 98% Physical Exam  Nursing note and vitals reviewed. Constitutional: She is oriented to person, place, and time. She appears well-developed and well-nourished. No distress.  HENT:  Head: Normocephalic and atraumatic.  Eyes: EOM are normal.  Eyes open and pupils are good, not able to check eyes.  Neck: Neck supple. No tracheal deviation present.  Cardiovascular: Normal rate.   Pulmonary/Chest: Effort normal. No respiratory distress.  Musculoskeletal: Normal range of motion.  Neurological: She is alert and oriented to person, place, and time. No cranial nerve deficit. Coordination normal.  Skin: Skin is warm and dry.  Psychiatric: She has a normal mood and affect. Her behavior is normal.    ED Course  Procedures (including critical care time) DIAGNOSTIC STUDIES: Oxygen Saturation is 98% on RA, normal by my interpretation.    COORDINATION OF CARE: 10:55 AM- Pt advised of plan for treatment and pt agrees.  Results for orders placed during the hospital encounter of 11/02/13  CBC WITH DIFFERENTIAL      Result Value Ref Range   WBC 7.3  4.0 - 10.5 K/uL   RBC 4.15  3.87 - 5.11 MIL/uL   Hemoglobin 12.8  12.0 - 15.0 g/dL   HCT 16.1  09.6 - 04.5 %   MCV 92.8  78.0 - 100.0 fL   MCH 30.8  26.0 - 34.0 pg   MCHC 33.2  30.0 - 36.0 g/dL   RDW 40.9  81.1 - 91.4 %   Platelets 267  150 - 400 K/uL   Neutrophils Relative % 78 (*) 43 - 77 %   Neutro Abs 5.6  1.7 - 7.7 K/uL   Lymphocytes Relative 16  12 - 46 %   Lymphs Abs 1.2  0.7 - 4.0 K/uL   Monocytes Relative 5  3 - 12 %   Monocytes Absolute 0.4  0.1 - 1.0 K/uL   Eosinophils Relative 1  0 - 5 %   Eosinophils Absolute 0.1  0.0 - 0.7 K/uL    Basophils Relative 0  0 - 1 %   Basophils Absolute 0.0  0.0 - 0.1 K/uL  COMPREHENSIVE METABOLIC PANEL      Result Value Ref  Range   Sodium 143  137 - 147 mEq/L   Potassium 4.6  3.7 - 5.3 mEq/L   Chloride 103  96 - 112 mEq/L   CO2 28  19 - 32 mEq/L   Glucose, Bld 84  70 - 99 mg/dL   BUN 15  6 - 23 mg/dL   Creatinine, Ser 1.61  0.50 - 1.10 mg/dL   Calcium 9.4  8.4 - 09.6 mg/dL   Total Protein 6.6  6.0 - 8.3 g/dL   Albumin 3.6  3.5 - 5.2 g/dL   AST 36  0 - 37 U/L   ALT 46 (*) 0 - 35 U/L   Alkaline Phosphatase 73  39 - 117 U/L   Total Bilirubin <0.2 (*) 0.3 - 1.2 mg/dL   GFR calc non Af Amer 87 (*) >90 mL/min   GFR calc Af Amer >90  >90 mL/min  URINALYSIS, ROUTINE W REFLEX MICROSCOPIC      Result Value Ref Range   Color, Urine YELLOW  YELLOW   APPearance CLEAR  CLEAR   Specific Gravity, Urine <1.005 (*) 1.005 - 1.030   pH 6.0  5.0 - 8.0   Glucose, UA NEGATIVE  NEGATIVE mg/dL   Hgb urine dipstick NEGATIVE  NEGATIVE   Bilirubin Urine NEGATIVE  NEGATIVE   Ketones, ur NEGATIVE  NEGATIVE mg/dL   Protein, ur NEGATIVE  NEGATIVE mg/dL   Urobilinogen, UA 0.2  0.0 - 1.0 mg/dL   Nitrite NEGATIVE  NEGATIVE   Leukocytes, UA NEGATIVE  NEGATIVE  TROPONIN I      Result Value Ref Range   Troponin I <0.30  <0.30 ng/mL  PRO B NATRIURETIC PEPTIDE      Result Value Ref Range   Pro B Natriuretic peptide (BNP) 151.7 (*) 0 - 125 pg/mL  CBG MONITORING, ED      Result Value Ref Range   Glucose-Capillary 81  70 - 99 mg/dL   Results for orders placed during the hospital encounter of 11/02/13  CBC WITH DIFFERENTIAL      Result Value Ref Range   WBC 7.3  4.0 - 10.5 K/uL   RBC 4.15  3.87 - 5.11 MIL/uL   Hemoglobin 12.8  12.0 - 15.0 g/dL   HCT 04.5  40.9 - 81.1 %   MCV 92.8  78.0 - 100.0 fL   MCH 30.8  26.0 - 34.0 pg   MCHC 33.2  30.0 - 36.0 g/dL   RDW 91.4  78.2 - 95.6 %   Platelets 267  150 - 400 K/uL   Neutrophils Relative % 78 (*) 43 - 77 %   Neutro Abs 5.6  1.7 - 7.7 K/uL   Lymphocytes  Relative 16  12 - 46 %   Lymphs Abs 1.2  0.7 - 4.0 K/uL   Monocytes Relative 5  3 - 12 %   Monocytes Absolute 0.4  0.1 - 1.0 K/uL   Eosinophils Relative 1  0 - 5 %   Eosinophils Absolute 0.1  0.0 - 0.7 K/uL   Basophils Relative 0  0 - 1 %   Basophils Absolute 0.0  0.0 - 0.1 K/uL  COMPREHENSIVE METABOLIC PANEL      Result Value Ref Range   Sodium 143  137 - 147 mEq/L   Potassium 4.6  3.7 - 5.3 mEq/L   Chloride 103  96 - 112 mEq/L   CO2 28  19 - 32 mEq/L   Glucose, Bld 84  70 - 99 mg/dL   BUN  15  6 - 23 mg/dL   Creatinine, Ser 1.61  0.50 - 1.10 mg/dL   Calcium 9.4  8.4 - 09.6 mg/dL   Total Protein 6.6  6.0 - 8.3 g/dL   Albumin 3.6  3.5 - 5.2 g/dL   AST 36  0 - 37 U/L   ALT 46 (*) 0 - 35 U/L   Alkaline Phosphatase 73  39 - 117 U/L   Total Bilirubin <0.2 (*) 0.3 - 1.2 mg/dL   GFR calc non Af Amer 87 (*) >90 mL/min   GFR calc Af Amer >90  >90 mL/min  URINALYSIS, ROUTINE W REFLEX MICROSCOPIC      Result Value Ref Range   Color, Urine YELLOW  YELLOW   APPearance CLEAR  CLEAR   Specific Gravity, Urine <1.005 (*) 1.005 - 1.030   pH 6.0  5.0 - 8.0   Glucose, UA NEGATIVE  NEGATIVE mg/dL   Hgb urine dipstick NEGATIVE  NEGATIVE   Bilirubin Urine NEGATIVE  NEGATIVE   Ketones, ur NEGATIVE  NEGATIVE mg/dL   Protein, ur NEGATIVE  NEGATIVE mg/dL   Urobilinogen, UA 0.2  0.0 - 1.0 mg/dL   Nitrite NEGATIVE  NEGATIVE   Leukocytes, UA NEGATIVE  NEGATIVE  TROPONIN I      Result Value Ref Range   Troponin I <0.30  <0.30 ng/mL  PRO B NATRIURETIC PEPTIDE      Result Value Ref Range   Pro B Natriuretic peptide (BNP) 151.7 (*) 0 - 125 pg/mL  CBG MONITORING, ED      Result Value Ref Range   Glucose-Capillary 81  70 - 99 mg/dL    No results found.  Medications  0.9 %  sodium chloride infusion (not administered)     Labs Review Labs Reviewed  CBC WITH DIFFERENTIAL - Abnormal; Notable for the following:    Neutrophils Relative % 78 (*)    All other components within normal limits   COMPREHENSIVE METABOLIC PANEL - Abnormal; Notable for the following:    ALT 46 (*)    Total Bilirubin <0.2 (*)    GFR calc non Af Amer 87 (*)    All other components within normal limits  URINALYSIS, ROUTINE W REFLEX MICROSCOPIC - Abnormal; Notable for the following:    Specific Gravity, Urine <1.005 (*)    All other components within normal limits  PRO B NATRIURETIC PEPTIDE - Abnormal; Notable for the following:    Pro B Natriuretic peptide (BNP) 151.7 (*)    All other components within normal limits  TROPONIN I  CBG MONITORING, ED   Results for orders placed during the hospital encounter of 11/02/13  CBC WITH DIFFERENTIAL      Result Value Ref Range   WBC 7.3  4.0 - 10.5 K/uL   RBC 4.15  3.87 - 5.11 MIL/uL   Hemoglobin 12.8  12.0 - 15.0 g/dL   HCT 04.5  40.9 - 81.1 %   MCV 92.8  78.0 - 100.0 fL   MCH 30.8  26.0 - 34.0 pg   MCHC 33.2  30.0 - 36.0 g/dL   RDW 91.4  78.2 - 95.6 %   Platelets 267  150 - 400 K/uL   Neutrophils Relative % 78 (*) 43 - 77 %   Neutro Abs 5.6  1.7 - 7.7 K/uL   Lymphocytes Relative 16  12 - 46 %   Lymphs Abs 1.2  0.7 - 4.0 K/uL   Monocytes Relative 5  3 - 12 %  Monocytes Absolute 0.4  0.1 - 1.0 K/uL   Eosinophils Relative 1  0 - 5 %   Eosinophils Absolute 0.1  0.0 - 0.7 K/uL   Basophils Relative 0  0 - 1 %   Basophils Absolute 0.0  0.0 - 0.1 K/uL  COMPREHENSIVE METABOLIC PANEL      Result Value Ref Range   Sodium 143  137 - 147 mEq/L   Potassium 4.6  3.7 - 5.3 mEq/L   Chloride 103  96 - 112 mEq/L   CO2 28  19 - 32 mEq/L   Glucose, Bld 84  70 - 99 mg/dL   BUN 15  6 - 23 mg/dL   Creatinine, Ser 8.11  0.50 - 1.10 mg/dL   Calcium 9.4  8.4 - 91.4 mg/dL   Total Protein 6.6  6.0 - 8.3 g/dL   Albumin 3.6  3.5 - 5.2 g/dL   AST 36  0 - 37 U/L   ALT 46 (*) 0 - 35 U/L   Alkaline Phosphatase 73  39 - 117 U/L   Total Bilirubin <0.2 (*) 0.3 - 1.2 mg/dL   GFR calc non Af Amer 87 (*) >90 mL/min   GFR calc Af Amer >90  >90 mL/min  URINALYSIS, ROUTINE W  REFLEX MICROSCOPIC      Result Value Ref Range   Color, Urine YELLOW  YELLOW   APPearance CLEAR  CLEAR   Specific Gravity, Urine <1.005 (*) 1.005 - 1.030   pH 6.0  5.0 - 8.0   Glucose, UA NEGATIVE  NEGATIVE mg/dL   Hgb urine dipstick NEGATIVE  NEGATIVE   Bilirubin Urine NEGATIVE  NEGATIVE   Ketones, ur NEGATIVE  NEGATIVE mg/dL   Protein, ur NEGATIVE  NEGATIVE mg/dL   Urobilinogen, UA 0.2  0.0 - 1.0 mg/dL   Nitrite NEGATIVE  NEGATIVE   Leukocytes, UA NEGATIVE  NEGATIVE  TROPONIN I      Result Value Ref Range   Troponin I <0.30  <0.30 ng/mL  PRO B NATRIURETIC PEPTIDE      Result Value Ref Range   Pro B Natriuretic peptide (BNP) 151.7 (*) 0 - 125 pg/mL  CBG MONITORING, ED      Result Value Ref Range   Glucose-Capillary 81  70 - 99 mg/dL   Dg Hip Bilateral W/pelvis  11/02/2013   CLINICAL DATA:  Status post fall  EXAM: BILATERAL HIP WITH PELVIS - 4+ VIEW  COMPARISON:  Lumbar spine of August 20, 2012  FINDINGS: The bony pelvis is intact. There are degenerative changes of the lumbar spine. AP and lateral views of the hips. No fracture or dislocation or significant degenerative change.  IMPRESSION: There is no acute bony abnormality of the hips or bony pelvis.   Electronically Signed   By: David  [place]   On: 11/02/2013 11:58   Mr Brain Wo Contrast  11/02/2013   CLINICAL DATA:  63 year old female with fall with head injury. Diffuse pain and headache. Initial encounter. History of breast cancer.  EXAM: MRI HEAD WITHOUT CONTRAST  TECHNIQUE: Multiplanar, multiecho pulse sequences of the brain and surrounding structures were obtained without intravenous contrast.  COMPARISON:  Head CTs 08/11/2013 and earlier.  FINDINGS: Cerebral volume is within normal limits for age. Major intracranial vascular flow voids are preserved. Dominant distal right vertebral artery suspected.  There is an oval T2 hyperintense cystic appearing lesion of the sella turcica. 7 x 12 x 9 mm (AP by transverse by CC). This appears  to displace the pituitary  towards the left. No suprasellar extension or suprasellar mass effect. There is evidence of this on prior studies back to 08/20/2012, on the basis of low CT density within the right sella.  No restricted diffusion to suggest acute infarction. No midline shift, mass effect, evidence of mass lesion, ventriculomegaly, extra-axial collection or acute intracranial hemorrhage. Cervicomedullary junction within normal limits. Cervical spine findings are reported separately.  Focal encephalomalacia in the medial right thalamus is new from prior studies and most resembles a chronic lacunar infarct. No cortical encephalomalacia. Other deep gray matter nuclei are within normal limits for age. Brainstem and cerebellum are within normal limits. Visible internal auditory structures appear normal. Visualized bone marrow signal is within normal limits. Mildly heterogeneous bone marrow signal, including at the dorsal clivus, which had normal bone mineralization recently. Mastoids are clear. Negative paranasal sinuses. Postoperative changes to the left globe. Negative right orbit soft tissues. Visualized scalp soft tissues are within normal limits.  IMPRESSION: 1.  No acute intracranial abnormality. 2. Chronic lacunar infarct medial right thalamus appears to be new since 08/11/2013. 3. 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 If clinically desired, dedicated pituitary protocol MRI could evaluate further.   Electronically Signed   By: Augusto Gamble M.D.   On: 11/02/2013 13:23   Mr Cervical Spine Wo Contrast  11/02/2013   CLINICAL DATA:  Neck pain and headache after a fall. History of breast cancer.  EXAM: MRI CERVICAL SPINE WITHOUT CONTRAST  TECHNIQUE: Multiplanar, multisequence MR imaging of the cervical spine was performed. No intravenous contrast was administered.  COMPARISON:  08/20/2012 CT   FINDINGS: The study suffers from considerable motion degradation. Water sensitive imaging does not show evidence of rib fracture or soft tissue edema.  The foramen magnum is widely patent. C1-2 and C2-3 are unremarkable.  C3-4: Spondylosis with endplate osteophytes and bulging of the disc. Facet degeneration on the left. The central canal is sufficiently patent. Mild foraminal narrowing on the right and moderate foraminal narrowing on the left.  C4-5: Bilateral facet arthropathy with anterolisthesis of 2 mm. This is worse on the right. Bulging of the disc. Narrowing of the subarachnoid space but no compression of the cord. Foraminal narrowing on the right.  C5-6: Spondylosis with endplate osteophytes and bulging of the disc. Narrowing of the ventral subarachnoid space but no cord compression. Foraminal narrowing bilaterally.  C6-7: Spondylosis with endplate osteophytes and protruding disc material more prominent on the right. No compressive central canal stenosis. Foraminal narrowing on the right that could affect the C7 nerve root.  C7-T1:  Mild bulging of the disc.  No canal or foraminal stenosis.  IMPRESSION: No acute or traumatic finding evident. The examination does suffer from considerable motion degradation.  No compressive stenosis of the central canal. Facet arthropathy most pronounced at C4-5 with anterolisthesis of 2 mm.  Foraminal narrowing that could possibly cause neural compression. Most notable levels include C3-4 on the left, C4-5 on the right, C5-6 bilaterally and C6-7 on the right.   Electronically Signed   By: Paulina Fusi M.D.   On: 11/02/2013 13:17     Imaging Review Dg Hip Bilateral W/pelvis  11/02/2013   CLINICAL DATA:  Status post fall  EXAM: BILATERAL HIP WITH PELVIS - 4+ VIEW  COMPARISON:  Lumbar spine of August 20, 2012  FINDINGS: The bony pelvis is intact. There are degenerative changes of the lumbar spine.  AP and lateral views of the hips. No fracture or dislocation or significant  degenerative change.  IMPRESSION: There is no acute bony abnormality of the hips or bony pelvis.   Electronically Signed   By: David  [place]   On: 11/02/2013 11:58   Mr Brain Wo Contrast  11/02/2013   CLINICAL DATA:  63 year old female with fall with head injury. Diffuse pain and headache. Initial encounter. History of breast cancer.  EXAM: MRI HEAD WITHOUT CONTRAST  TECHNIQUE: Multiplanar, multiecho pulse sequences of the brain and surrounding structures were obtained without intravenous contrast.  COMPARISON:  Head CTs 08/11/2013 and earlier.  FINDINGS: Cerebral volume is within normal limits for age. Major intracranial vascular flow voids are preserved. Dominant distal right vertebral artery suspected.  There is an oval T2 hyperintense cystic appearing lesion of the sella turcica. 7 x 12 x 9 mm (AP by transverse by CC). 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 No suprasellar extension or suprasellar mass effect. There is evidence of this on prior studies back to 08/20/2012, on the basis of low CT density within the right sella.  No restricted diffusion to suggest acute infarction. No midline shift, mass effect, evidence of mass lesion, ventriculomegaly, extra-axial collection or acute intracranial hemorrhage. Cervicomedullary junction within normal limits. Cervical spine findings are reported separately.  Focal encephalomalacia in the medial right thalamus is new from prior studies and most resembles a chronic lacunar infarct. No cortical encephalomalacia. Other deep gray matter nuclei are within normal limits for age. Brainstem and cerebellum are within normal limits. Visible internal auditory structures appear normal. Visualized bone marrow signal is within normal limits. Mildly heterogeneous bone marrow signal, including at the dorsal clivus, which had normal bone mineralization recently. Mastoids are clear. Negative paranasal sinuses. Postoperative changes to the left globe. Negative right  orbit soft tissues. Visualized scalp soft tissues are within normal limits.  IMPRESSION: 1.  No acute intracranial abnormality. 2. Chronic lacunar infarct medial right thalamus appears to be new since 08/11/2013. 3. 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 If clinically desired, dedicated pituitary protocol MRI could evaluate further.   Electronically Signed   By: Augusto Gamble M.D.   On: 11/02/2013 13:23   Mr Cervical Spine Wo Contrast  11/02/2013   CLINICAL DATA:  Neck pain and headache after a fall. History of breast cancer.  EXAM: MRI CERVICAL SPINE WITHOUT CONTRAST  TECHNIQUE: Multiplanar, multisequence MR imaging of the cervical spine was performed. No intravenous contrast was administered.  COMPARISON:  08/20/2012 CT  FINDINGS: The study suffers from considerable motion degradation. Water sensitive imaging does not show evidence of rib fracture or soft tissue edema.  The foramen magnum is widely patent. C1-2 and C2-3 are unremarkable.  C3-4: Spondylosis with endplate osteophytes and bulging of the disc. Facet degeneration on the left. The central canal is sufficiently patent. Mild foraminal narrowing on the right and moderate foraminal narrowing on the left.  C4-5: Bilateral facet arthropathy with anterolisthesis of 2 mm. This is worse on the right. Bulging of the disc. Narrowing of the subarachnoid space but no compression of the cord. Foraminal narrowing on the right.  C5-6: Spondylosis with endplate osteophytes and bulging of the disc. Narrowing of the ventral subarachnoid space but no cord compression. Foraminal narrowing bilaterally.  C6-7: Spondylosis with endplate osteophytes and protruding disc material more prominent on the right. No compressive central  canal stenosis. Foraminal narrowing on the right that could affect the C7 nerve root.  C7-T1:  Mild bulging of the disc.  No canal or  foraminal stenosis.  IMPRESSION: No acute or traumatic finding evident. The examination does suffer from considerable motion degradation.  No compressive stenosis of the central canal. Facet arthropathy most pronounced at C4-5 with anterolisthesis of 2 mm.  Foraminal narrowing that could possibly cause neural compression. Most notable levels include C3-4 on the left, C4-5 on the right, C5-6 bilaterally and C6-7 on the right.   Electronically Signed   By: Paulina FusiMark  Shogry M.D.   On: 11/02/2013 13:17     EKG Interpretation   Date/Time:  Wednesday November 02 2013 09:06:36 EDT Ventricular Rate:  56 PR Interval:  119 QRS Duration: 64 QT Interval:  443 QTC Calculation: 427 R Axis:   49 Text Interpretation:  Sinus rhythm Borderline short PR interval Borderline  low voltage, extremity leads No significant change since last tracing  Confirmed by Avamae Dehaan  MD, Clarnce Homan ([id number]) on 11/02/2013 10:09:05 AM      MDM   Final diagnoses:  Syncope  Head injury     Patient from a nursing home 5 caveat applies due to memory deficit. Patient is also blind and hard of hearing. Patient had a witnessed fall with syncope before hand fell backwards struck her head. Workup for the fall was negative. Workup for the syncope without any significant findings however cardiac monitoring will be required overnight. Patient had an MRI of her brain and neck due to CT scanner not functioning. Patient showed no acute findings there. Patient will be admitted to the hospitalist team temporary middle orders completed.   I personally performed the services described in this documentation, which was scribed in my presence. The recorded information has been reviewed and is accurate.  You    Vanetta MuldersScott Ernestine Rohman, MD 11/02/13 [phone]

## 2013-11-02 NOTE — ED Notes (Signed)
Per Selena Batten at [facility].  Pt was walking down a hallway and looked like she passed out.  She fell down on a concrete floor and hit her head.  B/p 70/58 after fall.  She regained loc after falling and stated that her head was hurting.

## 2013-11-03 DIAGNOSIS — I369 Nonrheumatic tricuspid valve disorder, unspecified: Secondary | ICD-10-CM

## 2013-11-03 LAB — COMPREHENSIVE METABOLIC PANEL
ALT: 41 U/L — ABNORMAL HIGH (ref 0–35)
AST: 31 U/L (ref 0–37)
Albumin: 3.9 g/dL (ref 3.5–5.2)
Alkaline Phosphatase: 84 U/L (ref 39–117)
BUN: 13 mg/dL (ref 6–23)
CO2: 25 meq/L (ref 19–32)
Calcium: 9.5 mg/dL (ref 8.4–10.5)
Chloride: 107 mEq/L (ref 96–112)
Creatinine, Ser: 0.69 mg/dL (ref 0.50–1.10)
GFR calc non Af Amer: >90 mL/min (ref >90)
GLUCOSE: 72 mg/dL (ref 70–99)
Potassium: 4.7 mEq/L (ref 3.7–5.3)
SODIUM: 144 meq/L (ref 137–147)
Total Bilirubin: 0.4 mg/dL (ref 0.3–1.2)
Total Protein: 7.3 g/dL (ref 6.0–8.3)

## 2013-11-03 LAB — CBC
HEMATOCRIT: 41.8 % (ref 36.0–46.0)
Hemoglobin: 13.7 g/dL (ref 12.0–15.0)
MCH: 30.9 pg (ref 26.0–34.0)
MCHC: 32.8 g/dL (ref 30.0–36.0)
MCV: 94.1 fL (ref 78.0–100.0)
Platelets: 269 10*3/uL (ref 150–400)
RBC: 4.44 MIL/uL (ref 3.87–5.11)
RDW: 13.5 % (ref 11.5–15.5)
WBC: 6.9 10*3/uL (ref 4.0–10.5)

## 2013-11-03 LAB — TROPONIN I: Troponin I: <0.3 ng/mL (ref <0.30)

## 2013-11-03 LAB — TSH: TSH: 0.675 u[IU]/mL (ref 0.350–4.500)

## 2013-11-03 NOTE — Care Management Utilization Note (Signed)
UR completed 

## 2013-11-03 NOTE — Clinical Social Work Psychosocial (Signed)
Clinical Social Work Department BRIEF PSYCHOSOCIAL ASSESSMENT 11/03/2013  Patient:  Kimberly [name], Kimberly [name]     Account Number:  [id number]     Admit date:  11/02/2013  Clinical Social Worker:  Edwyna Shell, CLINICAL SOCIAL WORKER  Date/Time:  11/03/2013 09:00 AM  Referred by:  Physician  Date Referred:  11/03/2013 Referred for  ALF Placement   Other Referral:   Interview type:  Patient Other interview type:   Also spoke w Aram Beecham B at [place] Status:  [place] Admitted from facility:  [facility] Level of care:  Assisted Living Primary support name:  Rosey Bath Primary support relationship to patient:  Meta Degree of support available:   Per ALF, 2 daughters have never visited while patient has been at [place] contacts Rosey Bath (brother) by phone  if needed.  Patient is own guardian at present.    CURRENT CONCERNS Current Concerns  Post-Acute Placement   Other Concerns:    SOCIAL WORK ASSESSMENT / PLAN CSW met w patient at bedside, patient oriented to person, place, situation.  Not oriented to time.  Gave very little information to CSW, says she has two daughters who live in "[place]" and asked that CSW contact them.  Confirmed that she lives at [facility], cannot remember how long she has lived there.  Says "it's OK."    CSW contacted Aram Beecham, [facility] administrator. Patient has been resident there for "many years."  Was at [facility] for brief period of time but  has lived at [facility] for past 2 consecutive years.  Does not have [facility], but is grandfathered in  due to length of time she has been at facility.  Patient is "like one of our family" and [facility] will take back at discharge.  Facility informed discharge may happen in next 24-48 hours.    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  She is out of her element is she doesnt have her  glasses."  She  requires limited assist for most ADLs (bathing, toileting, dressing).  Requires facility to assist w eating - "she thinks she cannot see her food" - says patient will not eat unless staff put food on spoon and guide to her mouth.  Setting tray in front of patient is not sufficient.  Patient is seen by [facility] once a month for oral medications to control symptoms of paranoia and delusions.  Says patient listens to voices.    Facility prefers [facility] if [facility] is needed.  Facility can transport patient back to facility when ready.   Assessment/plan status:  Psychosocial Support/Ongoing Assessment of Needs Other assessment/ plan:   Information/referral to community resources:   [facility]    PATIENT'S/FAMILY'S RESPONSE TO PLAN OF CARE: Patient says [facility] is "OK."  Willing to return at discharge, expressed no concerns.        Edwyna Shell, LCSW Clinical Social Worker [phone])

## 2013-11-03 NOTE — Progress Notes (Signed)
  Echocardiogram 2D Echocardiogram has been performed.  Gracie Gupta 11/03/2013, 10:01 AM

## 2013-11-03 NOTE — Progress Notes (Signed)
Update given to [facility].  Will continue to monitor.   Reita Cliche 11/03/2013 3:45 PM

## 2013-11-03 NOTE — Progress Notes (Signed)
Note: This document was prepared with digital dictation and possible smart phrase technology. Any transcriptional errors that result from this process are unintentional.   TZIPORAH EGGERS TXH:[id number] DOB: 1950/06/04 DOA: 11/02/2013 PCP: Calvert Cantor, MD  Brief narrative: 63 year old female known history COPD, CHF, gait disorder resident at any [facility] assisted living facility admitted 11/02/13 with dizziness and fall Workup revealed chronic lacunar infarct and found to be slightly bradycardic on admission  Past medical history-As per Problem list Chart reviewed as below- None  Consultants:  none  Procedures:  mri  Antibiotics:  None   Subjective  Alert pleasant oriented no apparent distress Does not recall everything that happened to her before   Objective    Interim History: None  Telemetry:    Objective: Filed Vitals:   11/02/13 1741 11/02/13 2202 11/03/13 0604 11/03/13 0606  BP: 124/61 118/66 105/68 131/69  Pulse:  117 58 62  Temp:  98.3 F (36.8 C) 98.1 F (36.7 C)   TempSrc:  Oral    Resp:  20 20   Height: 5\' 1"  (1.549 m)     Weight: 45.2 kg (99 lb 10.4 oz)     SpO2:  96% 98% 97%    Intake/Output Summary (Last 24 hours) at 11/03/13 1433 Last data filed at 11/03/13 1400  Gross per 24 hour  Intake  822.5 ml  Output    700 ml  Net  122.5 ml    Exam:  General: Alert pleasant Cardiovascular:  S1-S2 no murmur rub or gallop Respiratory:  clinically clear Abdomen:  soft nontender nondistended   Data Reviewed: Basic Metabolic Panel:  Recent Labs Lab 11/02/13 1132 11/03/13 0546  NA 143 144  K 4.6 4.7  CL 103 107  CO2 28 25  GLUCOSE 84 72  BUN 15 13  CREATININE 0.78 0.69  CALCIUM 9.4 9.5   Liver Function Tests:  Recent Labs Lab 11/02/13 1132 11/03/13 0546  AST 36 31  ALT 46* 41*  ALKPHOS 73 84  BILITOT <0.2* 0.4  PROT 6.6 7.3  ALBUMIN 3.6 3.9   No results found for this basename: LIPASE, AMYLASE,  in the last 168  hours No results found for this basename: AMMONIA,  in the last 168 hours CBC:  Recent Labs Lab 11/02/13 1132 11/03/13 0546  WBC 7.3 6.9  NEUTROABS 5.6  --   HGB 12.8 13.7  HCT 38.5 41.8  MCV 92.8 94.1  PLT 267 269   Cardiac Enzymes:  Recent Labs Lab 11/02/13 1132 11/02/13 1845 11/02/13 2311 11/03/13 0546  TROPONINI <0.30 <0.30 <0.30 <0.30   BNP: No components found with this basename: POCBNP,  CBG:  Recent Labs Lab 11/02/13 0919  GLUCAP 81    Recent Results (from the past 240 hour(s))  MRSA PCR SCREENING     Status: None   Collection Time    11/02/13  7:00 PM      Result Value Ref Range Status   MRSA by PCR NEGATIVE  NEGATIVE Final   Comment:            The GeneXpert MRSA Assay (FDA     approved for NASAL specimens     only), is one component of a     comprehensive MRSA colonization     surveillance program. It is not     intended to diagnose MRSA     infection nor to guide or     monitor treatment for     MRSA infections.  Studies:              All Imaging reviewed and is as per above notation   Scheduled Meds: . aspirin  81 mg Oral Daily  . cholecalciferol  800 Units Oral Daily  . docusate sodium  100 mg Oral BID  . donepezil  5 mg Oral QHS  . furosemide  20 mg Oral Daily  . heparin  5,000 Units Subcutaneous 3 times per day  . levothyroxine  25 mcg Oral QAC breakfast  . LORazepam  0.5 mg Oral BID  . memantine  10 mg Oral BID  . mirtazapine  7.5 mg Oral QHS  . multivitamin with minerals  1 tablet Oral Daily  . sertraline  50 mg Oral QHS  . simvastatin  20 mg Oral QPM   Continuous Infusions: . 0.9 % NaCl with KCl 20 mEq / L 75 mL/hr at 11/03/13 [id number]     Assessment/Plan: 1.  syncopal episode-await echocardiogram. Carotid Doppler is negative.  MRI brain is negative for acute stroke. Telemetry doesn't show any acute finding-discontinue IV fluids. TSH within normal limits, HIV negative in the past RPR negative B12 was actually higher on  workup in the past 2.  Dementia with underlying neurological issues with progressive memory disorder-MMSE done March 2015 5/30 indicating severe deficits  Code Status:  presumed full Family Communication:  none at bedside Disposition Plan:  inpatient   Pleas KochJai Filimon Miranda, MD  [facility] Pager [phone] 11/03/2013, 2:33 PM    LOS: 1 day

## 2013-11-03 NOTE — Evaluation (Signed)
Physical Therapy Evaluation Patient Details Name: Kimberly [name] MRN: [id number] DOB: Jul 25, 1950 Today's Date: 11/03/2013   History of Present Illness  Kimberly [name] is a 63 y.o. female with a history of COPD, CHF, memory disorder, and gait disorder, who presents from a local assisted living facility [facility]) after she fell at the facility. The patient does not recall what happened except that she felt dizzy and she ended up on the floor. According to the ED notes, the nursing facility personnel stated that she slipped and fell backwards. They also reported a loss of consciousness. Currently, the patient has no headache, shortness of breath, chest pain, palpitations, or joint pain. She denies any recent nausea, vomiting, or diarrhea. All she remembers this morning prior to falling or passing out was that she became dizzy and fell. She does not recall having any other episodes similar to this in the past.  MRI of her cervical spine reveals arthropathy but no acute or dramatic finding. 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 Her urinalysis is unremarkable. Her EKG reveals sinus bradycardia with a heart rate of 56 beats per minute.   Clinical Impression  Patient presents to PT after referral from MD for assessment of mobility skills after a fall in her ALF.   Unable to obtain history or PLOF from patient as patient unable to answer questions appropriately during evaluation.  From medical history, patient lives in an ALF and uses a walker for ambulation.  During evaluation, patient was very impulsive with mobility skills, self transferring and attempting to ambulate without assist of walker or PT.  Max VC required for safe transfers/ambulation as patient is blind per history and unable to safety navigate environment, and is impulsive with functional mobility.  Patient was able  to perform bed mobility skills and transfers mod (I) with use of bed rails, and ambulate with 1 HHA and min guard for 10 feet to BSC.  Recommend continued PT while in the hospital to address activity tolerance, balance, and safety awareness with mobility skills and continued 24/7 care and HHPT after discharge.  No DME recommendations at this time, as per history patient amb with a personal walker.     Follow Up Recommendations Supervision/Assistance - 24 hour;Home health PT    Equipment Recommendations  None recommended by PT    Recommendations for Other Services OT consult;Speech consult     Precautions / Restrictions Precautions Precautions: Fall Precaution Comments: HOH, Blind per history.  Restrictions Weight Bearing Restrictions: No      Mobility  Bed Mobility Overal bed mobility: Modified Independent             General bed mobility comments: Used bed rail.   Transfers Overall transfer level: Modified independent Equipment used:  (Used bed rail)             General transfer comment: Impulsive; patient self transfered sit<-> stand before therapist had walker in place, despite VC to wait for therapist assistance.    Ambulation/Gait Ambulation/Gait assistance: Min guard Ambulation Distance (Feet): 10 Feet Assistive device: 1 person hand held assist Gait Pattern/deviations: Step-through pattern;Decreased step length - right;Decreased step length - left   Gait velocity interpretation: Below normal speed for age/gender General Gait Details: Impulsive; patient attempted amb to [facility] before therapist had walker in place and gait belt tightened, despite VC to wait for therapist assistance.  Balance Overall balance assessment: Needs assistance;History of Falls Sitting-balance support: Single extremity supported;Feet supported Sitting balance-Leahy Scale: Fair     Standing balance support: Single extremity supported;During functional activity (HHA for amb,  though patient hx states pt uses walker for amb.  Patient impulsive and did not wait for walker before self transferring/ambulation. )                                 Pertinent Vitals/Pain No pain reported.  No facial grimacing noted with functional mobility.     Home Living Family/patient expects to be discharged to:: Assisted living               Home Equipment: Walker - 2 wheels      Prior Function Level of Independence: Needs assistance   Gait / Transfers Assistance Needed: Per history, patient ambulated with a walker.  Unable to assess further history as patient unable to appropriately answer questions.               Extremity/Trunk Assessment               Lower Extremity Assessment: LLE deficits/detail;RLE deficits/detail RLE Deficits / Details: Unable to formally assess as patient unable to follow commands for MMT.  General LE strength 4/5 assessed through mobility skills.  LLE Deficits / Details: Unable to formally assess as patient unable to follow commands for MMT.  General LE strength 4/5 assessed through mobility skills.      Communication   Communication: HOH  Cognition   Behavior During Therapy: Impulsive Overall Cognitive Status: No family/caregiver present to determine baseline cognitive functioning                               Assessment/Plan    PT Assessment Patient needs continued PT services  PT Diagnosis Difficulty walking;Generalized weakness   PT Problem List Decreased activity tolerance;Decreased balance;Other (comment);Decreased knowledge of use of DME (Safety Awareness)  PT Treatment Interventions Balance training;Gait training;Neuromuscular re-education;Functional mobility training;Therapeutic activities;Therapeutic exercise   PT Goals (Current goals can be found in the Care Plan section) Acute Rehab PT Goals PT Goal Formulation: With patient Time For Goal Achievement: 11/17/13 Potential to Achieve  Goals: Good    Frequency Min 3X/week    End of Session Equipment Utilized During Treatment: Gait belt Activity Tolerance: Treatment limited secondary to agitation (Impulsive, agitated with cueing for safe transfers/ambulation.  ) Patient left: in bed;with call bell/phone within reach;with bed alarm set      Functional Assessment Tool Used:  (Clinical Observation) Functional Limitation: Mobility: Walking and moving around Mobility: Walking and Moving Around Current Status ([id number]([id number]): At least 1 percent but less than 20 percent impaired, limited or restricted Mobility: Walking and Moving Around Goal Status [phone](G8979): 0 percent impaired, limited or restricted Mobility: Walking and Moving Around Discharge Status [phone](G8980): 0 percent impaired, limited or restricted    Time: 0814-0829 PT Time Calculation (min): 15 min   Charges:   PT Evaluation $Initial PT Evaluation Tier I: 1 Procedure     PT G Codes:   Functional Assessment Tool Used:  (Clinical Observation) Functional Limitation: Mobility: Walking and moving around    [facility]iaWOOD[facility]TH,Kady Toothaker 11/03/2013, 8:44 AM

## 2013-11-04 NOTE — Clinical Social Work Note (Signed)
CSW spoke w [name] at [facility] - alerted to possible patient discharge today.  Requested that meds be available on FL2 before 3 PM so that they can be faxed to their pharmacy [facility] as soon as possible.  Oncoming CSW alerted to this.  Facility can be called to transport when patient is ready.  Santa GeneraAnne Hattye Siegfried, LCSW Clinical Social Worker [phone])

## 2013-11-04 NOTE — Plan of Care (Signed)
Problem: Discharge Progression Outcomes Goal: Discharge plan in place and appropriate Outcome: Completed/Met Date Met:  11/04/13 [facility]charged to [facility] Goal: Activity appropriate for discharge plan Outcome: Completed/Met Date Met:  11/04/13 Assist to [facility]

## 2013-11-04 NOTE — Clinical Social Work Note (Signed)
Pt d/c today back to facility. [facility] aware and agreeable. D/C summary and FL2 faxed. Facility to provide transport. See note from A. Cunningham earlier today regarding no family contact available to call regarding d/c.  Derenda FennelKara Mykell Rawl, LCSW [phone]

## 2013-11-04 NOTE — Clinical Social Work Note (Signed)
Per [facility], patient has had no contact w her children and they have no phone numbers for them.  Senaida OresJames Dozier, brother, has been their contact person.  CSW attempted to contact him w number listed on facesheet - number is incorrect.  Per Reggie, Dozier has been ill and has not been in contact for approx 6 months.  He will look in facility records to see if they have contact information and will call CSW.  Santa GeneraAnne Hillis Mcphatter, LCSW Clinical Social Worker [phone])

## 2013-11-04 NOTE — Discharge Summary (Addendum)
Physician Discharge Summary  Kimberly [name] ZOX:[id number] MarRN:[id number] DOB: September 12, 1950 DOA: 11/02/2013  PCP: Calvert CantorSYLVIA, MICHAEL, MD  Admit date: 11/02/2013 Discharge date: 11/04/2013  Time spent: 35 minutes  Recommendations for Outpatient Follow-up:  1. Recommend simplifying med on return to ALF.  Would consider lower doses of both Aricept and Namenda 2. Recommend goals of care as an outpatient given poor quality of life and mild dementia  Discharge Diagnoses:  Principal Problem:   Syncope and collapse Active Problems:   Chronic diastolic heart failure   Memory disorder   Blind   Hypothyroidism   History of CVA (cerebrovascular accident)   Bradycardia   Tobacco abuse   Discharge Condition: Fair  Diet recommendation: Regular  Filed Weights   11/02/13 0854 11/02/13 1741  Weight: 45.36 kg (100 lb) 45.2 kg (99 lb 10.4 oz)    History of present illness:  63 year old female known history COPD, CHF, gait disorder resident at any [facility] facility admitted 11/02/13 with dizziness and fall  Workup revealed chronic lacunar infarct and found to be slightly bradycardic on admission Echocardiogram showed no specific anomaly other than grade 1 diastolic dysfunction EF 50-60% and Carotid Doppler did not show any specific findings Cause of syncopy was thought medication related from Anti-cholinesterase inhibitors She had no further issues on telemetry and also did not have any further weakness or falls and was returned back to her assisted living facility as she had not maximal medical benefit from this admission    Discharge Exam: Filed Vitals:   11/04/13 0455  BP:   Pulse: 74  Temp: 98.5 F (36.9 C)  Resp: 20    General: EOMI, NCAT  Cardiovascular:  S1-S2 no murmur or gallop Respiratory:  clear he  Discharge Instructions You were cared for by a hospitalist during your hospital stay. If you have any questions about your discharge medications or the care you received while you  were in the hospital after you are discharged, you can call the unit and asked to speak with the hospitalist on call if the hospitalist that took care of you is not available. Once you are discharged, your primary care physician will handle any further medical issues. Please note that NO REFILLS for any discharge medications will be authorized once you are discharged, as it is imperative that you return to your primary care physician (or establish a relationship with a primary care physician if you do not have one) for your aftercare needs so that they can reassess your need for medications and monitor your lab values.  Discharge Instructions   Diet - low sodium heart healthy    Complete by:  As directed      Increase activity slowly    Complete by:  As directed             Medication List         acetaminophen 325 MG tablet  Commonly known as:  TYLENOL  Take 650 mg by mouth 3 (three) times daily.     cholecalciferol 400 UNITS Tabs tablet  Commonly known as:  VITAMIN D  Take 800 Units by mouth daily.     donepezil 5 MG tablet  Commonly known as:  ARICEPT  TAKE 1 TABLET BY MOUTH AT BEDTIME.     furosemide 20 MG tablet  Commonly known as:  LASIX  Take 20 mg by mouth daily.     levothyroxine 25 MCG tablet  Commonly known as:  SYNTHROID, LEVOTHROID  Take 25 mcg by mouth  daily.     LORazepam 0.5 MG tablet  Commonly known as:  ATIVAN  Take 0.5 mg by mouth 2 (two) times daily.     memantine 10 MG tablet  Commonly known as:  NAMENDA  Take 10 mg by mouth 2 (two) times daily.     mirtazapine 15 MG tablet  Commonly known as:  REMERON  Take 7.5 mg by mouth at bedtime.     multivitamins ther. w/minerals Tabs tablet  Take 1 tablet by mouth daily.     ondansetron 4 MG tablet  Commonly known as:  ZOFRAN  Take 4 mg by mouth every 6 (six) hours as needed for nausea.     polyethylene glycol powder powder  Commonly known as:  GLYCOLAX/MIRALAX  Take 17 g by mouth daily as needed.  constipation     sertraline 50 MG tablet  Commonly known as:  ZOLOFT  Take 50 mg by mouth at bedtime.     simvastatin 20 MG tablet  Commonly known as:  ZOCOR  Take 20 mg by mouth every evening.       No Known Allergies    The results of significant diagnostics from this hospitalization (including imaging, microbiology, ancillary and laboratory) are listed below for reference.    Significant Diagnostic Studies: Dg Hip Bilateral W/pelvis  11/02/2013   CLINICAL DATA:  Status post fall  EXAM: BILATERAL HIP WITH PELVIS - 4+ VIEW  COMPARISON:  Lumbar spine of August 20, 2012  FINDINGS: The bony pelvis is intact. There are degenerative changes of the lumbar spine. AP and lateral views of the hips. No fracture or dislocation or significant degenerative change.  IMPRESSION: There is no acute bony abnormality of the hips or bony pelvis.   Electronically Signed   By: David  [place]   On: 11/02/2013 11:58   Mr Brain Wo Contrast  11/02/2013   CLINICAL DATA:  63 year old female with fall with head injury. Diffuse pain and headache. Initial encounter. History of breast cancer.  EXAM: MRI HEAD WITHOUT CONTRAST  TECHNIQUE: Multiplanar, multiecho pulse sequences of the brain and surrounding structures were obtained without intravenous contrast.  COMPARISON:  Head CTs 08/11/2013 and earlier.  FINDINGS: Cerebral volume is within normal limits for age. Major intracranial vascular flow voids are preserved. Dominant distal right vertebral artery suspected.  There is an oval T2 hyperintense cystic appearing lesion of the sella turcica. 7 x 12 x 9 mm (AP by transverse by CC). 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 No suprasellar extension or suprasellar mass effect. There is evidence of this on prior studies back to 08/20/2012, on the basis of low CT density within the right sella.  No restricted diffusion to suggest acute infarction. No midline shift, mass effect, evidence of mass lesion,  ventriculomegaly, extra-axial collection or acute intracranial hemorrhage. Cervicomedullary junction within normal limits. Cervical spine findings are reported separately.  Focal encephalomalacia in the medial right thalamus is new from prior studies and most resembles a chronic lacunar infarct. No cortical encephalomalacia. Other deep gray matter nuclei are within normal limits for age. Brainstem and cerebellum are within normal limits. Visible internal auditory structures appear normal. Visualized bone marrow signal is within normal limits. Mildly heterogeneous bone marrow signal, including at the dorsal clivus, which had normal bone mineralization recently. Mastoids are clear. Negative paranasal sinuses. Postoperative changes to the left globe. Negative right orbit soft tissues. Visualized scalp soft tissues are within normal limits.  IMPRESSION: 1.  No acute intracranial abnormality. 2.  Chronic lacunar infarct medial right thalamus appears to be new since 08/11/2013. 3. 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 If clinically desired, dedicated pituitary protocol MRI could evaluate further.   Electronically Signed   By: Augusto Gamble M.D.   On: 11/02/2013 13:23   Mr Cervical Spine Wo Contrast  11/02/2013   CLINICAL DATA:  Neck pain and headache after a fall. History of breast cancer.  EXAM: MRI CERVICAL SPINE WITHOUT CONTRAST  TECHNIQUE: Multiplanar, multisequence MR imaging of the cervical spine was performed. No intravenous contrast was administered.  COMPARISON:  08/20/2012 CT  FINDINGS: The study suffers from considerable motion degradation. Water sensitive imaging does not show evidence of rib fracture or soft tissue edema.  The foramen magnum is widely patent. C1-2 and C2-3 are unremarkable.  C3-4: Spondylosis with endplate osteophytes and bulging of the disc. Facet degeneration on the left. The  central canal is sufficiently patent. Mild foraminal narrowing on the right and moderate foraminal narrowing on the left.  C4-5: Bilateral facet arthropathy with anterolisthesis of 2 mm. This is worse on the right. Bulging of the disc. Narrowing of the subarachnoid space but no compression of the cord. Foraminal narrowing on the right.  C5-6: Spondylosis with endplate osteophytes and bulging of the disc. Narrowing of the ventral subarachnoid space but no cord compression. Foraminal narrowing bilaterally.  C6-7: Spondylosis with endplate osteophytes and protruding disc material more prominent on the right. No compressive central canal stenosis. Foraminal narrowing on the right that could affect the C7 nerve root.  C7-T1:  Mild bulging of the disc.  No canal or foraminal stenosis.  IMPRESSION: No acute or traumatic finding evident. The examination does suffer from considerable motion degradation.  No compressive stenosis of the central canal. Facet arthropathy most pronounced at C4-5 with anterolisthesis of 2 mm.  Foraminal narrowing that could possibly cause neural compression. Most notable levels include C3-4 on the left, C4-5 on the right, C5-6 bilaterally and C6-7 on the right.   Electronically Signed   By: Paulina Fusi M.D.   On: 11/02/2013 13:17   US Carotid Bilateral  11/02/2013   CLINICAL DATA:  Syncope.  EXAM: BILATERAL CAROTID DUPLEX ULTRASOUND  TECHNIQUE: Wallace Cullens scale imaging, color Doppler and duplex ultrasound were performed of bilateral carotid and vertebral arteries in the neck.  COMPARISON:  None.  FINDINGS: Criteria: Quantification of carotid stenosis is based on velocity parameters that correlate the residual internal carotid diameter with NASCET-based stenosis levels, using the diameter of the distal internal carotid lumen as the denominator for stenosis measurement.  The following velocity measurements were obtained:  RIGHT  ICA:  70 cm/sec  CCA:  78 cm/sec  SYSTOLIC ICA/CCA RATIO:  0.9  DIASTOLIC  ICA/CCA RATIO:  1.9  ECA:  65 cm/sec  LEFT  ICA:  85 cm/sec  CCA:  52 cm/sec  SYSTOLIC ICA/CCA RATIO:  1.7  DIASTOLIC ICA/CCA RATIO:  2.6  ECA:  65 cm/sec  RIGHT CAROTID ARTERY: Echogenic plaques in the proximal internal carotid artery. Normal waveforms and velocities in the right internal carotid artery.  RIGHT VERTEBRAL ARTERY: Antegrade flow and normal waveform in the right vertebral artery.  LEFT CAROTID ARTERY: Small amount of plaque in the left internal carotid artery. Normal waveforms and velocities in the left internal carotid artery.  LEFT VERTEBRAL ARTERY: Antegrade flow and normal waveform in the left vertebral artery.  IMPRESSION: Mild  atherosclerotic disease in the carotid arteries. No significant stenosis. Estimated degree of stenosis in the internal carotid arteries is less than 50% bilaterally.  Patent vertebral arteries.   Electronically Signed   By: Richarda OverlieAdam  Henn M.D.   On: 11/02/2013 18:51   Dg Chest Port 1 View  11/02/2013   CLINICAL DATA:  Altered mental status, syncope  EXAM: PORTABLE CHEST - 1 VIEW  COMPARISON:  08/20/2012  FINDINGS: The heart size and mediastinal contours are within normal limits. Both lungs are clear. The visualized skeletal structures are unremarkable.  IMPRESSION: No active disease.   Electronically Signed   By: Marlan Palauharles  Clark M.D.   On: 11/02/2013 18:08    Microbiology: Recent Results (from the past 240 hour(s))  MRSA PCR SCREENING     Status: None   Collection Time    11/02/13  7:00 PM      Result Value Ref Range Status   MRSA by PCR NEGATIVE  NEGATIVE Final   Comment:            The GeneXpert MRSA Assay (FDA     approved for NASAL specimens     only), is one component of a     comprehensive MRSA colonization     surveillance program. It is not     intended to diagnose MRSA     infection nor to guide or     monitor treatment for     MRSA infections.     Labs: Basic Metabolic Panel:  Recent Labs Lab 11/02/13 1132 11/03/13 0546  NA 143 144  K  4.6 4.7  CL 103 107  CO2 28 25  GLUCOSE 84 72  BUN 15 13  CREATININE 0.78 0.69  CALCIUM 9.4 9.5   Liver Function Tests:  Recent Labs Lab 11/02/13 1132 11/03/13 0546  AST 36 31  ALT 46* 41*  ALKPHOS 73 84  BILITOT <0.2* 0.4  PROT 6.6 7.3  ALBUMIN 3.6 3.9   No results found for this basename: LIPASE, AMYLASE,  in the last 168 hours No results found for this basename: AMMONIA,  in the last 168 hours CBC:  Recent Labs Lab 11/02/13 1132 11/03/13 0546  WBC 7.3 6.9  NEUTROABS 5.6  --   HGB 12.8 13.7  HCT 38.5 41.8  MCV 92.8 94.1  PLT 267 269   Cardiac Enzymes:  Recent Labs Lab 11/02/13 1132 11/02/13 1845 11/02/13 2311 11/03/13 0546  TROPONINI <0.30 <0.30 <0.30 <0.30   BNP: BNP (last 3 results)  Recent Labs  11/02/13 1132  PROBNP 151.7*   CBG:  Recent Labs Lab 11/02/13 0919  GLUCAP 81       Signed:  Lisia Westbay, JAI-GURMUKH  [facility] 11/04/2013, 3:10 PM

## 2013-11-30 ENCOUNTER — Telehealth: Payer: Self-pay | Admitting: *Deleted

## 2013-11-30 NOTE — Telephone Encounter (Signed)
Called both numbers for patient and was informed I had the wrong number, Willis patient never seen by NP LL, appointment was changed with NP MM.

## 2013-12-06 ENCOUNTER — Encounter (INDEPENDENT_AMBULATORY_CARE_PROVIDER_SITE_OTHER): Payer: Self-pay

## 2013-12-06 ENCOUNTER — Encounter: Payer: Self-pay | Admitting: Adult Health

## 2013-12-06 ENCOUNTER — Ambulatory Visit (INDEPENDENT_AMBULATORY_CARE_PROVIDER_SITE_OTHER): Payer: Medicaid Other | Admitting: Adult Health

## 2013-12-06 VITALS — BP 110/70 | HR 74 | Wt 97.0 lb

## 2013-12-06 DIAGNOSIS — R413 Other amnesia: Secondary | ICD-10-CM

## 2013-12-06 DIAGNOSIS — R269 Unspecified abnormalities of gait and mobility: Secondary | ICD-10-CM

## 2013-12-06 NOTE — Progress Notes (Signed)
PATIENT: Kimberly [name] Files DOB: [birth year]  REASON FOR VISIT: follow up HISTORY FROM: patient  HISTORY OF PRESENT ILLNESS: [name] is a 63 year old female with a history of memory disorder, malnutrition and abnormality of gait. She returns for follow-up. The patient currently takes Aricept and Namenda. Aricept was decreased at the last visit due to weight loss and diarrhea. [facility] Her caregiver is with her today. Caregiver states that everything is going well. Patient states that she "[facility] Caregiver states that she fell 6 weeks ago but did not sustain any injuries. She currently uses a walker to ambulate. Patient needs assistance with ADLs.  Patient was unable to complete MMSE due to her vision. No new medical issues since last seen.   REVIEW OF SYSTEMS: Full 14 system review of systems performed and notable only for:  Constitutional: Fatigue Eyes: Loss of vision Ear/Nose/Throat: N/A  Skin: N/A  Cardiovascular: N/A  Respiratory: N/A  Gastrointestinal: N/A  Genitourinary: N/A Hematology/Lymphatic: N/A  Endocrine: Cold intolerance Musculoskeletal: Aching muscles Allergy/Immunology: N/A  Neurological: Dizziness  Psychiatric: Confusion Sleep: N/A   ALLERGIES: No Known Allergies  HOME MEDICATIONS: Outpatient Prescriptions Prior to Visit  Medication Sig Dispense Refill  . acetaminophen (TYLENOL) 325 MG tablet Take 650 mg by mouth 3 (three) times daily.        . cholecalciferol (VITAMIN D) 400 UNITS TABS Take 800 Units by mouth daily.        Marland Kitchen. donepezil (ARICEPT) 5 MG tablet TAKE 1 TABLET BY MOUTH AT BEDTIME.  30 tablet  3  . furosemide (LASIX) 20 MG tablet Take 20 mg by mouth daily.        Marland Kitchen. levothyroxine (SYNTHROID, LEVOTHROID) 25 MCG tablet Take 25 mcg by mouth daily.        Marland Kitchen. LORazepam (ATIVAN) 0.5 MG tablet Take 0.5 mg by mouth 2 (two) times daily.       . memantine (NAMENDA) 10 MG tablet Take 10 mg by mouth 2  (two) times daily.      . mirtazapine (REMERON) 15 MG tablet Take 7.5 mg by mouth at bedtime.      . Multiple Vitamins-Minerals (MULTIVITAMINS THER. W/MINERALS) TABS Take 1 tablet by mouth daily.        . ondansetron (ZOFRAN) 4 MG tablet Take 4 mg by mouth every 6 (six) hours as needed for nausea.       . polyethylene glycol powder (GLYCOLAX/MIRALAX) powder Take 17 g by mouth daily as needed. constipation      . sertraline (ZOLOFT) 50 MG tablet Take 50 mg by mouth at bedtime.      . simvastatin (ZOCOR) 20 MG tablet Take 20 mg by mouth every evening.       No facility-administered medications prior to visit.    PAST MEDICAL HISTORY: Past Medical History  Diagnosis Date  . COPD (chronic obstructive pulmonary disease)   . CHF (congestive heart failure)   . Anemia   . Arthritis     osteoarthritis  . Hypothyroidism   . HOH (hard of hearing)   . Pneumonia   . Shortness of breath   . Depression   . DDD (degenerative disc disease)   . Sepsis   . Memory disorder 08/02/2013  . Abnormality of gait 08/02/2013  . Blind   . Tobacco abuse 11/02/2013    PAST SURGICAL HISTORY: Past Surgical History  Procedure Laterality Date  . Cesarean section    .  Arm debridement      as child  . Strabismus surgery      age 32  . Cataract extraction w/phaco  04/28/2011    Procedure: CATARACT EXTRACTION PHACO AND INTRAOCULAR LENS PLACEMENT (IOC);  Surgeon: Susa Simmonds;  Location: [facility];  Service: Ophthalmology;  Laterality: Left;  CDE: 5.37  . Colonoscopy  06/05/2009    RMR: nl rectum, left-sided diverticula, colonoscopy performed due to question of colovesicular fistula     FAMILY HISTORY: Family History  Problem Relation Age of Onset  . Colon cancer      neg hx?   . Diabetes Brother   . Diabetes Brother   . Diabetes Mother   . Cancer - Lung Father     SOCIAL HISTORY: History   Social History  . Marital Status: Divorced    Spouse Name: N/A    Number of Children: 2  . Years of  Education: hs   Occupational History  . Disabled    Social History Main Topics  . Smoking status: Former Smoker -- 0.50 packs/day    Types: Cigarettes  . Smokeless tobacco: Never Used  . Alcohol Use: No  . Drug Use: No  . Sexual Activity: No   Other Topics Concern  . Not on file   Social History Narrative   Patient is single and lives at [facility].   Patient has two adult children.   Patient is retired.   Patient has a high school education.   Patient is left-handed.   Patient drinks two cups of coffee and tea daily.      PHYSICAL EXAM  Filed Vitals:   12/06/13 0929  BP: 110/70  Pulse: 74  Weight: 97 lb (43.999 kg)   Body mass index is 18.34 kg/(m^2).  Generalized: Well developed, in no acute distress   Neurological examination  Mentation:  Follows all commands speech and language fluent. Cranial nerve II-XII: Uvula tongue midline. Head turning and shoulder shrug  were normal and symmetric. Motor: The motor testing reveals 5 over 5 strength of all 4 extremities. Good symmetric motor tone is noted throughout.  Sensory: Sensory testing is intact to soft touch on all 4 extremities. No evidence of extinction is noted.  Coordination: unable to test finger-nose-finger due to blindness Gait and station: uses a walker to ambulate.  Reflexes: Deep tendon reflexes are symmetric and normal bilaterally.   DIAGNOSTIC DATA (LABS, IMAGING, TESTING) - I reviewed patient records, labs, notes, testing and imaging myself where available.  Lab Results  Component Value Date   WBC 6.9 11/03/2013   HGB 13.7 11/03/2013   HCT 41.8 11/03/2013   MCV 94.1 11/03/2013   PLT 269 11/03/2013      Component Value Date/Time   NA 144 11/03/2013 0546   K 4.7 11/03/2013 0546   CL 107 11/03/2013 0546   CO2 25 11/03/2013 0546   GLUCOSE 72 11/03/2013 0546   BUN 13 11/03/2013 0546   CREATININE 0.69 11/03/2013 0546   CALCIUM 9.5 11/03/2013 0546   PROT 7.3 11/03/2013 0546   ALBUMIN 3.9 11/03/2013  0546   AST 31 11/03/2013 0546   ALT 41* 11/03/2013 0546   ALKPHOS 84 11/03/2013 0546   BILITOT 0.4 11/03/2013 0546   GFRNONAA >90 11/03/2013 0546   GFRAA >90 11/03/2013 0546    Lab Results  Component Value Date   VITAMINB12 1178* 08/02/2013   Lab Results  Component Value Date   TSH 0.675 11/02/2013      ASSESSMENT  AND PLAN 63 y.o. year old female  has a past medical history of COPD (chronic obstructive pulmonary disease); CHF (congestive heart failure); Anemia; Arthritis; Hypothyroidism; HOH (hard of hearing); Pneumonia; Shortness of breath; Depression; DDD (degenerative disc disease); Sepsis; Memory disorder (08/02/2013); Abnormality of gait (08/02/2013); Blind; and Tobacco abuse (11/02/2013). here with:  1. Memory disorder 2. Abnormality of gait  Patient has remained stable. She currently takes Aricept and Namenda. Her weight has remained stable, she has gained 3 pounds since the last visit. We will continue 5 mg of Aricept, I will not increase the dose at this time. Patient should continue Namenda as well. Unable to complete the MMSE due to patient's loss of vision. Patient should followup in 6 months or sooner if needed.   Butch Penny, MSN, NP-C 12/06/2013, 9:46 AM [facility] [address], [address] [place], [place] [address] [phone]  Note: This document was prepared with digital dictation and possible smart phrase technology. Any transcriptional errors that result from this process are unintentional.

## 2013-12-06 NOTE — Progress Notes (Signed)
I have read the note, and I agree with the clinical assessment and plan.  Shalaya Swailes KEITH   

## 2013-12-06 NOTE — Patient Instructions (Addendum)
Continue taking Aricept and Namenda for memory.   Dementia Dementia is a general term for problems with brain function. A person with dementia has memory loss and a hard time with at least one other brain function such as thinking, speaking, or problem solving. Dementia can affect social functioning, how you do your job, your mood, or your personality. The changes may be hidden for a long time. The earliest forms of this disease are usually not detected by family or friends. Dementia can be:  Irreversible.  Potentially reversible.  Partially reversible.  Progressive. This means it can get worse over time. CAUSES  Irreversible dementia causes may include:  Degeneration of brain cells (Alzheimer's disease or lewy body dementia).  Multiple small strokes (vascular dementia).  Infection (chronic meningitis or Creutzfelt-Jakob disease).  Frontotemporal dementia. This affects younger people, age 64 to 3170, compared to those who have Alzheimer's disease.  Dementia associated with other disorders like Parkinson's disease, Huntington's disease, or HIV-associated dementia. Potentially or partially reversible dementia causes may include:  Medicines.  Metabolic causes such as excessive alcohol intake, vitamin B12 deficiency, or thyroid disease.  Masses or pressure in the brain such as a tumor, blood clot, or hydrocephalus. SYMPTOMS  Symptoms are often hard to detect. Family members or coworkers may not notice them early in the disease process. Different people with dementia may have different symptoms. Symptoms can include:  A hard time with memory, especially recent memory. Long-term memory may not be impaired.  Asking the same question multiple times or forgetting something someone just said.  A hard time speaking your thoughts or finding certain words.  A hard time solving problems or performing familiar tasks (such as how to use a telephone).  Sudden changes in mood.  Changes in  personality, especially increasing moodiness or mistrust.  Depression.  A hard time understanding complex ideas that were never a problem in the past. DIAGNOSIS  There are no specific tests for dementia.   Your caregiver may recommend a thorough evaluation. This is because some forms of dementia can be reversible. The evaluation will likely include a physical exam and getting a detailed history from you and a family member. The history often gives the best clues and suggestions for a diagnosis.  Memory testing may be done. A detailed brain function evaluation called neuropsychologic testing may be helpful.  Lab tests and brain imaging (such as a CT scan or MRI scan) are sometimes important.  Sometimes observation and re-evaluation over time is very helpful. TREATMENT  Treatment depends on the cause.   If the problem is a vitamin deficiency, it may be helped or cured with supplements.  For dementias such as Alzheimer's disease, medicines are available to stabilize or slow the course of the disease. There are no cures for this type of dementia.  Your caregiver can help direct you to groups, organizations, and other caregivers to help with decisions in the care of you or your loved one. HOME CARE INSTRUCTIONS The care of individuals with dementia is varied and dependent upon the progression of the dementia. The following suggestions are intended for the person living with, or caring for, the person with dementia.  Create a safe environment.  Remove the locks on bathroom doors to prevent the person from accidentally locking himself or herself in.  Use childproof latches on kitchen cabinets and any place where cleaning supplies, chemicals, or alcohol are kept.  Use childproof covers in unused electrical outlets.  Install childproof devices to keep doors and  windows secured.  Remove stove knobs or install safety knobs and an automatic shut-off on the stove.  Lower the temperature on  water heaters.  Label medicines and keep them locked up.  Secure knives, lighters, matches, power tools, and guns, and keep these items out of reach.  Keep the house free from clutter. Remove rugs or anything that might contribute to a fall.  Remove objects that might break and hurt the person.  Make sure lighting is good, both inside and outside.  Install grab rails as needed.  Use a monitoring device to alert you to falls or other needs for help.  Reduce confusion.  Keep familiar objects and people around.  Use night lights or dim lights at night.  Label items or areas.  Use reminders, notes, or directions for daily activities or tasks.  Keep a simple, consistent routine for waking, meals, bathing, dressing, and bedtime.  Create a calm, quiet environment.  Place large clocks and calendars prominently.  Display emergency numbers and home address near all telephones.  Use cues to establish different times of the day. An example is to open curtains to let the natural light in during the day.   Use effective communication.  Choose simple words and short sentences.  Use a gentle, calm tone of voice.  Be careful not to interrupt.  If the person is struggling to find a word or communicate a thought, try to provide the word or thought.  Ask one question at a time. Allow the person ample time to answer questions. Repeat the question again if the person does not respond.  Reduce nighttime restlessness.  Provide a comfortable bed.  Have a consistent nighttime routine.  Ensure a regular walking or physical activity schedule. Involve the person in daily activities as much as possible.  Limit napping during the day.  Limit caffeine.  Attend social events that stimulate rather than overwhelm the senses.  Encourage good nutrition and hydration.  Reduce distractions during meal times and snacks.  Avoid foods that are too hot or too cold.  Monitor chewing and  swallowing ability.  Continue with routine vision, hearing, dental, and medical screenings.  Only give over-the-counter or prescription medicines as directed by the caregiver.  Monitor driving abilities. Do not allow the person to drive when safe driving is no longer possible.  Register with an identification program which could provide location assistance in the event of a missing person situation. SEEK MEDICAL CARE IF:   New behavioral problems start such as moodiness, aggressiveness, or seeing things that are not there (hallucinations).  Any new problem with brain function happens. This includes problems with balance, speech, or falling a lot.  Problems with swallowing develop.  Any symptoms of other illness happen. Small changes or worsening in any aspect of brain function can be a sign that the illness is getting worse. It can also be a sign of another medical illness such as infection. Seeing a caregiver right away is important. SEEK IMMEDIATE MEDICAL CARE IF:   A fever develops.  New or worsened confusion develops.  New or worsened sleepiness develops.  Staying awake becomes hard to do. Document Released: 11/05/2000 Document Revised: 08/04/2011 Document Reviewed: 10/07/2010 [facility] Patient Information 2015 [place], [place]. This information is not intended to replace advice given to you by your health care provider. Make sure you discuss any questions you have with your health care provider.

## 2013-12-20 ENCOUNTER — Other Ambulatory Visit: Payer: Self-pay | Admitting: Neurology

## 2014-03-27 ENCOUNTER — Encounter: Payer: Self-pay | Admitting: Adult Health

## 2014-04-12 ENCOUNTER — Encounter: Payer: Self-pay | Admitting: Neurology

## 2014-04-18 ENCOUNTER — Encounter: Payer: Self-pay | Admitting: Neurology

## 2014-06-13 ENCOUNTER — Ambulatory Visit: Payer: Medicaid Other | Admitting: Adult Health

## 2015-01-12 ENCOUNTER — Other Ambulatory Visit: Payer: Self-pay | Admitting: Neurology

## 2016-07-14 ENCOUNTER — Emergency Department (HOSPITAL_COMMUNITY): Payer: Medicare Other

## 2016-07-14 ENCOUNTER — Inpatient Hospital Stay (HOSPITAL_COMMUNITY)
Admission: EM | Admit: 2016-07-14 | Discharge: 2016-07-18 | DRG: 065 | Disposition: A | Discharge status: Home Health | Payer: Medicare Other | Attending: Neurology | Admitting: Neurology

## 2016-07-14 ENCOUNTER — Encounter (HOSPITAL_COMMUNITY): Payer: Self-pay | Admitting: *Deleted

## 2016-07-14 ENCOUNTER — Inpatient Hospital Stay (HOSPITAL_COMMUNITY): Payer: Medicare Other

## 2016-07-14 DIAGNOSIS — R471 Dysarthria and anarthria: Secondary | ICD-10-CM | POA: Diagnosis present

## 2016-07-14 DIAGNOSIS — G8191 Hemiplegia, unspecified affecting right dominant side: Secondary | ICD-10-CM | POA: Diagnosis present

## 2016-07-14 DIAGNOSIS — H9190 Unspecified hearing loss, unspecified ear: Secondary | ICD-10-CM | POA: Diagnosis present

## 2016-07-14 DIAGNOSIS — H547 Unspecified visual loss: Secondary | ICD-10-CM

## 2016-07-14 DIAGNOSIS — I639 Cerebral infarction, unspecified: Secondary | ICD-10-CM | POA: Diagnosis not present

## 2016-07-14 DIAGNOSIS — J449 Chronic obstructive pulmonary disease, unspecified: Secondary | ICD-10-CM | POA: Diagnosis present

## 2016-07-14 DIAGNOSIS — R413 Other amnesia: Secondary | ICD-10-CM | POA: Diagnosis present

## 2016-07-14 DIAGNOSIS — I5032 Chronic diastolic (congestive) heart failure: Secondary | ICD-10-CM | POA: Diagnosis not present

## 2016-07-14 DIAGNOSIS — E039 Hypothyroidism, unspecified: Secondary | ICD-10-CM | POA: Diagnosis present

## 2016-07-14 DIAGNOSIS — M1990 Unspecified osteoarthritis, unspecified site: Secondary | ICD-10-CM | POA: Diagnosis not present

## 2016-07-14 DIAGNOSIS — N390 Urinary tract infection, site not specified: Secondary | ICD-10-CM | POA: Diagnosis present

## 2016-07-14 DIAGNOSIS — Z8673 Personal history of transient ischemic attack (TIA), and cerebral infarction without residual deficits: Secondary | ICD-10-CM

## 2016-07-14 DIAGNOSIS — H269 Unspecified cataract: Secondary | ICD-10-CM | POA: Diagnosis not present

## 2016-07-14 DIAGNOSIS — Z87891 Personal history of nicotine dependence: Secondary | ICD-10-CM | POA: Diagnosis not present

## 2016-07-14 DIAGNOSIS — I110 Hypertensive heart disease with heart failure: Secondary | ICD-10-CM | POA: Diagnosis present

## 2016-07-14 DIAGNOSIS — I6340 Cerebral infarction due to embolism of unspecified cerebral artery: Secondary | ICD-10-CM | POA: Diagnosis not present

## 2016-07-14 DIAGNOSIS — F0390 Unspecified dementia without behavioral disturbance: Secondary | ICD-10-CM | POA: Diagnosis present

## 2016-07-14 DIAGNOSIS — B961 Klebsiella pneumoniae [K. pneumoniae] as the cause of diseases classified elsewhere: Secondary | ICD-10-CM | POA: Diagnosis not present

## 2016-07-14 DIAGNOSIS — F329 Major depressive disorder, single episode, unspecified: Secondary | ICD-10-CM | POA: Diagnosis present

## 2016-07-14 DIAGNOSIS — E785 Hyperlipidemia, unspecified: Secondary | ICD-10-CM | POA: Diagnosis present

## 2016-07-14 DIAGNOSIS — R339 Retention of urine, unspecified: Secondary | ICD-10-CM | POA: Clinically undetermined

## 2016-07-14 DIAGNOSIS — Z9282 Status post administration of tPA (rtPA) in a different facility within the last 24 hours prior to admission to current facility: Secondary | ICD-10-CM

## 2016-07-14 DIAGNOSIS — R4701 Aphasia: Secondary | ICD-10-CM | POA: Diagnosis present

## 2016-07-14 DIAGNOSIS — I10 Essential (primary) hypertension: Secondary | ICD-10-CM | POA: Diagnosis present

## 2016-07-14 LAB — DIFFERENTIAL
Basophils Absolute: 0.1 10*3/uL (ref 0.0–0.1)
Basophils Relative: 1 %
Eosinophils Absolute: 0.2 10*3/uL (ref 0.0–0.7)
Eosinophils Relative: 2 %
Lymphocytes Relative: 26 %
Lymphs Abs: 1.9 10*3/uL (ref 0.7–4.0)
Monocytes Absolute: 0.5 10*3/uL (ref 0.1–1.0)
Monocytes Relative: 7 %
Neutro Abs: 4.7 10*3/uL (ref 1.7–7.7)
Neutrophils Relative %: 64 %

## 2016-07-14 LAB — I-STAT TROPONIN, ED: Troponin i, poc: 0.01 ng/mL (ref 0.00–0.08)

## 2016-07-14 LAB — CBC
HCT: 47 % — ABNORMAL HIGH (ref 36.0–46.0)
Hemoglobin: 15.1 g/dL — ABNORMAL HIGH (ref 12.0–15.0)
MCH: 30 pg (ref 26.0–34.0)
MCHC: 32.1 g/dL (ref 30.0–36.0)
MCV: 93.3 fL (ref 78.0–100.0)
Platelets: 317 10*3/uL (ref 150–400)
RBC: 5.04 MIL/uL (ref 3.87–5.11)
RDW: 12.9 % (ref 11.5–15.5)
WBC: 7.4 10*3/uL (ref 4.0–10.5)

## 2016-07-14 LAB — GLUCOSE, CAPILLARY
GLUCOSE-CAPILLARY: 96 mg/dL (ref 65–99)
Glucose-Capillary: 100 mg/dL — ABNORMAL HIGH (ref 65–99)
Glucose-Capillary: 80 mg/dL (ref 65–99)
Glucose-Capillary: 93 mg/dL (ref 65–99)

## 2016-07-14 LAB — URINALYSIS, MICROSCOPIC (REFLEX): SQUAMOUS EPITHELIAL / LPF: NONE SEEN

## 2016-07-14 LAB — URINALYSIS, ROUTINE W REFLEX MICROSCOPIC
Bilirubin Urine: NEGATIVE
GLUCOSE, UA: NEGATIVE mg/dL
Ketones, ur: 5 mg/dL — AB
Leukocytes, UA: NEGATIVE
Nitrite: NEGATIVE
PH: 8 (ref 5.0–8.0)
PROTEIN: 30 mg/dL — AB
SPECIFIC GRAVITY, URINE: 1.011 (ref 1.005–1.030)

## 2016-07-14 LAB — MRSA PCR SCREENING: MRSA by PCR: NEGATIVE

## 2016-07-14 LAB — APTT: aPTT: 28 seconds (ref 24–36)

## 2016-07-14 LAB — COMPREHENSIVE METABOLIC PANEL
ALT: 19 U/L (ref 14–54)
AST: 25 U/L (ref 15–41)
Albumin: 4.4 g/dL (ref 3.5–5.0)
Alkaline Phosphatase: 70 U/L (ref 38–126)
Anion gap: 9 (ref 5–15)
BUN: 16 mg/dL (ref 6–20)
CO2: 30 mmol/L (ref 22–32)
Calcium: 9.8 mg/dL (ref 8.9–10.3)
Chloride: 101 mmol/L (ref 101–111)
Creatinine, Ser: 0.85 mg/dL (ref 0.44–1.00)
GFR calc Af Amer: >60 mL/min (ref >60)
GFR calc non Af Amer: >60 mL/min (ref >60)
Glucose, Bld: 102 mg/dL — ABNORMAL HIGH (ref 65–99)
Potassium: 3.8 mmol/L (ref 3.5–5.1)
Sodium: 140 mmol/L (ref 135–145)
Total Bilirubin: 0.5 mg/dL (ref 0.3–1.2)
Total Protein: 7.9 g/dL (ref 6.5–8.1)

## 2016-07-14 LAB — RAPID URINE DRUG SCREEN, HOSP PERFORMED
Amphetamines: NOT DETECTED
BARBITURATES: NOT DETECTED
BENZODIAZEPINES: POSITIVE — AB
COCAINE: NOT DETECTED
OPIATES: NOT DETECTED
Tetrahydrocannabinol: NOT DETECTED

## 2016-07-14 LAB — PROTIME-INR
INR: 0.92
Prothrombin Time: 12.3 seconds (ref 11.4–15.2)

## 2016-07-14 LAB — CBG MONITORING, ED: GLUCOSE-CAPILLARY: 82 mg/dL (ref 65–99)

## 2016-07-14 LAB — ETHANOL: Alcohol, Ethyl (B): <5 mg/dL (ref <5)

## 2016-07-14 MED ORDER — STROKE: EARLY STAGES OF RECOVERY BOOK
Freq: Once | Status: AC
  Administered 2016-07-14: 12:00:00
  Filled 2016-07-14: qty 1

## 2016-07-14 MED ORDER — PANTOPRAZOLE SODIUM 40 MG IV SOLR
40.0000 mg | Freq: Every day | INTRAVENOUS | Status: DC
  Administered 2016-07-14 – 2016-07-15 (×2): 40 mg via INTRAVENOUS
  Filled 2016-07-14 (×3): qty 40

## 2016-07-14 MED ORDER — ACETAMINOPHEN 325 MG PO TABS
650.0000 mg | ORAL_TABLET | ORAL | Status: DC | PRN
  Administered 2016-07-17: 650 mg via ORAL
  Filled 2016-07-14: qty 2

## 2016-07-14 MED ORDER — ACETAMINOPHEN 160 MG/5ML PO SOLN: 650.0000 mg | ORAL | Status: DC | PRN

## 2016-07-14 MED ORDER — SENNOSIDES-DOCUSATE SODIUM 8.6-50 MG PO TABS: 1.0000 | ORAL_TABLET | Freq: Every evening | ORAL | Status: DC | PRN

## 2016-07-14 MED ORDER — ALTEPLASE (STROKE) FULL DOSE INFUSION
0.9000 mg/kg | Freq: Once | INTRAVENOUS | Status: AC
  Administered 2016-07-14: 61.6 mg via INTRAVENOUS
  Filled 2016-07-14: qty 100

## 2016-07-14 MED ORDER — ACETAMINOPHEN 650 MG RE SUPP: 650.0000 mg | RECTAL | Status: DC | PRN

## 2016-07-14 MED ORDER — ORAL CARE MOUTH RINSE
15.0000 mL | Freq: Two times a day (BID) | OROMUCOSAL | Status: DC
  Administered 2016-07-14 – 2016-07-17 (×7): 15 mL via OROMUCOSAL

## 2016-07-14 MED ORDER — ALTEPLASE 100 MG IV SOLR
INTRAVENOUS | Status: AC
Start: 2016-07-14 — End: 2016-07-14
  Filled 2016-07-14: qty 100

## 2016-07-14 MED ORDER — SODIUM CHLORIDE 0.9 % IV SOLN
INTRAVENOUS | Status: DC
  Administered 2016-07-14 – 2016-07-16 (×5): via INTRAVENOUS

## 2016-07-14 MED ORDER — SODIUM CHLORIDE 0.9 % IV SOLN: 50.0000 mL | Freq: Once | INTRAVENOUS | Status: DC

## 2016-07-14 NOTE — Progress Notes (Addendum)
[id number] call time 0834 beeper time 0840 exam started 0845 exam finished  0845 images sent to soc 0854 exam completed in EPIC [phone] [facility] Patient had already been in the ED for 26 minutes before order was placed.

## 2016-07-14 NOTE — ED Provider Notes (Signed)
[facility]-EM[facility] Provider Note   CSN: [id number] Arrival date & time: 07/14/16  [id number]  By signing my name below, I, Freida Busmaniana Omoyeni, attest that this documentation has been prepared under the direction and in the presence of Raeford RazorStephen Vadhir Mcnay, MD . Electronically Signed: Freida Busmaniana Omoyeni, Scribe. 07/14/2016. 8:45 AM.  History   Chief Complaint Chief Complaint  Patient presents with  . Choking    Pt has hx of CVA    LEVEL 5 CAVEAT DUE TO Expressive Aphasia   The history is provided by the nursing home. No language interpreter was used.    HPI Comments:  Kimberly [name] is a 66 y.o. female with a history of CVA, who presents to the Emergency Department via EMS from an assisted living facility following choking episode this morning. Staff noted pt seemed to zone out and her face "looked twisted" following this choking episode. Pt is usually able to converse but has not been speaking since. She was last seen normal ~ 1 hour ago.   Past Medical History:  Diagnosis Date  . Abnormality of gait 08/02/2013  . Anemia   . Arthritis    osteoarthritis  . Blind   . CHF (congestive heart failure)   . COPD (chronic obstructive pulmonary disease)   . DDD (degenerative disc disease)   . Depression   . HOH (hard of hearing)   . Hypothyroidism   . Memory disorder 08/02/2013  . Pneumonia   . Sepsis   . Shortness of breath   . Tobacco abuse 11/02/2013    Patient Active Problem List   Diagnosis Date Noted  . Syncope 11/02/2013  . Syncope and collapse 11/02/2013  . Blind 11/02/2013  . Hypothyroidism 11/02/2013  . History of CVA (cerebrovascular accident) 11/02/2013  . Bradycardia 11/02/2013  . Tobacco abuse 11/02/2013  . Memory disorder 08/02/2013  . Abnormality of gait 08/02/2013  . Chronic diastolic heart failure (HCC) 12/19/2011  . ANEMIA 05/04/2009  . LEUKOCYTOSIS UNSPECIFIED 05/04/2009  . 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 ABDOMINAL PAIN, LOWER 05/04/2009    Past Surgical  History:  Procedure Laterality Date  . ARM DEBRIDEMENT     as child  . CATARACT EXTRACTION W/PHACO  04/28/2011   Procedure: CATARACT EXTRACTION PHACO AND INTRAOCULAR LENS PLACEMENT (IOC);  Surgeon: Susa Simmondsarroll F Haines;  Location: [facility];  Service: Ophthalmology;  Laterality: Left;  CDE: 5.37  . CESAREAN SECTION    . 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 STRABISMUS SURGERY     age 89    OB History    Gravida Para Term Preterm AB Living   2 2 2     2    SAB TAB Ectopic Multiple Live Births                   Home Medications    Prior to Admission medications   Medication Sig Start Date End Date Taking? Authorizing Provider  acetaminophen (TYLENOL) 325 MG tablet Take 650 mg by mouth 3 (three) times daily.      Historical Provider, MD  cholecalciferol (VITAMIN D) 400 UNITS TABS Take 800 Units by mouth daily.      Historical Provider, MD  donepezil (ARICEPT) 5 MG tablet TAKE 1 TABLET BY MOUTH AT BEDTIME. 01/15/15   York Spanielharles K Willis, MD  furosemide (LASIX) 20 MG tablet Take 20 mg by mouth daily.      Historical Provider, MD  levothyroxine (SYNTHROID,  LEVOTHROID) 25 MCG tablet Take 25 mcg by mouth daily.      Historical Provider, MD  LORazepam (ATIVAN) 0.5 MG tablet Take 0.5 mg by mouth 2 (two) times daily.     Historical Provider, MD  memantine (NAMENDA) 10 MG tablet Take 10 mg by mouth 2 (two) times daily.    Historical Provider, MD  mirtazapine (REMERON) 15 MG tablet Take 7.5 mg by mouth at bedtime.    Historical Provider, MD  Multiple Vitamins-Minerals (MULTIVITAMINS THER. W/MINERALS) TABS Take 1 tablet by mouth daily.      Historical Provider, MD  ondansetron (ZOFRAN) 4 MG tablet Take 4 mg by mouth every 6 (six) hours as needed for nausea.  11/04/11   Historical Provider, MD  polyethylene glycol powder (GLYCOLAX/MIRALAX) powder Take 17 g by mouth daily as needed. constipation    Historical Provider,  MD  sertraline (ZOLOFT) 50 MG tablet Take 50 mg by mouth at bedtime.    Historical Provider, MD  simvastatin (ZOCOR) 20 MG tablet Take 20 mg by mouth every evening.    Historical Provider, MD    Family History Family History  Problem Relation Age of Onset  . Colon cancer      neg hx?   . Diabetes Brother   . Diabetes Brother   . Diabetes Mother   . Cancer - Lung Father     Social History Social History  Substance Use Topics  . Smoking status: Former Smoker    Packs/day: 0.50    Types: Cigarettes  . Smokeless tobacco: Never Used  . Alcohol use No     Allergies   Patient has no known allergies.   Review of Systems Review of Systems  Unable to perform ROS: Patient nonverbal     Physical Exam Updated Vital Signs BP 126/77 (BP Location: Left Arm) Comment: Simultaneous filing. User may not have seen previous data. Comment (BP Location): Simultaneous filing. User may not have seen previous data.  Pulse 86 Comment: Simultaneous filing. User may not have seen previous data.  Temp 97.8 F (36.6 C) (Oral) Comment: Simultaneous filing. User may not have seen previous data. Comment (Src): Simultaneous filing. User may not have seen previous data.  Resp 23 Comment: Simultaneous filing. User may not have seen previous data.  Ht 5\' 3"  (1.6 m)   Wt 130 lb (59 kg)   SpO2 96% Comment: Simultaneous filing. User may not have seen previous data.  BMI 23.03 kg/m   Physical Exam  Constitutional: She appears well-developed and well-nourished. No distress.  HENT:  Head: Normocephalic and atraumatic.  Eyes: Conjunctivae are normal.  Neck: Normal range of motion.  Cardiovascular: Normal rate, regular rhythm and normal heart sounds.   Pulmonary/Chest: Effort normal and breath sounds normal.  Abdominal: Soft. She exhibits no distension. There is no tenderness.  Musculoskeletal: Normal range of motion.  Neurological: She is alert.  Flatten of right nasolabial fold  Gaze is  dysconjugate Aphasic Strength is 5/5 to upper and lower extremities Some difficulty with commands; hard of hearing  Cannot assess cerebellar function well secondary to blindness  Skin: Skin is warm and dry.  Nursing note and vitals reviewed.    ED Treatments / Results  DIAGNOSTIC STUDIES:  Oxygen Saturation is 96% on RA, normal by my interpretation.     Labs (all labs ordered are listed, but only abnormal results are displayed) Labs Reviewed  CBC - Abnormal; Notable for the following:       Result Value   Hemoglobin  15.1 (*)    HCT 47.0 (*)    All other components within normal limits  DIFFERENTIAL  ETHANOL  PROTIME-INR  APTT  COMPREHENSIVE METABOLIC PANEL  RAPID URINE DRUG SCREEN, HOSP PERFORMED  URINALYSIS, ROUTINE W REFLEX MICROSCOPIC  I-STAT TROPOININ, ED  CBG MONITORING, ED  I-STAT CHEM 8, ED    EKG  EKG Interpretation None       Radiology Ct Head Wo Contrast  Result Date: 07/14/2016 CLINICAL DATA:  Choking episode.  Face asymmetry. EXAM: CT HEAD WITHOUT CONTRAST TECHNIQUE: Contiguous axial images were obtained from the base of the skull through the vertex without intravenous contrast. COMPARISON:  08/11/2013 FINDINGS: Brain: No evidence of acute infarction, hemorrhage, hydrocephalus, extra-axial collection or mass lesion/mass effect. Remote lacunar infarct in the right thalamus. Cerebral and cerebellar volume loss. Vascular: No hyperdense vessel or unexpected calcification. Skull: Stable and negative Sinuses/Orbits: Left cataract resection Other: These results were called by telephone at the time of interpretation on 07/14/2016 at 8:58 am to Dr. Raeford Razor , who verbally acknowledged these results. ASPECTS [facility] Stroke Program Early CT Score) - Ganglionic level infarction (caudate, lentiform nuclei, internal capsule, insula, M1-M3 cortex): 7 - Supraganglionic infarction (M4-M6 cortex): 3 Total score (0-10 with 10 being normal): 10 IMPRESSION: 1. No acute  finding. ASPECTS is 10. 2. Remote lacunar infarct in the right thalamus. Electronically Signed   By: Marnee Spring M.D.   On: 07/14/2016 08:59    Procedures Procedures (including critical care time)  CRITICAL CARE Performed by: Raeford Razor, MD  Total critical care time: 40 minutes  Critical care time was exclusive of separately billable procedures and treating other patients. Critical care was necessary to treat or prevent imminent or life-threatening deterioration. Critical care was time spent personally by me on the following activities: development of treatment plan with patient and/or surrogate as well as nursing, discussions with consultants, evaluation of patient's response to treatment, examination of patient, obtaining history from patient or surrogate, ordering and performing treatments and interventions, ordering and review of laboratory studies, ordering and review of radiographic studies, pulse oximetry and re-evaluation of patient's condition.  Medications Ordered in ED Medications - No data to display   Initial Impression / Assessment and Plan / ED Course  I have reviewed the triage vital signs and the nursing notes.  Pertinent labs & imaging results that were available during my care of the patient were reviewed by me and considered in my medical decision making (see chart for details).      9:28 AM Discussed case with Tele-neurology. Recommended TPA, unfortunately given pt's deficits, cannot directly obtain consent. Pt's facility and listed contact Janeal Holmes) were contacted (voicemail left) but unable to quickly reach anyone to obtain consent. Given the degree of her deficits, no obvious contraindications and neurology recommendations, tpa given.  9:40 AM Discussed with neurology at [facility]. Transfer to neuro ICU.   Final Clinical Impressions(s) / ED Diagnoses   Final diagnoses:  Ischemic stroke [facility])    New Prescriptions New Prescriptions   No medications  on file   I personally preformed the services scribed in my presence. The recorded information has been reviewed is accurate. Raeford Razor, MD.     Raeford Razor, MD 07/14/16 [phone]

## 2016-07-14 NOTE — ED Notes (Signed)
Beeped out CODE STROKE 

## 2016-07-14 NOTE — ED Triage Notes (Signed)
Pt was eating breakfast this am at assisted living dining hall when staff noticed her choking on coffee. EMS report pt is at baseline with initial equal bilateral strength to arms and legs.  Ems says she is soft spoken and rarely speaks.

## 2016-07-14 NOTE — Progress Notes (Signed)
*  PRELIMINARY RESULTS* Vascular Ultrasound Carotid Duplex has been completed.  Preliminary findings: Bilateral: No significant (1-39%) ICA stenosis. Antegrade vertebral flow.    Farrel DemarkJill Eunice, RDMS, RVT  07/14/2016, 3:56 PM

## 2016-07-14 NOTE — H&P (Signed)
Requesting Physician: EDP    Chief Complaint: Expressive aphasia, choking   History obtained from:  Patient and chart  HPI:                                                                                                                                         Kimberly [name] is an 66 y.o. woman with PMHx of prior CVA, grade 1 diastolic CHF, hypothyroidism, depression, and blindness who presented to the ED today after found to be choking on coffee at her assisted living facility. Per ED notes, facility staff reported the patient was "zoning out" and her face look twisted after the choking episode. Patient reportedly able to speak normally but has not been speaking since the episode. She was last seen normal around 7:10AM.   She has difficulty speaking but is able to say short words such as "yes" or "no" or "yes, ma'am." She answers questions appropriately nodding/shaking her head. She follows all commands. Denies any weakness in her arms/legs.  Date last known well: 07/14/16 Time last known well: 7:10AM tPA Given: Yes No Symptoms         0 No significant disability/able to carry out all usual activities   1 Unable to carry out all previous activities but looks after own affairs             2 Requires help but walks without assistance     3 Unable to walk without assistance/unable to handle own bodily needs 4 Bedridden/incontinent        5 Dead          6  Modified Rankin: Rankin Score=4   Past Medical History:  Diagnosis Date  . Abnormality of gait 08/02/2013  . Anemia   . Arthritis    osteoarthritis  . Blind   . CHF (congestive heart failure) (HCC)   . COPD (chronic obstructive pulmonary disease) (HCC)   . DDD (degenerative disc disease)   . Depression   . HOH (hard of hearing)   . Hypothyroidism   . Memory disorder 08/02/2013  . Pneumonia   . Sepsis (HCC)   . Shortness of breath   . Tobacco abuse 11/02/2013    Past Surgical History:  Procedure Laterality Date  . ARM  DEBRIDEMENT     as child  . CATARACT EXTRACTION W/PHACO  04/28/2011   Procedure: CATARACT EXTRACTION PHACO AND INTRAOCULAR LENS PLACEMENT (IOC);  Surgeon: Susa Simmonds;  Location: [facility];  Service: Ophthalmology;  Laterality: Left;  CDE: 5.37  . CESAREAN SECTION    . 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 STRABISMUS SURGERY     age 9    Family History  Problem Relation Age of Onset  . Diabetes Brother   . Diabetes Brother   . Diabetes Mother   .  Cancer - Lung Father   . Colon cancer      neg hx?    Social History:  reports that she has quit smoking. Her smoking use included Cigarettes. She smoked 0.50 packs per day. She has never used smokeless tobacco. She reports that she does not drink alcohol or use drugs.  Allergies: No Known Allergies  Medications:                                                                                                                           I have reviewed the patient's current medications.  ROS:                                                                                                                                       History obtained from unobtainable from patient due to aphasia    Neurologic Examination:                                                                                                      Blood pressure (!) 123/110, pulse 83, temperature 97.8 F (36.6 C), resp. rate (!) 30, height 5\' 1"  (1.549 m), weight 135 lb 9.3 oz (61.5 kg), SpO2 100 %.  HEENT-  [place]/AT, EOMI, mucus membranes moist Cardiovascular- RRR, no m/g/r Lungs- CTA bilaterally, breaths non-labored  Abdomen- BS+, soft, non-tender  Extremities- no peripheral edema  Lymph-no adenopathy palpable Musculoskeletal-no joint tenderness, deformity or swelling, full range of motion without pain Skin-warm and dry, no hyperpigmentation, vitiligo, or suspicious  lesions  Neurological Examination Mental Status: Alert. Aphasia present- able to say 1 word answers to some questions. Speech slurred.  Cranial Nerves: II: Unable to assess due to blindness III,IV, VI: ptosis not present, extra-ocular motions intact bilaterally, PERRL V,VII: mild right facial droop, facial light touch sensation normal bilaterally VIII: hearing decreased bilaterally  IX,X: uvula rises symmetrically XI: bilateral shoulder shrug XII: midline tongue extension Motor: Right : Upper extremity  5/5    Left:     Upper extremity   5/5  Lower extremity   5/5     Lower extremity   5/5 Tone and bulk:normal tone throughout; no atrophy noted Sensory: Light touch intact throughout, bilaterally Deep Tendon Reflexes: 2+ and symmetric throughout Plantars: Right: downgoing   Left: downgoing Cerebellar: Unable to assess finger to nose due to blindness, normal heel-to-shin test Gait: Unable to assess due to blindness    Lab Results: Basic Metabolic Panel:  Recent Labs Lab 07/14/16 0927  NA 140  K 3.8  CL 101  CO2 30  GLUCOSE 102*  BUN 16  CREATININE 0.85  CALCIUM 9.8    Liver Function Tests:  Recent Labs Lab 07/14/16 0927  AST 25  ALT 19  ALKPHOS 70  BILITOT 0.5  PROT 7.9  ALBUMIN 4.4    CBC:  Recent Labs Lab 07/14/16 0927  WBC 7.4  NEUTROABS 4.7  HGB 15.1*  HCT 47.0*  MCV 93.3  PLT 317   CBG:  Recent Labs Lab 07/14/16 0853  GLUCAP 82    Coagulation Studies:  Recent Labs  07/14/16 0927  LABPROT 12.3  INR 0.92    Imaging: Ct Head Wo Contrast  Result Date: 07/14/2016 CLINICAL DATA:  Choking episode.  Face asymmetry. EXAM: CT HEAD WITHOUT CONTRAST TECHNIQUE: Contiguous axial images were obtained from the base of the skull through the vertex without intravenous contrast. COMPARISON:  08/11/2013 FINDINGS: Brain: No evidence of acute infarction, hemorrhage, hydrocephalus, extra-axial collection or mass lesion/mass effect. Remote lacunar  infarct in the right thalamus. Cerebral and cerebellar volume loss. Vascular: No hyperdense vessel or unexpected calcification. Skull: Stable and negative Sinuses/Orbits: Left cataract resection Other: These results were called by telephone at the time of interpretation on 07/14/2016 at 8:58 am to Dr. Raeford RazorSTEPHEN KOHUT , who verbally acknowledged these results. ASPECTS [facility]([place] Stroke Program Early CT Score) - Ganglionic level infarction (caudate, lentiform nuclei, internal capsule, insula, M1-M3 cortex): 7 - Supraganglionic infarction (M4-M6 cortex): 3 Total score (0-10 with 10 being normal): 10 IMPRESSION: 1. No acute finding. ASPECTS is 10. 2. Remote lacunar infarct in the right thalamus. Electronically Signed   By: Marnee SpringJonathon  Watts M.D.   On: 07/14/2016 08:59     Assessment: 66 y.o. woman with prior CVA and hyperlipidemia presenting after choking episode this morning and new onset aphasia concerning for acute CVA. CT head showing remote right thalamic infarct, but no acute findings. She presented within the time window and received tPA.   Stroke Risk Factors - hyperlipidemia  New Onset Aphasia and Difficulty Swallowing: Symptoms concerning for acute CVA given the sudden onset. She did receive tPA therapy and is tolerating it well. She is still having expressive aphasia. Will complete stroke work up and monitor post-tPA. - MRI brain without contrast - MRA head/neck - Hemoglobin A1c/lipid panel - Echocardiogram - PT/OT/SLP - Neuro checks - Bleeding and fall precautions - SCDs  Attending note to follow  Rich Numberarly Rivet, MD, MPH Internal Medicine Resident, PGY-III Pager: [phone]

## 2016-07-14 NOTE — ED Notes (Signed)
TPA started. Delay due to family returning call and speaking with neurologist.

## 2016-07-14 NOTE — ED Notes (Signed)
Neurologist spoke with family member and they approved of giving activase

## 2016-07-14 NOTE — ED Notes (Signed)
Kimberly [name] [phone], pt brother called to check on pt status.

## 2016-07-14 NOTE — ED Notes (Signed)
Called [facility] multiple times.  Placed on hold.  Transferred to Tashia who stated Pt was last seen normal/baseline at 0710 and next seen choking on coffee, "twisted mouth" and unable to talk, "zoned out".  Notified Dr. Juleen ChinaKohut EDP.

## 2016-07-14 NOTE — ED Notes (Signed)
Attempted to contact pt family listed Senaida OresJames Dozier, non working number.

## 2016-07-14 NOTE — ED Notes (Signed)
Called SOC. 

## 2016-07-14 NOTE — ED Notes (Signed)
Called CT.  

## 2016-07-14 NOTE — ED Notes (Signed)
Tele Neuro in progress 

## 2016-07-14 NOTE — ED Notes (Signed)
I stat chem 8 cartridges unavailable.

## 2016-07-14 NOTE — Evaluation (Signed)
Clinical/Bedside Swallow Evaluation Patient Details  Name: Kimberly [name] MRN: [id number] Date of Birth: 1950-10-10  Today's Date: 07/14/2016 Time: SLP Start Time (ACUTE ONLY): 1100 SLP Stop Time (ACUTE ONLY): 1125 SLP Time Calculation (min) (ACUTE ONLY): 25 min  Past Medical History:  Past Medical History:  Diagnosis Date  . Abnormality of gait 08/02/2013  . Anemia   . Arthritis    osteoarthritis  . Blind   . CHF (congestive heart failure) (HCC)   . COPD (chronic obstructive pulmonary disease) (HCC)   . DDD (degenerative disc disease)   . Depression   . HOH (hard of hearing)   . Hypothyroidism   . Memory disorder 08/02/2013  . Pneumonia   . Sepsis (HCC)   . Shortness of breath   . Tobacco abuse 11/02/2013   Past Surgical History:  Past Surgical History:  Procedure Laterality Date  . ARM DEBRIDEMENT     as child  . CATARACT EXTRACTION W/PHACO  04/28/2011   Procedure: CATARACT EXTRACTION PHACO AND INTRAOCULAR LENS PLACEMENT (IOC);  Surgeon: Susa Simmonds;  Location: [facility];  Service: Ophthalmology;  Laterality: Left;  CDE: 5.37  . CESAREAN SECTION    . 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 STRABISMUS SURGERY     age 37   HPI:  Kimberly Wuebker Collinsis a 66 y.o.femalewith a history of CVA, who presents to the Emergency Departmentvia EMS from an assisted living facility following choking episode this morning. Staff noted pt seemed to zone out and her face "looked twisted" following this choking episode. Pt is usually able to converse but has not been speaking since. Head CT negative.    Assessment / Plan / Recommendation Clinical Impression  Patient presents with a moderate oropharyngeal dysphagia, exacerbated by altered mentation and lethargy.  Patient with right sided oral phase deficits, when combined with lethargy, decreased attention, and awareness, results in oral holding of bolus,  prolonged oral transit, and oral residuals, often requiring cueing for oral transit of bolus and initiation of swallow increasing aspiration risk. Recommend NPO except meds crushed in puree. Potential to advance good. Will f/u.  SLP Visit Diagnosis: Dysphagia, oropharyngeal phase (R13.12)    Aspiration Risk  Moderate aspiration risk;Severe aspiration risk    Diet Recommendation NPO except meds   Medication Administration: Crushed with puree    Other  Recommendations Oral Care Recommendations: Oral care QID   Follow up Recommendations Skilled Nursing facility      Frequency and Duration min 2x/week  2 weeks       Prognosis Prognosis for Safe Diet Advancement: Good      Swallow Study   General HPI: Kimberly Zingaro Collinsis a 66 y.o.femalewith a history of CVA, who presents to the Emergency Departmentvia EMS from an assisted living facility following choking episode this morning. Staff noted pt seemed to zone out and her face "looked twisted" following this choking episode. Pt is usually able to converse but has not been speaking since. Head CT negative.  Type of Study: Bedside Swallow Evaluation Previous Swallow Assessment: none Diet Prior to this Study: NPO Temperature Spikes Noted: No Respiratory Status: Room air History of Recent Intubation: No Behavior/Cognition: Lethargic/Drowsy;Cooperative;Pleasant mood;Requires cueing Oral Cavity Assessment: Within Functional Limits Oral Care Completed by SLP: Recent completion by staff Oral Cavity - Dentition: Adequate natural dentition Vision: Impaired for self-feeding Self-Feeding Abilities: Needs assist Patient Positioning: Upright in bed Baseline Vocal Quality: Low vocal intensity  Volitional Cough: Cognitively unable to elicit Volitional Swallow: Unable to elicit    Oral/Motor/Sensory Function Overall Oral Motor/Sensory Function: Moderate impairment Facial ROM: Reduced right;Suspected CN VII (facial) dysfunction Facial Symmetry:  Abnormal symmetry right;Suspected CN VII (facial) dysfunction Facial Strength: Reduced right;Suspected CN VII (facial) dysfunction Facial Sensation: Within Functional Limits Lingual ROM: Reduced right;Reduced left Lingual Symmetry: Within Functional Limits Lingual Strength: Reduced Lingual Sensation: Within Functional Limits Velum: Within Functional Limits Mandible: Within Functional Limits   Ice Chips Ice chips: Not tested   Thin Liquid Thin Liquid: Impaired Presentation: Cup;Self Fed;Straw Oral Phase Impairments: Impaired mastication Oral Phase Functional Implications: Prolonged oral transit;Oral holding (unable to suck through straw) Pharyngeal  Phase Impairments: Suspected delayed Swallow    Nectar Thick Nectar Thick Liquid: Not tested   Honey Thick Honey Thick Liquid: Not tested   Puree Puree: Impaired Presentation: Spoon Oral Phase Impairments: Impaired mastication Oral Phase Functional Implications: Prolonged oral transit;Oral holding   Solid   GO   Solid: Impaired Presentation: Spoon Oral Phase Impairments: Impaired mastication;Poor awareness of bolus;Reduced lingual movement/coordination Oral Phase Functional Implications: Impaired mastication;Prolonged oral transit;Oral residue;Oral holding       Ferdinand LangoLeah Raheem Kolbe MA, CCC-SLP [phone]  Jatavis Malek Meryl 07/14/2016,1:57 PM

## 2016-07-15 ENCOUNTER — Inpatient Hospital Stay (HOSPITAL_COMMUNITY): Payer: Medicare Other

## 2016-07-15 ENCOUNTER — Encounter (HOSPITAL_COMMUNITY): Payer: Self-pay | Admitting: *Deleted

## 2016-07-15 DIAGNOSIS — G8191 Hemiplegia, unspecified affecting right dominant side: Secondary | ICD-10-CM | POA: Diagnosis not present

## 2016-07-15 DIAGNOSIS — I639 Cerebral infarction, unspecified: Secondary | ICD-10-CM | POA: Diagnosis not present

## 2016-07-15 DIAGNOSIS — I6340 Cerebral infarction due to embolism of unspecified cerebral artery: Secondary | ICD-10-CM | POA: Diagnosis not present

## 2016-07-15 DIAGNOSIS — I6789 Other cerebrovascular disease: Secondary | ICD-10-CM | POA: Diagnosis not present

## 2016-07-15 LAB — GLUCOSE, CAPILLARY
GLUCOSE-CAPILLARY: 71 mg/dL (ref 65–99)
Glucose-Capillary: 82 mg/dL (ref 65–99)

## 2016-07-15 LAB — ECHOCARDIOGRAM COMPLETE
HEIGHTINCHES: 61 in
Weight: 2169.33 oz

## 2016-07-15 LAB — LIPID PANEL
CHOL/HDL RATIO: 2.2 ratio
Cholesterol: 123 mg/dL (ref 0–200)
HDL: 55 mg/dL (ref >40)
LDL Cholesterol: 48 mg/dL (ref 0–99)
TRIGLYCERIDES: 102 mg/dL (ref <150)
VLDL: 20 mg/dL (ref 0–40)

## 2016-07-15 MED ORDER — ARIPIPRAZOLE 2 MG PO TABS
2.0000 mg | ORAL_TABLET | Freq: Every day | ORAL | Status: DC
  Administered 2016-07-15 – 2016-07-17 (×3): 2 mg via ORAL
  Filled 2016-07-15 (×5): qty 1

## 2016-07-15 MED ORDER — SERTRALINE HCL 50 MG PO TABS
75.0000 mg | ORAL_TABLET | Freq: Every day | ORAL | Status: DC
  Administered 2016-07-15 – 2016-07-17 (×3): 75 mg via ORAL
  Filled 2016-07-15 (×3): qty 2

## 2016-07-15 MED ORDER — MEMANTINE HCL 10 MG PO TABS
10.0000 mg | ORAL_TABLET | Freq: Two times a day (BID) | ORAL | Status: DC
  Administered 2016-07-15 – 2016-07-18 (×7): 10 mg via ORAL
  Filled 2016-07-15 (×8): qty 1

## 2016-07-15 MED ORDER — DONEPEZIL HCL 5 MG PO TABS
5.0000 mg | ORAL_TABLET | Freq: Every day | ORAL | Status: DC
  Administered 2016-07-15 – 2016-07-17 (×3): 5 mg via ORAL
  Filled 2016-07-15 (×3): qty 1

## 2016-07-15 MED ORDER — ASPIRIN EC 325 MG PO TBEC
325.0000 mg | DELAYED_RELEASE_TABLET | Freq: Every day | ORAL | Status: DC
  Administered 2016-07-15 – 2016-07-18 (×4): 325 mg via ORAL
  Filled 2016-07-15 (×4): qty 1

## 2016-07-15 MED ORDER — MIRTAZAPINE 15 MG PO TABS
7.5000 mg | ORAL_TABLET | Freq: Every day | ORAL | Status: DC
  Administered 2016-07-15 – 2016-07-17 (×3): 7.5 mg via ORAL
  Filled 2016-07-15 (×3): qty 1

## 2016-07-15 MED ORDER — ADULT MULTIVITAMIN W/MINERALS CH
1.0000 | ORAL_TABLET | Freq: Every day | ORAL | Status: DC
  Administered 2016-07-15 – 2016-07-18 (×4): 1 via ORAL
  Filled 2016-07-15 (×4): qty 1

## 2016-07-15 MED ORDER — LEVOTHYROXINE SODIUM 25 MCG PO TABS
25.0000 ug | ORAL_TABLET | Freq: Every day | ORAL | Status: DC
  Administered 2016-07-16 – 2016-07-18 (×3): 25 ug via ORAL
  Filled 2016-07-15 (×3): qty 1

## 2016-07-15 MED ORDER — SIMVASTATIN 20 MG PO TABS
20.0000 mg | ORAL_TABLET | Freq: Every day | ORAL | Status: DC
  Administered 2016-07-15 – 2016-07-17 (×3): 20 mg via ORAL
  Filled 2016-07-15 (×3): qty 1

## 2016-07-15 NOTE — Evaluation (Signed)
Speech Language Pathology Evaluation Patient Details Name: Kimberly [name] MRN: [id number] DOB: [birth year] Today's Date: 07/15/2016 Time: 9562-1308 SLP Time Calculation (min) (ACUTE ONLY): 9 min  Problem List:  Patient Active Problem List   Diagnosis Date Noted  . Ischemic stroke (HCC) 07/14/2016  . Acute right hemiparesis (HCC)   . Dysarthria   . Dementia without behavioral disturbance   . Syncope 11/02/2013  . Syncope and collapse 11/02/2013  . Blind 11/02/2013  . Hypothyroidism 11/02/2013  . History of CVA (cerebrovascular accident) 11/02/2013  . Bradycardia 11/02/2013  . Tobacco abuse 11/02/2013  . Memory disorder 08/02/2013  . Abnormality of gait 08/02/2013  . Chronic diastolic heart failure (HCC) 12/19/2011  . ANEMIA 05/04/2009  . LEUKOCYTOSIS UNSPECIFIED 05/04/2009  . 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 ABDOMINAL PAIN, LOWER 05/04/2009   Past Medical History:  Past Medical History:  Diagnosis Date  . Abnormality of gait 08/02/2013  . Anemia   . Arthritis    osteoarthritis  . Blind   . CHF (congestive heart failure) (HCC)   . COPD (chronic obstructive pulmonary disease) (HCC)   . DDD (degenerative disc disease)   . Depression   . HOH (hard of hearing)   . Hypothyroidism   . Memory disorder 08/02/2013  . Pneumonia   . Sepsis (HCC)   . Shortness of breath   . Tobacco abuse 11/02/2013   Past Surgical History:  Past Surgical History:  Procedure Laterality Date  . ARM DEBRIDEMENT     as child  . CATARACT EXTRACTION W/PHACO  04/28/2011   Procedure: CATARACT EXTRACTION PHACO AND INTRAOCULAR LENS PLACEMENT (IOC);  Surgeon: Susa Simmonds;  Location: [facility];  Service: Ophthalmology;  Laterality: Left;  CDE: 5.37  . CESAREAN SECTION    . 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 STRABISMUS SURGERY     age 22   HPI:  Kimberly Poss Collinsis a 66 y.o.femalewith a history of  CVA, who presents to the Emergency Departmentvia EMS from an assisted living facility following choking episode this morning. Staff noted pt seemed to zone out and her face "looked twisted" following this choking episode. Pt is usually able to converse but has not been speaking since. Head CT negative. MRI pending.   Assessment / Plan / Recommendation Clinical Impression  Pt has mild right-sided weakness with resultant mild dysarthria as well as anomia in spontaneous speech. She needed Max cues for accuracy with responsive naming questions, but she does follow simple commands well. Suspect a primarily expressive aphasia. Cognitively she is oriented to person only with reduced intellectual awareness and basic problem solving, but it is unclear how she compares to her cognitive baseline. Pt will need SLP f/u to maximize functional communication and cognition.     SLP Assessment  SLP Recommendation/Assessment: Patient needs continued Speech Lanaguage Pathology Services SLP Visit Diagnosis: Cognitive communication deficit (R41.841);Aphasia (R47.01)    Follow Up Recommendations  Skilled Nursing facility    Frequency and Duration min 2x/week  2 weeks      SLP Evaluation Cognition  Overall Cognitive Status: No family/caregiver present to determine baseline cognitive functioning Arousal/Alertness: Awake/alert Orientation Level: Oriented to person;Disoriented to time;Disoriented to situation;Disoriented to place Attention: Sustained Sustained Attention: Appears intact Memory: Impaired Memory Impairment: Decreased recall of new information Awareness: Impaired Awareness Impairment: Intellectual impairment;Emergent impairment Problem Solving: Impaired Problem Solving Impairment: Verbal basic       Comprehension  Auditory Comprehension Overall  Auditory Comprehension: Impaired Yes/No Questions: Impaired Basic Biographical Questions: 76-100% accurate Complex Questions: 50-74%  accurate Commands: Within Functional Limits Conversation: Simple Interfering Components: Hearing    Expression Expression Primary Mode of Expression: Verbal Verbal Expression Overall Verbal Expression: Impaired Initiation: No impairment Automatic Speech: Name;Social Response Level of Generative/Spontaneous Verbalization: Sentence Naming: Impairment Responsive: 0-25% accurate Non-Verbal Means of Communication: Not applicable   Oral / Motor  Oral Motor/Sensory Function Overall Oral Motor/Sensory Function: Mild impairment Facial ROM: Reduced right;Suspected CN VII (facial) dysfunction Facial Symmetry: Abnormal symmetry right;Suspected CN VII (facial) dysfunction Facial Strength: Reduced right;Suspected CN VII (facial) dysfunction Facial Sensation: Within Functional Limits Lingual ROM: Reduced right;Reduced left Lingual Symmetry: Within Functional Limits Lingual Strength: Reduced Lingual Sensation: Within Functional Limits Velum: Within Functional Limits Mandible: Within Functional Limits Motor Speech Overall Motor Speech: Impaired Respiration: Within functional limits Phonation: Normal Resonance: Within functional limits Articulation: Impaired Level of Impairment: Sentence Intelligibility: Intelligibility reduced Sentence: 75-100% accurate Motor Speech Errors: Not applicable   GO                    [name], [name] 07/15/2016, 10:18 AM  Maxcine [name], M.A. CCC-SLP [phone]

## 2016-07-15 NOTE — Progress Notes (Addendum)
STROKE TEAM PROGRESS NOTE   SUBJECTIVE (INTERVAL HISTORY) Her nurse and tech are at the bedside.  Foley placed for urinary retention.   OBJECTIVE Temp:  [97.8 F (36.6 C)-99.1 F (37.3 C)] 98.3 F (36.8 C) (02/20 0400) Pulse Rate:  [68-98] 89 (02/20 0700) Cardiac Rhythm: Normal sinus rhythm (02/20 0800) Resp:  [14-30] 24 (02/20 0800) BP: (77-139)/(45-116) 118/70 (02/20 0800) SpO2:  [92 %-100 %] 95 % (02/20 0800) Weight:  [61.5 kg (135 lb 9.3 oz)-68.5 kg (151 lb)] 61.5 kg (135 lb 9.3 oz) (02/19 1100)  CBC:  Recent Labs Lab 07/14/16 0927  WBC 7.4  NEUTROABS 4.7  HGB 15.1*  HCT 47.0*  MCV 93.3  PLT 317    Basic Metabolic Panel:  Recent Labs Lab 07/14/16 0927  NA 140  K 3.8  CL 101  CO2 30  GLUCOSE 102*  BUN 16  CREATININE 0.85  CALCIUM 9.8    HgbA1c: No results found for: HGBA1C    PHYSICAL EXAM Pleasant middle aged lady not in distress. . Afebrile. Head is nontraumatic. Neck is supple without bruit.    Cardiac exam no murmur or gallop. Lungs are clear to auscultation. Distal pulses are well felt. Neurological Exam ;  Awake alert oriented 3. Speech is slightly nonfluent with occasional word finding difficulties. No paraphasic errors. Able to name repeat and comprehend quite well. Extraocular movements are full range without nystagmus. Fundi were not visualized. Blinks to threat bilaterally. Face is asymmetric with mild right lower face weakness. Tongue is midline. Motor system exam reveals symmetric upper and lower extremity strength without focal weakness.Diminished fine finger movements on the right. Orbits left over right upper extremity. Deep tendon flexes are symmetric. Plantars are downgoing. Gait was not tested. ASSESSMENT/PLAN Ms. KAVYA HAAG is a 66 y.o. female with history of prior stroke, grade 1 diastolic CHF, hypothyroidism, depression and blindness presenting with expressive aphasia. She received IV t-PA 07/14/2016 at 0938.   Stroke:  left insular  infarct s/p IV tPA embolic secondary to unknown source. Suspicious for atrial fibrillation given age.  CT head no acute abnormality. ASPECTS 10  MRI  Small L insular infarct. Old R thalamic infarct. Chronic cyctic pituitary lesion unchanged since 2015  MRA  Normal.   Carotid Doppler  Bilat 1-39% ICA No significant stenosis   2D Echo  pending   LDL 48  HgbA1c pending  SCDs for VTE prophylaxis DIET DYS 2 Room service appropriate? Yes; Fluid consistency: Thin  No antithrombotic prior to admission, now on No antithrombotic as within 24 post tPA. Add aspirin as post tPA MRI negative for hemorrhage  No further embolic workup as not a good anticoagulation candidate d/t memory loss  Therapy recommendations:  Pending. Ok to be OOB  Disposition:  pending (from [facility])  Transfer to the floor  Anticipate discharge in am. Location based on therapy recommendations - SNF vs return to ALF with [facility]  Hypertension  Stable  Long-term BP goal normotensive  Hyperlipidemia  Home meds:  zocor 20, will resume  LDL at goal  Continue statin at discharge  Other Stroke Risk Factors  Advanced age  UDS postive for Benzos  Former Cigarette smoker  Hx stroke/TIA  Old R thalamic lacunar infarct  Hx diastolic CHF   Other Active Problems  Memory loss on donepezil and memantine  Urinary retention with foley placement. Remove and attempt voiding.  Hospital day # 1  Rhoderick Moody [facility] See Amion for Pager information 07/15/2016 9:22  AM  I have personally examined this patient, reviewed notes, independently viewed imaging studies, participated in medical decision making and plan of care.ROS completed by me personally and pertinent positives fully documented  I have made any additions or clarifications directly to the above note. Agree with note above. She presented with aphasia secondary to left hemispheric embolic infarct etiology to be determined. She  received IV tPA uneventfully and has shown significant neurological improvement. Continue close neurological monitoring and blood pressure control as per post TPA protocol. Continue ongoing stroke workup. This patient is critically ill and at significant risk of neurological worsening, death and care requires constant monitoring of vital signs, hemodynamics,respiratory and cardiac monitoring, extensive review of multiple databases, frequent neurological assessment, discussion with family, other specialists and medical decision making of high complexity.I have made any additions or clarifications directly to the above note.This critical care time does not reflect procedure time, or teaching time or supervisory time of PA/NP/Med Resident etc but could involve care discussion time.  I spent 30 minutes of neurocritical care time  in the care of  this patient.      Delia HeadyPramod Gilmar Bua, MD Medical Director [facility] Pager: [phone] 07/15/2016 2:48 PM  To contact Stroke Continuity provider, please refer to [email or web address]. After hours, contact General Neurology

## 2016-07-15 NOTE — Progress Notes (Signed)
  Echocardiogram 2D Echocardiogram has been performed.  Kimberly [name] 07/15/2016, 11:34 AM

## 2016-07-15 NOTE — Evaluation (Signed)
Occupational Therapy Evaluation Patient Details Name: Kimberly [name] MRN: [id number] DOB: 11-Sep-1950 Today's Date: 07/15/2016    History of Present Illness This 66 y.o. female [facility] after choking on her coffee, twisted mouth, and unable to talk.  She was administered tPA.  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  PMH includes:  COPD, Blind, abnormality of gait, memory disorder, HOH, CHF.  [facility]   Clinical Impression   Pt admitted with above. She demonstrates the below listed deficits and will benefit from continued OT to maximize safety and independence with BADLs.  Pt presents to OT with generalized weakness, impaired balance, h/o cognitive deficits, h/o blindness, and HOH.  She lived at ALF PTA, and is unable to provide accurate info re: PLOF.  Currently, she requires min - mod A for ADLs - her visual deficits and HOH coupled with unfamiliar environment hinder her performance.  Hopefully, she will be able to return to ALF with assist and receive HHOT.  IF ALF unable to provide necessary level of assist, then she may need SNF.       Follow Up Recommendations  Home health OT;Supervision/Assistance - 24 hour;return to ALF vs SNf if they are unable to provide assist     Equipment Recommendations  3 in 1 bedside commode    Recommendations for Other Services       Precautions / Restrictions Precautions Precautions: Fall Restrictions Weight Bearing Restrictions: No      Mobility Bed Mobility Overal bed mobility: Needs Assistance Bed Mobility: Supine to Sit     Supine to sit: Mod assist;HOB elevated     General bed mobility comments: sitting in chair   Transfers Overall transfer level: Needs assistance Equipment used: Rolling walker (2 wheeled) Transfers: Sit to/from [employer] CorporationStand;Stand Pivot  Transfers Sit to Stand: Min assist Stand pivot transfers: Min assist       General transfer comment: assist to move into standing, and assist to guide walker and hips toward surfaces.  Visual deficits limit her ability to perform more independently     Balance Overall balance assessment: Needs assistance Sitting-balance support: Feet supported Sitting balance-Leahy Scale: Good Sitting balance - Comments: Pt able to maintain seated balance EOB, requiring verbal cues for upright posture.    Standing balance support: Bilateral upper extremity supported;During functional activity Standing balance-Leahy Scale: Fair                              ADL Overall ADL's : Needs assistance/impaired Eating/Feeding: Minimal assistance;Sitting   Grooming: Wash/dry face;Wash/dry hands;Brushing hair;Set up;Supervision/safety;Sitting   Upper Body Bathing: Moderate assistance;Sitting   Lower Body Bathing: Moderate assistance;Sit to/from stand   Upper Body Dressing : Maximal assistance;Sitting   Lower Body Dressing: Moderate assistance;Sit to/from stand Lower Body Dressing Details (indicate cue type and reason): once socks started over toes, she is able to pull them over feet  Toilet Transfer: Minimal assistance;Squat-pivot;BSC;RW Toilet Transfer Details (indicate cue type and reason): Min A to move sit to stand and to guide pt toward surface/BSC  Toileting- Clothing Manipulation and Hygiene: Moderate assistance;Sit to/from stand       Functional mobility during ADLs: Minimal assistance;Rolling walker General ADL Comments: Pt limited by visual deficits and unfamiliar environment      Vision Baseline Vision/History: Legally blind Patient Visual Report: No  change from baseline Additional Comments: Pt with h/o blindness.  Pt does not appear to see objects      Perception Perception Perception Tested?: Yes   Praxis Praxis Praxis tested?: Within functional limits    Pertinent  Vitals/Pain Pain Assessment: Faces Faces Pain Scale: Hurts a little bit     Hand Dominance Right   Extremity/Trunk Assessment Upper Extremity Assessment Upper Extremity Assessment: Generalized weakness   Lower Extremity Assessment Lower Extremity Assessment: Defer to PT evaluation   Cervical / Trunk Assessment Cervical / Trunk Assessment: Kyphotic   Communication Communication Communication: HOH   Cognition Arousal/Alertness: Awake/alert Behavior During Therapy: WFL for tasks assessed/performed Overall Cognitive Status: No family/caregiver present to determine baseline cognitive functioning                 General Comments: Able to follow simple one-step commands with increased time. Required multiple verbal and tactile cues for initiation and sequencing.    General Comments       Exercises       Shoulder Instructions      Home Living Family/patient expects to be discharged to:: Assisted living     Type of Home: Assisted living                       Home Equipment: Walker - 2 wheels          Prior Functioning/Environment Level of Independence: Needs assistance  Gait / Transfers Assistance Needed: Pt reports she amb with RW. Pt poor historian and unable to answer further history questions appropriately.  ADL's / Homemaking Assistance Needed: Pt indicates she requires assist with ADLs.  This is consistent with office noted from 2015             OT Problem List: Decreased strength;Decreased activity tolerance;Impaired balance (sitting and/or standing);Decreased cognition;Decreased safety awareness;Decreased knowledge of use of DME or AE      OT Treatment/Interventions: Self-care/ADL training;DME and/or AE instruction;Therapeutic activities;Cognitive remediation/compensation;Visual/perceptual remediation/compensation;Patient/family education;Balance training    OT Goals(Current goals can be found in the care plan section) Acute Rehab OT  Goals Patient Stated Goal: to go home  OT Goal Formulation: With patient Time For Goal Achievement: 07/29/16 Potential to Achieve Goals: Good ADL Goals Pt Will Perform Upper Body Bathing: with supervision;sitting;with set-up Pt Will Perform Lower Body Bathing: with min assist;sit to/from stand Pt Will Transfer to Toilet: with min assist;ambulating;regular height toilet;grab bars Pt Will Perform Toileting - Clothing Manipulation and hygiene: with min assist;sit to/from stand  OT Frequency: Min 2X/week   Barriers to D/C:            Co-evaluation              End of Session Equipment Utilized During Treatment: Engineer, water Communication: Mobility status  Activity Tolerance: Patient tolerated treatment well Patient left: in chair;with call bell/phone within reach;with chair alarm set;with nursing/sitter in room  OT Visit Diagnosis: Unsteadiness on feet (R26.81)                ADL either performed or assessed with clinical judgement  Time: 1215-1236 OT Time Calculation (min): 21 min Charges:  OT General Charges $OT Visit: 1 Procedure OT Evaluation $OT Eval Moderate Complexity: 1 Procedure G-Codes:     Jeani Hawking, OTR/L [phone]   Joedy Eickhoff M 07/15/2016, 1:10 PM

## 2016-07-15 NOTE — Progress Notes (Signed)
PT Cancellation Note  Patient Details Name: Kimberly [name] MRN: [id number] DOB: [birth year]   Cancelled Treatment:    Reason Eval/Treat Not Completed: Patient not medically ready Pt on bedrest. Will await increase in activity orders prior to PT evaluation.    Blake DivineShauna A Veronnica Hennings 07/15/2016, 9:35 AM Mylo RedShauna Leary Mcnulty, PT, DPT [phone]

## 2016-07-15 NOTE — Progress Notes (Signed)
Foley was removed at 1000. Bladder scanner revealed 120mL at 1445. Patient is not distended or uncomfortable. Will continue to monitor. Attie Nawabi, [place] ScrapeSarah E, RN

## 2016-07-15 NOTE — NC FL2 (Addendum)
[place] MEDICAID FL2 LEVEL OF CARE SCREENING TOOL     IDENTIFICATION  Patient Name: Kimberly [name] Birthdate: 1950-12-26 Sex: female Admission Date (Current Location): 07/14/2016  [facility] and [place] Number:  Producer, television/film/video and Address:  [facility] [place] [facility] [place], [address] [place], [place] [address]      Provider Number: [id number]  Attending Physician Name and Address:  Micki Riley, MD  Relative Name and Phone Number:       Current Level of Care: Hospital Recommended Level of Care: Assisted Living Facility Prior Approval Number:    Date Approved/Denied:   PASRR Number: [id number]    Discharge Plan: Assisted Living Facility   Current Diagnoses: Patient Active Problem List   Diagnosis Date Noted  . Ischemic stroke (HCC) 07/14/2016  . Acute right hemiparesis (HCC)   . Dysarthria   . Dementia without behavioral disturbance   . Syncope 11/02/2013  . Syncope and collapse 11/02/2013  . Blind 11/02/2013  . Hypothyroidism 11/02/2013  . History of CVA (cerebrovascular accident) 11/02/2013  . Bradycardia 11/02/2013  . Tobacco abuse 11/02/2013  . Memory disorder 08/02/2013  . Abnormality of gait 08/02/2013  . Chronic diastolic heart failure (HCC) 12/19/2011  . ANEMIA 05/04/2009  . LEUKOCYTOSIS UNSPECIFIED 05/04/2009  . 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 ABDOMINAL PAIN, LOWER 05/04/2009    Orientation RESPIRATION BLADDER Height & Weight     Self, Place  Normal Incontinent Weight: 135 lb 9.3 oz (61.5 kg) Height:  5\' 1"  (154.9 cm)  BEHAVIORAL SYMPTOMS/MOOD NEUROLOGICAL BOWEL NUTRITION STATUS      Continent Diet (DIET DYS 2, thin liquids)  AMBULATORY STATUS COMMUNICATION OF NEEDS Skin   Limited Assist Verbally Normal                       Personal Care Assistance Level of Assistance  Bathing, Feeding, Dressing Bathing Assistance: Extensive assistance Feeding assistance: Independent Dressing Assistance: Extensive assistance      Functional Limitations Info  Sight, Hearing, Speech Sight Info: Impaired Hearing Info: Impaired Speech Info: Adequate    SPECIAL CARE FACTORS FREQUENCY  PT (By licensed PT), OT (By licensed OT)     PT Frequency: 3x week OT Frequency: 3x week            Contractures Contractures Info: Not present    Additional Factors Info  Code Status, Allergies Code Status Info: Full Code Allergies Info: No known allergies           Current Medications (07/15/2016):  This is the current hospital active medication list Current Facility-Administered Medications  Medication Dose Route Frequency Provider Last Rate Last Dose  . 0.9 %  sodium chloride infusion   Intravenous Continuous Versie Starks, MD 75 mL/hr at 07/15/16 0700    . acetaminophen (TYLENOL) tablet 650 mg  650 mg Oral Q4H PRN Su Hoff, MD       Or  . acetaminophen (TYLENOL) solution 650 mg  650 mg Per Tube Q4H PRN Su Hoff, MD       Or  . acetaminophen (TYLENOL) suppository 650 mg  650 mg Rectal Q4H PRN Carly J Rivet, MD      . ARIPiprazole (ABILIFY) tablet 2 mg  2 mg Oral QHS Layne Benton, NP      . aspirin EC tablet 325 mg  325 mg Oral Daily Layne Benton, NP   325 mg at 07/15/16 1436  . donepezil (ARICEPT) tablet 5 mg  5 mg Oral QHS Layne BentonSharon L Biby, NP      . Melene Muller[START ON 07/16/2016] levothyroxine (SYNTHROID, LEVOTHROID) tablet 25 mcg  25 mcg Oral QAC breakfast Layne BentonSharon L Biby, NP      . MEDLINE mouth rinse  15 mL Mouth Rinse BID Versie Starksimothy James Oster, MD   15 mL at 07/15/16 1002  . memantine (NAMENDA) tablet 10 mg  10 mg Oral BID Layne BentonSharon L Biby, NP   10 mg at 07/15/16 1435  . mirtazapine (REMERON) tablet 7.5 mg  7.5 mg Oral QHS Layne BentonSharon L Biby, NP      . multivitamin with minerals tablet 1 tablet  1 tablet Oral Daily Layne BentonSharon L Biby, NP   1 tablet at 07/15/16 1435  . pantoprazole (PROTONIX) injection 40 mg  40 mg Intravenous QHS Su Hoffarly J Rivet, MD   40 mg at 07/14/16 2212  . senna-docusate (Senokot-S) tablet 1 tablet  1  tablet Oral QHS PRN Su Hoffarly J Rivet, MD      . sertraline (ZOLOFT) tablet 75 mg  75 mg Oral QHS Layne BentonSharon L Biby, NP      . simvastatin (ZOCOR) tablet 20 mg  20 mg Oral QHS Layne BentonSharon L Biby, NP         Discharge Medications: Please see discharge summary for a list of discharge medications.  Relevant Imaging Results:  Relevant Lab Results:   Additional Information SSN: [id number]  Volney AmericanBridget A Mayton, LCSW  I have personally examined this patient, reviewed notes, independently viewed imaging studies, participated in medical decision making and plan of care.ROS completed by me personally and pertinent positives fully documented  I have made any additions or clarifications directly to the above note. Agree with note above.    Delia HeadyPramod Sethi, MD Medical Director [facility] Pager: [phone] 07/15/2016 4:11 PM

## 2016-07-15 NOTE — Progress Notes (Signed)
  Speech Language Pathology Treatment: Dysphagia  Patient Details Name: Kimberly [name] MRN: [id number] DOB: Oct 18, 1950 Today's Date: 07/15/2016 Time: [id number] SLP Time Calculation (min) (ACUTE ONLY): 10 min  Assessment / Plan / Recommendation Clinical Impression  Pt seems to have improved alertness and sustained attention as compared to initial evaluation. She continues to have mild right-sided weakness that prolongs her oral manipulation of solid boluses, but she has improved oral clearance with use of liquid wash. SLP provided Min cues for smaller sips, but even with larger boluses she did not have overt signs of aspiration. Recommend that she initiate Dys 2 diet and thin liquids with potential for further solid advancement.    HPI HPI: Kimberly CaddySara L Collinsis a 66 y.o.femalewith a history of CVA, who presents to the Emergency Departmentvia EMS from an assisted living facility following choking episode this morning. Staff noted pt seemed to zone out and her face "looked twisted" following this choking episode. Pt is usually able to converse but has not been speaking since. Head CT negative.       SLP Plan  Goals updated       Recommendations  Diet recommendations: Dysphagia 2 (fine chop);Thin liquid Liquids provided via: Cup;Straw Medication Administration: Whole meds with puree Supervision: Full supervision/cueing for compensatory strategies;Patient able to self feed Compensations: Minimize environmental distractions;Slow rate;Small sips/bites;Follow solids with liquid Postural Changes and/or Swallow Maneuvers: Seated upright 90 degrees;Upright 30-60 min after meal                Oral Care Recommendations: Oral care BID Follow up Recommendations: Skilled Nursing facility SLP Visit Diagnosis: Dysphagia, oropharyngeal phase (R13.12) Plan: Goals updated       GO                [name], Shervon Kerwin 07/15/2016, 10:06 AM  Maxcine [name], M.A. CCC-SLP [phone]

## 2016-07-15 NOTE — Care Management Note (Signed)
Case Management Note  Patient Details  Name: Rae MarSara L Valent MRN: [id number] Date of Birth: 1951-03-01  Subjective/Objective:  Pt admitted on 07/14/16 with stroke symptoms s/p TPA.  PTA, [facility]                   Action/Plan: PT/OT recommending likely SNF level of care at discharge.  CSW consulted to facilitate possible dc to SNF for rehab at discharge.  Will follow.    Expected Discharge Date:                  Expected Discharge Plan:  Skilled Nursing Facility  In-House Referral:  Clinical Social Work  Discharge planning Services  CM Consult  Post Acute Care Choice:    Choice offered to:     DME Arranged:    DME Agency:     HH Arranged:    HH Agency:     Status of Service:  In process, will continue to follow  If discussed at Long Length of Stay Meetings, dates discussed:    Additional Comments:  Quintella BatonJulie W. Cala Kruckenberg, RN, BSN  Trauma/Neuro ICU Case Manager [phone]

## 2016-07-15 NOTE — Evaluation (Signed)
Physical Therapy Evaluation Patient Details Name: Kimberly [name] MRN: [id number] DOB: 06/06/1950 Today's Date: 07/15/2016   History of Present Illness  This 66 y.o. female transferred from [facility] where she presented after choking on her coffee, twisted mouth, and unable to talk.  She was administered tPA.  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  PMH includes:  COPD, Blind, abnormality of gait, memory disorder, HOH, CHF.  Pt resided at assisted living.  Clinical Impression  Pt presents to PT with generalized weakness, decreased mobility, impaired problem solving and impaired memory. PTA, pt resided at nursing facility, and is poor historian when recalling PLOF and assist needed for mobility and ADLs. Today, pt amb to chair with RW and minA, requiring increased time and verbal/tactile cues for initiation and sequencing. Pt would benefit from continued acute PT to maximize functional mobility and independence. Hopefully she will be able to return to ALF with HHPT if facility is able to provide necessary level of assist; if not, she may benefit from SNF.     Follow Up Recommendations SNF;Supervision for mobility/OOB (pending progress)    Equipment Recommendations  None recommended by PT    Recommendations for Other Services       Precautions / Restrictions Precautions Precautions: Fall Restrictions Weight Bearing Restrictions: No      Mobility  Bed Mobility Overal bed mobility: Needs Assistance Bed Mobility: Supine to Sit     Supine to sit: Mod assist;HOB elevated     General bed mobility comments: sitting in chair   Transfers Overall transfer level: Needs assistance Equipment used: Rolling walker (2 wheeled) Transfers: Sit to/from [employer] Sit to Stand: Min assist Stand pivot transfers: Min assist       General transfer  comment: assist to move into standing, and assist to guide walker and hips toward surfaces.  Visual deficits limit her ability to perform more independently   Ambulation/Gait Ambulation/Gait assistance: Min assist Ambulation Distance (Feet): 5 Feet Assistive device: Rolling walker (2 wheeled) Gait Pattern/deviations: Step-to pattern;Decreased step length - right;Decreased step length - left Gait velocity: Decreased velocity.  Gait velocity interpretation: Below normal speed for age/gender General Gait Details: Amb to bedside chair with RW and minA for safety. Pt inconsistently followed verbal cues and required physical guidance of RW towards chair.   Stairs            Wheelchair Mobility    Modified Rankin (Stroke Patients Only) Modified Rankin (Stroke Patients Only) Pre-Morbid Rankin Score: Moderately severe disability Modified Rankin: Moderately severe disability     Balance Overall balance assessment: Needs assistance Sitting-balance support: Feet supported Sitting balance-Leahy Scale: Good Sitting balance - Comments: Pt able to maintain seated balance EOB, requiring verbal cues for upright posture.    Standing balance support: Bilateral upper extremity supported;During functional activity Standing balance-Leahy Scale: Fair                               Pertinent Vitals/Pain Pain Assessment: Faces Faces Pain Scale: Hurts a little bit    Home Living Family/patient expects to be discharged to:: Assisted living     Type of Home: Assisted living         Home Equipment: Walker - 2 wheels      Prior Function Level of Independence: Needs assistance   Gait / Transfers Assistance  Needed: Pt reports she amb with RW. Pt poor historian and unable to answer further history questions appropriately.   ADL's / Homemaking Assistance Needed: Pt indicates she requires assist with ADLs.  This is consistent with office noted from 2015         Hand Dominance    Dominant Hand: Right    Extremity/Trunk Assessment   Upper Extremity Assessment Upper Extremity Assessment: Generalized weakness    Lower Extremity Assessment Lower Extremity Assessment: Defer to PT evaluation    Cervical / Trunk Assessment Cervical / Trunk Assessment: Kyphotic  Communication   Communication: HOH  Cognition Arousal/Alertness: Awake/alert Behavior During Therapy: WFL for tasks assessed/performed Overall Cognitive Status: No family/caregiver present to determine baseline cognitive functioning Area of Impairment: Memory;Attention;Orientation;Following commands;Problem solving Orientation Level: Disoriented to;Place;Time;Situation Current Attention Level: Focused Memory: Decreased short-term memory Following Commands: Follows one step commands inconsistently;Follows one step commands with increased time       General Comments: Able to follow simple one-step commands with increased time. Required multiple verbal and tactile cues for initiation and sequencing.     General Comments      Exercises     Assessment/Plan    PT Assessment Patient needs continued PT services  PT Problem List Decreased strength;Decreased activity tolerance;Decreased balance;Decreased mobility;Decreased cognition;Decreased knowledge of use of DME       PT Treatment Interventions DME instruction;Functional mobility training;Therapeutic activities;Therapeutic exercise;Balance training;Gait training    PT Goals (Current goals can be found in the Care Plan section)  Acute Rehab PT Goals Patient Stated Goal: to go home  PT Goal Formulation: With patient Time For Goal Achievement: 07/29/16 Potential to Achieve Goals: Good    Frequency Min 3X/week   Barriers to discharge        Co-evaluation               End of Session Equipment Utilized During Treatment: Gait belt Activity Tolerance: Patient tolerated treatment well Patient left: in chair;with chair alarm set;with  call bell/phone within reach Nurse Communication: Mobility status PT Visit Diagnosis: Unsteadiness on feet (R26.81);Muscle weakness (generalized) (M62.81)         Time: [id number] PT Time Calculation (min) (ACUTE ONLY): 28 min   Charges:   PT Evaluation $PT Eval Moderate Complexity: 1 Procedure PT Treatments $Therapeutic Activity: 8-22 mins   PT G Codes:       Dewayne HatchJaclyn Leigh Nazar Kuan, SPT [phone]  Ina HomesJaclyn Talton Delpriore 07/15/2016, 1:37 PM

## 2016-07-16 DIAGNOSIS — I639 Cerebral infarction, unspecified: Secondary | ICD-10-CM | POA: Diagnosis not present

## 2016-07-16 DIAGNOSIS — I6340 Cerebral infarction due to embolism of unspecified cerebral artery: Secondary | ICD-10-CM | POA: Diagnosis not present

## 2016-07-16 DIAGNOSIS — G8191 Hemiplegia, unspecified affecting right dominant side: Secondary | ICD-10-CM | POA: Diagnosis not present

## 2016-07-16 LAB — VAS US CAROTID
LCCADSYS: 81 cm/s
LEFT ECA DIAS: -19 cm/s
LEFT VERTEBRAL DIAS: 7 cm/s
Left CCA dist dias: 23 cm/s
Left CCA prox dias: 23 cm/s
Left CCA prox sys: 100 cm/s
Left ICA dist dias: -30 cm/s
Left ICA dist sys: -101 cm/s
Left ICA prox dias: 18 cm/s
Left ICA prox sys: 58 cm/s
RCCADSYS: -66 cm/s
RCCAPDIAS: 9 cm/s
RCCAPSYS: 72 cm/s
RIGHT ECA DIAS: -9 cm/s
RIGHT VERTEBRAL DIAS: 10 cm/s

## 2016-07-16 LAB — HEMOGLOBIN A1C
Hgb A1c MFr Bld: 5.5 % (ref 4.8–5.6)
Mean Plasma Glucose: 111

## 2016-07-16 MED ORDER — PANTOPRAZOLE SODIUM 40 MG PO TBEC
40.0000 mg | DELAYED_RELEASE_TABLET | Freq: Every day | ORAL | Status: DC
  Administered 2016-07-16 – 2016-07-17 (×2): 40 mg via ORAL
  Filled 2016-07-16: qty 1

## 2016-07-16 NOTE — Clinical Social Work Note (Signed)
CSW spoke with Kimberly [name] at [facility]. CSW explained that according to PT note pt is a minimal assist. Kimberly [name] reports they had been assisting pt previously to hospital admission. Kimberly [name] does not see any reason why they can not accomodate pt's needs after d/c. Kimberly [name] is agreeable to pt returning to ALF at d/c with hh. Trudy reports pt has previously used [facility] or [facility] for hh. RNCM notified.   [address] Squaw [address] CourtBridget Mayton, ConnecticutLCSWA [phone]

## 2016-07-16 NOTE — Progress Notes (Signed)
STROKE TEAM PROGRESS NOTE   SUBJECTIVE (INTERVAL HISTORY) Her nurse and tech are at the bedside.  Foley placed for urinary retention.   OBJECTIVE Temp:  [98.2 F (36.8 C)-99.5 F (37.5 C)] 99.1 F (37.3 C) (02/21 1305) Pulse Rate:  [79-145] 81 (02/21 1305) Cardiac Rhythm: Normal sinus rhythm (02/21 0700) Resp:  [16-23] 17 (02/21 1305) BP: (92-129)/(63-84) 129/68 (02/21 1305) SpO2:  [71 %-100 %] 96 % (02/21 1305)  CBC:   Recent Labs Lab 07/14/16 0927  WBC 7.4  NEUTROABS 4.7  HGB 15.1*  HCT 47.0*  MCV 93.3  PLT 317    Basic Metabolic Panel:   Recent Labs Lab 07/14/16 0927  NA 140  K 3.8  CL 101  CO2 30  GLUCOSE 102*  BUN 16  CREATININE 0.85  CALCIUM 9.8    HgbA1c:  Lab Results  Component Value Date   HGBA1C 5.5 07/15/2016      PHYSICAL EXAM Pleasant middle aged lady not in distress. . Afebrile. Head is nontraumatic. Neck is supple without bruit.    Cardiac exam no murmur or gallop. Lungs are clear to auscultation. Distal pulses are well felt. Neurological Exam ;  Awake alert oriented 3. Speech is slightly nonfluent with occasional word finding difficulties. No paraphasic errors. Able to name repeat and comprehend quite well. Extraocular movements are full range without nystagmus. Fundi were not visualized. Blinks to threat bilaterally. Face is asymmetric with mild right lower face weakness. Tongue is midline. Motor system exam reveals symmetric upper and lower extremity strength without focal weakness.Diminished fine finger movements on the right. Orbits left over right upper extremity. Deep tendon flexes are symmetric. Plantars are downgoing. Gait was not tested. ASSESSMENT/PLAN Ms. ADREONA BRAND is a 66 y.o. female with history of prior stroke, grade 1 diastolic CHF, hypothyroidism, depression and blindness presenting with expressive aphasia. She received IV t-PA 07/14/2016 at 0938.   Stroke:  left insular infarct s/p IV tPA embolic secondary to unknown  source. Suspicious for atrial fibrillation given age.  CT head no acute abnormality. ASPECTS 10  MRI  Small L insular infarct. Old R thalamic infarct. Chronic cyctic pituitary lesion unchanged since 2015  MRA  Normal.   Carotid Doppler  Bilat 1-39% ICA No significant stenosis  2D Echo  Left ventricle: The cavity size was normal. Wall thickness was   normal. Systolic function was normal. The estimated ejection   fraction was in the range of 60% to 65%. Wall motion was normal;    there were no regional wall motion abnormalities  LDL 48  HgbA1c 5.5  SCDs for VTE prophylaxis DIET DYS 2 Room service appropriate? Yes; Fluid consistency: Thin  No antithrombotic prior to admission, now on No antithrombotic as within 24 post tPA. Now on aspirin    No further embolic workup as not a good anticoagulation candidate d/t memory loss  Therapy recommendations:  Pending. Ok to be OOB  Disposition:  pending (from [facility])  Transfer to the floor  Anticipate discharge in am. Location based on therapy recommendations - SNF vs return to ALF with [facility]  Hypertension  Stable  Long-term BP goal normotensive  Hyperlipidemia  Home meds:  zocor 20, will resume  LDL at goal  Continue statin at discharge  Other Stroke Risk Factors  Advanced age  UDS postive for Benzos  Former Cigarette smoker  Hx stroke/TIA  Old R thalamic lacunar infarct  Hx diastolic CHF   Other Active Problems  Memory loss on donepezil and  memantine  Urinary retention with foley placement. Remove and attempt voiding.  Hospital day # 2  Aubrionna Istre  [facility] See Amion for Pager information 07/16/2016 1:43 PM  I have personally examined this patient, reviewed notes, independently viewed imaging studies, participated in medical decision making and plan of care.ROS completed by me personally and pertinent positives fully documented  I have made any additions or clarifications  directly to the above note. Agree with note above. She presented with aphasia secondary to left hemispheric embolic infarct etiology to be determined. She received IV tPA uneventfully and has shown significant neurological improvement.  . She is not a good long-term candidate for anticoagulation given her baseline dementia and will not consider doing TEE and loop recorder. Continue aspirin for stroke prevention Transfer back to skilled nursing facility when bed available.  Delia HeadyPramod Ceniyah Thorp, MD Medical Director [facility] Pager: [phone] 07/16/2016 1:43 PM  To contact Stroke Continuity provider, please refer to [email or web address]. After hours, contact General Neurology

## 2016-07-16 NOTE — Progress Notes (Signed)
  Speech Language Pathology Treatment: Dysphagia;Cognitive-Linquistic  Patient Details Name: Kimberly [name] MRN: [id number] DOB: 14-Mar-1951 Today's Date: 07/16/2016 Time: [id number] SLP Time Calculation (min) (ACUTE ONLY): 27 min  Assessment / Plan / Recommendation Clinical Impression  Language and dysphagia intervention during breakfast. Max-total cues needed to locate food/utensils due to visual disturbance (right inattention) and scoop food. Mildly prolonged oral prep and transit with occasional trace residue. No pharyngeal impairments noted. Took large sips and required verbal cues to lower cup during drinking.   Expresses herself in phrases and short sentences with additional time needed to respond and repetition x 2-3. Stated she was here for "stroke". Required cues for responsive naming questions throughout treatment ("what do you want to drink"). Continue dysphagia and language/cognitive treatment.     HPI HPI: Kimberly CaddySara [name] Collinsis a 66 y.o.femalewith a history of CVA, who [facility] Department[facility] from an assisted living facility following choking episode this morning. Staff noted pt seemed to zone out and her face "looked twisted" following this choking episode. Pt is usually able to converse but has not been speaking since. Head CT negative. MRI pending.      SLP Plan  Continue with current plan of care       Recommendations  Diet recommendations: Dysphagia 2 (fine chop);Thin liquid Liquids provided via: Cup;Straw Medication Administration: Crushed with puree Supervision: Full supervision/cueing for compensatory strategies;Patient able to self feed (needs assist for visual disturbance as well) Compensations: Minimize environmental distractions;Slow rate;Small sips/bites;Follow solids with liquid Postural Changes and/or Swallow Maneuvers: Seated upright 90 degrees;Upright 30-60 min after meal                Oral Care Recommendations: Oral care BID Follow  up Recommendations: Skilled Nursing facility SLP Visit Diagnosis: Cognitive communication deficit (R41.841);Dysphagia, oral phase (R13.11) Plan: Continue with current plan of care       GO                Royce MacadamiaLitaker, Romulus Hanrahan Willis 07/16/2016, 10:02 AM  Breck CoonsLisa Willis Lonell FaceLitaker M.Ed [employer]SL[facility] Pager [phone]

## 2016-07-16 NOTE — Progress Notes (Signed)
Physical Therapy Treatment Patient Details Name: Kimberly [name] MRN: [id number] DOB: 1950-12-31 Today's Date: 07/16/2016    History of Present Illness This 66 y.o. female transferred from [facility] where she presented after choking on her coffee, twisted mouth, and unable to talk.  She was administered tPA.  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  PMH includes:  COPD, Blind, abnormality of gait, memory disorder, HOH, CHF.  Pt resided at [facility].    PT Comments    Pt generally steady with minimal assist for guidance and the RW.  She is quick to ask to sit down and likely could do more.     Follow Up Recommendations  SNF;Supervision for mobility/OOB     Equipment Recommendations  None recommended by PT    Recommendations for Other Services       Precautions / Restrictions Precautions Precautions: Fall    Mobility  Bed Mobility Overal bed mobility: Needs Assistance Bed Mobility: Supine to Sit     Supine to sit: Min guard     General bed mobility comments: sitting in chair   Transfers Overall transfer level: Needs assistance Equipment used: Rolling walker (2 wheeled) Transfers: Sit to/from [employer] Sit to Stand: Min assist Stand pivot transfers: Min assist       General transfer comment: cues for hand placement and direction.  Assist more for direction than lift/stability assist.  Ambulation/Gait Ambulation/Gait assistance: Min assist Ambulation Distance (Feet): 15 Feet Assistive device: Rolling walker (2 wheeled) Gait Pattern/deviations: Step-through pattern Gait velocity: Decreased velocity.  Gait velocity interpretation: Below normal speed for age/gender General Gait Details: pt needing directional assist due to blindness.  Pt is tentative, but generally steady today.   Stairs            Wheelchair  Mobility    Modified Rankin (Stroke Patients Only) Modified Rankin (Stroke Patients Only) Pre-Morbid Rankin Score: Moderately severe disability Modified Rankin: Moderately severe disability     Balance Overall balance assessment: Needs assistance Sitting-balance support: Feet supported Sitting balance-Leahy Scale: Good     Standing balance support: Bilateral upper extremity supported;During functional activity Standing balance-Leahy Scale: Fair Standing balance comment: holds for environmental cues                    Cognition Arousal/Alertness: Awake/alert Behavior During Therapy: WFL for tasks assessed/performed Overall Cognitive Status: No family/caregiver present to determine baseline cognitive functioning Area of Impairment: Memory;Attention;Orientation;Following commands;Problem solving Orientation Level: Situation;Time;Place Current Attention Level: Sustained Memory: Decreased short-term memory Following Commands: Follows one step commands with increased time     Problem Solving: Slow processing;Requires tactile cues;Requires verbal cues      Exercises      General Comments        Pertinent Vitals/Pain Pain Assessment: Faces Faces Pain Scale: No hurt    Home Living                      Prior Function            PT Goals (current goals can now be found in the care plan section) Acute Rehab PT Goals Patient Stated Goal: to go home  PT Goal Formulation: With patient Time For Goal Achievement: 07/29/16 Potential to Achieve Goals: Good Progress towards PT goals: Progressing toward goals    Frequency    Min 3X/week  PT Plan Current plan remains appropriate    Co-evaluation             End of Session   Activity Tolerance: Patient tolerated treatment well Patient left: in chair;with chair alarm set;with call bell/phone within reach Nurse Communication: Mobility status PT Visit Diagnosis: Unsteadiness on feet  (R26.81);Muscle weakness (generalized) (M62.81)     Time: 1610-9604 PT Time Calculation (min) (ACUTE ONLY): 16 min  Charges:  $Therapeutic Activity: 8-22 mins                    G CodesEliseo Gum Gari Hartsell 07/16/2016, 5:35 PM 07/16/2016  Chamois Bing, PT [phone]  (pager)

## 2016-07-17 DIAGNOSIS — I6340 Cerebral infarction due to embolism of unspecified cerebral artery: Secondary | ICD-10-CM | POA: Diagnosis not present

## 2016-07-17 DIAGNOSIS — I639 Cerebral infarction, unspecified: Secondary | ICD-10-CM | POA: Diagnosis not present

## 2016-07-17 DIAGNOSIS — R471 Dysarthria and anarthria: Secondary | ICD-10-CM | POA: Diagnosis not present

## 2016-07-17 LAB — URINE CULTURE: Culture: >=100000 — AB

## 2016-07-17 MED ORDER — SULFAMETHOXAZOLE-TRIMETHOPRIM 400-80 MG PO TABS
1.0000 | ORAL_TABLET | Freq: Two times a day (BID) | ORAL | Status: DC
  Administered 2016-07-17 – 2016-07-18 (×3): 1 via ORAL
  Filled 2016-07-17 (×3): qty 1

## 2016-07-17 NOTE — Care Management Note (Signed)
Case Management Note  Patient Details  Name: Kimberly [name] MRN: [id number] Date of Birth: 1951/02/18  Subjective/Objective:                    Action/Plan: Pt being treated for UTI. Plan [facility]. Facility requesting medication list as early as able in am so they can have her medications for the weekend. CSW informed. CM following for d/c needs and physician orders.  Expected Discharge Date:                  Expected Discharge Plan:  Skilled Nursing Facility  In-House Referral:  Clinical Social Work  Discharge planning Services  CM Consult  Post Acute Care Choice:    Choice offered to:     DME Arranged:    DME Agency:     HH Arranged:    HH Agency:     Status of Service:  In process, will continue to follow  If discussed at Long Length of Stay Meetings, dates discussed:    Additional Comments:  Kermit BaloKelli F Daffney Greenly, RN 07/17/2016, 2:07 PM

## 2016-07-17 NOTE — Progress Notes (Signed)
STROKE TEAM PROGRESS NOTE   SUBJECTIVE (INTERVAL HISTORY) Her nurse is at the bedside. Foley removed last night. Incontinent Throughout the night, however, had 900 mL residual this a.m. urine culture positive for Klebsiella. Started on antibiotics. Will monitor voiding today and subsequent residuals. Tommy, patient's brother, unable to visit due to disability. Dr. Pearlean BrownieSethi, spoke to her , brother Kimberly [name] over  the phone and updated him on diagnosis, prognosis and plan of care.  OBJECTIVE Temp:  [97.5 F (36.4 C)-100.5 F (38.1 C)] 99.6 F (37.6 C) (02/22 0948) Pulse Rate:  [77-87] 79 (02/22 0830) Cardiac Rhythm: Normal sinus rhythm (02/22 0837) Resp:  [18-20] 20 (02/22 0100) BP: (121-137)/(66-82) 121/71 (02/22 0830) SpO2:  [96 %-97 %] 96 % (02/22 0830)  PHYSICAL EXAM Pleasant middle aged lady not in distress. . Afebrile. Head is nontraumatic. Neck is supple without bruit.    Cardiac exam no murmur or gallop. Lungs are clear to auscultation. Distal pulses are well felt. Neurological Exam ;  Awake alert oriented 3. Speech is slightly nonfluent with occasional word finding difficulties. No paraphasic errors. Able to name repeat and comprehend quite well. Extraocular movements are full range without nystagmus. Fundi were not visualized. Blinks to threat bilaterally. Face is asymmetric with mild right lower face weakness. Tongue is midline. Motor system exam reveals symmetric upper and lower extremity strength without focal weakness.Diminished fine finger movements on the right. Orbits left over right upper extremity. Deep tendon flexes are symmetric. Plantars are downgoing. Gait was not tested.  ASSESSMENT/PLAN [name] is a 66 y.o. female with history of prior stroke, grade 1 diastolic CHF, hypothyroidism, depression and blindness presenting with expressive aphasia. She received IV t-PA 07/14/2016 at 0938.   Stroke:  left insular infarct s/p IV tPA embolic secondary to unknown source.  Suspicious for atrial fibrillation given age, but not confirmed.  CT head no acute abnormality. ASPECTS 1  MRI  Small L insular infarct. Old R thalamic infarct. Chronic cyctic pituitary lesion unchanged since 2015  MRA  Normal.   Carotid Doppler  Bilat 1-39% ICA No significant stenosis   2D Echo  EF 60% to 65%.  no source of embolus  LDL 48  HgbA1c 5.5  SCDs for VTE prophylaxis DIET DYS 2 Room service appropriate? Yes; Fluid consistency: Thin  No antithrombotic prior to admission, now on No antithrombotic as within 24 post tPA. Now on aspirin    No further embolic workup as not a good anticoagulation candidate d/t memory loss  Therapy recommendations:  SNF.   Disposition:  Plan return to ALF. They can provide care for her alone with HH ([facility])  Hypertension  Stable  Long-term BP goal normotensive  Hyperlipidemia  Home meds:  zocor 20, resumed  LDL at goal  Continue statin at discharge  Other Stroke Risk Factors  Advanced age  UDS postive for Benzos  Former Cigarette smoker  Hx stroke/TIA  Old R thalamic lacunar infarct  Hx diastolic CHF   UTI Urinary retention   urine culture positive for Klebsiella pneumonia. Sensitivities pending   900mL residual overnight. Required I&O cath.   monitor output as well as urinary retention today.   Start Bactrim 1. B.i.d. times 7 days. Follow-up sensitivities  I&O cath prn. Avoid foley. Discussed with RN  Other Active Problems  Memory loss on donepezil and memantine  Hospital day # 3  Rhoderick MoodyBIBY,SHARON  [facility] See Amion for Pager information 07/17/2016 3:00 PM  I have personally examined  this patient, reviewed notes, independently viewed imaging studies, participated in medical decision making and plan of care.ROS completed by me personally and pertinent positives fully documented  I have made any additions or clarifications directly to the above note. Agree with note above.  She presented with aphasia secondary to left hemispheric embolic infarct etiology to be determined. She received IV tPA uneventfully and has shown significant neurological improvement.  . She is not a good long-term candidate for anticoagulation given her baseline dementia and will not consider doing TEE and loop recorder. Continue aspirin for stroke prevention Transfer back to skilled nursing facility when bed available. Mobilize out of bed and see if she can pass urine without retention. Treat urinary tract infection with Bactrim greater than 50% time during this 25 minute visit was spent on counseling and coordination of care about her stroke, dementia, urinary tract infection Discussed with her brother over the phone and answered questions.  Delia Heady, MD Medical Director [facility] Pager: [phone] 07/17/2016 3:00 PM  To contact Stroke Continuity provider, please refer to [email or web address]. After hours, contact General Neurology

## 2016-07-17 NOTE — Progress Notes (Signed)
Bladder scan volume of 800 mL/ Patient in/out cathed at 2000. 1,000 mL yellow, mucous urine emptied. MD paged. RN will continue to monitor.

## 2016-07-17 NOTE — Progress Notes (Signed)
Patient intermittent urinary cath completed per protocol. 900 mL urine out. NP notified. No urine specimen needed. NP reviewing current orders and will adjust treatment plan as needed. RN will continue to monitor.

## 2016-07-17 NOTE — Progress Notes (Signed)
Physical Therapy Treatment Patient Details Name: Kimberly [name] MRN: [id number] DOB: 29-Mar-1951 Today's Date: 07/17/2016    History of Present Illness This 66 [facility].o. female [facility] after choking on her coffee, twisted mouth, and unable to talk.  She was administered tPA.  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  PMH includes:  COPD, Blind, abnormality of gait, memory disorder, HOH, CHF.  [facility]    PT Comments    Pt requires max VCs and assist to position and steer RW with gait training.  Pt requires increased assist due to deficits and will benefit from skilled rehab to improve safety and coping mechanisms with mobility before returning home.    Follow Up Recommendations  SNF;Supervision for mobility/OOB     Equipment Recommendations  None recommended by PT    Recommendations for Other Services       Precautions / Restrictions Precautions Precautions: Fall Restrictions Weight Bearing Restrictions: No    Mobility  Bed Mobility Overal bed mobility: Needs Assistance Bed Mobility: Supine to Sit     Supine to sit: Min guard     General bed mobility comments: tactile cues for hand placement due to vision.    Transfers Overall transfer level: Needs assistance Equipment used: Rolling walker (2 wheeled) Transfers: Sit to/from Stand Sit to Stand: Min assist Stand pivot transfers: Min assist       General transfer comment: cues for hand placement and direction.  Assist more for direction than lift/stability assist.  Tactile cues for hand placement on RW once in standing.    Ambulation/Gait Ambulation/Gait assistance: Min assist Ambulation Distance (Feet): 40 Feet (x2 trials.  ) Assistive device: Rolling walker (2 wheeled) Gait Pattern/deviations: Step-through pattern Gait velocity: Decreased  velocity.  Gait velocity interpretation: Below normal speed for age/gender General Gait Details: pt needing directional assist due to blindness.  Pt drifts to R and left and at times requires assist to turn RW and avoid obstacles in halls.     Stairs            Wheelchair Mobility    Modified Rankin (Stroke Patients Only) Modified Rankin (Stroke Patients Only) Pre-Morbid Rankin Score: Moderately severe disability Modified Rankin: Moderately severe disability     Balance Overall balance assessment: Needs assistance Sitting-balance support: Feet supported Sitting balance-Leahy Scale: Good Sitting balance - Comments: Pt able to maintain seated balance EOB, requiring verbal cues for upright posture.      Standing balance-Leahy Scale: Fair Standing balance comment: holds for environmental cues                    Cognition Arousal/Alertness: Awake/alert Behavior During Therapy: WFL for tasks assessed/performed Overall Cognitive Status: No family/caregiver present to determine baseline cognitive functioning Area of Impairment: Memory;Attention;Orientation;Following commands;Problem solving Orientation Level: Situation;Time;Place Current Attention Level: Sustained Memory: Decreased short-term memory Following Commands: Follows one step commands with increased time     Problem Solving: Slow processing;Requires tactile cues;Requires verbal cues General Comments: Able to follow simple one-step commands with increased time. Required multiple verbal and tactile cues for initiation and sequencing.     Exercises      General Comments        Pertinent Vitals/Pain Pain Assessment: Faces Faces Pain Scale: No hurt Pain Descriptors / Indicators: Discomfort;Grimacing;Guarding Pain Intervention(s): Monitored during session;Repositioned    Home Living  Prior Function            PT Goals (current goals can now be found in the care plan  section) Acute Rehab PT Goals Patient Stated Goal: to go home  Potential to Achieve Goals: Good Progress towards PT goals: Progressing toward goals    Frequency    Min 3X/week      PT Plan Current plan remains appropriate    Co-evaluation             End of Session Equipment Utilized During Treatment: Gait belt Activity Tolerance: Patient tolerated treatment well Patient left: in chair;with chair alarm set;with call bell/phone within reach Nurse Communication: Mobility status PT Visit Diagnosis: Unsteadiness on feet (R26.81);Muscle weakness (generalized) (M62.81)     Time: 1610-9604 PT Time Calculation (min) (ACUTE ONLY): 19 min  Charges:  $Gait Training: 8-22 mins                    G Codes:       Florestine Avers 07-24-2016, 5:03 PM Joycelyn Rua, PTA pager [phone]

## 2016-07-17 NOTE — Progress Notes (Signed)
Monitoring for acute urinary retention per protocol continued per MD.

## 2016-07-17 NOTE — Progress Notes (Signed)
  Speech Language Pathology Treatment: Dysphagia;Cognitive-Linquistic  Patient Details Name: Kimberly [name] MRN: [id number] DOB: July 27, 1950 Today's Date: 07/17/2016 Time: 1541-1550 SLP Time Calculation (min) (ACUTE ONLY): 9 min  Assessment / Plan / Recommendation Clinical Impression  Pt was ruminating on finding her room and going back to bed, needing Max cues for reorientation and recall that she was already in her bed. Occasional word finding errors noted. She consumed limited trials of solids with mild oral residue, but suspect increased difficulty would be observed across meals. No overt s/s of aspiration observed with thin liquids by straw. Would continue with current diet for now.   HPI HPI: Roxine CaddySara L Collinsis a 66 y.o.femalewith a history of CVA, who presents to the Emergency Departmentvia EMS from an assisted living facility following choking episode this morning. Staff noted pt seemed to zone out and her face "looked twisted" following this choking episode. Pt is usually able to converse but has not been speaking since. Head CT negative. MRI pending.      SLP Plan  Continue with current plan of care       Recommendations  Diet recommendations: Dysphagia 2 (fine chop);Thin liquid Liquids provided via: Cup;Straw Medication Administration: Crushed with puree Supervision: Full supervision/cueing for compensatory strategies;Patient able to self feed Compensations: Minimize environmental distractions;Slow rate;Small sips/bites;Follow solids with liquid Postural Changes and/or Swallow Maneuvers: Seated upright 90 degrees;Upright 30-60 min after meal                Oral Care Recommendations: Oral care BID Follow up Recommendations: Skilled Nursing facility SLP Visit Diagnosis: Cognitive communication deficit (R41.841);Dysphagia, oral phase (R13.11) Plan: Continue with current plan of care       GO                [name], Zael Shuman 07/17/2016, 4:18 PM  Maxcine HamLaura  Paiewonsky, M.A. CCC-SLP [phone]

## 2016-07-17 NOTE — Progress Notes (Signed)
Occupational Therapy Treatment Patient Details Name: Kimberly [name] MRN: [id number] DOB: April 03, 1951 Today's Date: 07/17/2016    History of present illness This 66 [facility] after choking on her coffee, twisted mouth, and unable to talk.  She was administered tPA.  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  PMH includes:  COPD, Blind, abnormality of gait, memory disorder, HOH, CHF.  [facility]   OT comments  Pt progressing toward OT goals. She requires min assist for toilet transfers and LB dressing tasks and supervision with seated grooming tasks. On ambulation for simulated toilet transfers, pt reporting feeling nauseous and assisted pt to return to chair. She reported that this resolved once seated. Pt requires maximum verbal and tactile cues to sequence and problem solve through basic ADL tasks. Pt additionally with decreased short-term memory and unable to recall education concerning how to notify nursing staff of her needs utilizing modified call light after approximately 15 minutes. Will continue to follow acutely.   Follow Up Recommendations  Home health OT;Supervision/Assistance - 24 hour;Other (comment) (return to ALF vs SNF if they are unable to provide assist)    Equipment Recommendations  3 in 1 bedside commode    Recommendations for Other Services      Precautions / Restrictions Precautions Precautions: Fall Restrictions Weight Bearing Restrictions: No       Mobility Bed Mobility Overal bed mobility: Needs Assistance Bed Mobility: Supine to Sit     Supine to sit: Min guard     General bed mobility comments: OOB in chair on OT arrival.  Transfers Overall transfer level: Needs assistance Equipment used: Rolling walker (2 wheeled) Transfers: Sit to/from Stand Sit to Stand: Min  assist Stand pivot transfers: Min assist       General transfer comment: VC's for hand placement and sequencing.    Balance Overall balance assessment: Needs assistance Sitting-balance support: Feet supported Sitting balance-Leahy Scale: Good Sitting balance - Comments: Pt able to maintain seated balance EOB, requiring verbal cues for upright posture.    Standing balance support: Bilateral upper extremity supported;During functional activity Standing balance-Leahy Scale: Poor Standing balance comment: holds for environmental cues                   ADL Overall ADL's : Needs assistance/impaired     Grooming: Wash/dry face;Wash/dry hands;Brushing hair;Set up;Supervision/safety;Sitting               Lower Body Dressing: Minimal assistance;Sit to/from stand   Toilet Transfer: Minimal assistance;Ambulation;RW;BSC Toilet Transfer Details (indicate cue type and reason): Simulated with sit<>stand followed by functional mobility.         Functional mobility during ADLs: Minimal assistance;Rolling walker General ADL Comments: Pt limited with functional mobility this session as she became emotional and reported feeling nauseous while walking to sink for grooming tasks.      Vision                 Additional Comments: Pt with blindness at baseline. She reports that she cannot see light, shadows, or objects.   Perception     Praxis      Cognition   Behavior During Therapy: WFL for tasks assessed/performed Overall Cognitive Status: No family/caregiver present to determine baseline cognitive functioning Area of Impairment: Memory;Attention;Orientation;Following commands;Problem solving Orientation Level: Situation;Time;Place;Disoriented to Current Attention Level: Sustained Memory: Decreased  short-term memory  Following Commands: Follows one step commands with increased time     Problem Solving: Slow processing;Requires tactile cues;Requires verbal  cues General Comments: Requires multiple verbal and tactile cues for initiation and sequencing. She requires multiple repititions of one step commands and increased time.      Exercises     Shoulder Instructions       General Comments      Pertinent Vitals/ Pain       Pain Assessment: No/denies pain Faces Pain Scale: No hurt Pain Descriptors / Indicators: Discomfort;Grimacing;Guarding Pain Intervention(s): Monitored during session;Repositioned  Home Living                                          Prior Functioning/Environment              Frequency  Min 2X/week        Progress Toward Goals  OT Goals(current goals can now be found in the care plan section)  Progress towards OT goals: Progressing toward goals  Acute Rehab OT Goals Patient Stated Goal: to go home  OT Goal Formulation: With patient Time For Goal Achievement: 07/29/16 Potential to Achieve Goals: Good ADL Goals Pt Will Perform Upper Body Bathing: with supervision;sitting;with set-up Pt Will Perform Lower Body Bathing: with min assist;sit to/from stand Pt Will Transfer to Toilet: with min assist;ambulating;regular height toilet;grab bars Pt Will Perform Toileting - Clothing Manipulation and hygiene: with min assist;sit to/from stand  Plan Discharge plan remains appropriate    Co-evaluation                 End of Session Equipment Utilized During Treatment: Rolling walker  OT Visit Diagnosis: Unsteadiness on feet (R26.81)   Activity Tolerance Patient tolerated treatment well   Patient Left in chair;with call bell/phone within reach;with chair alarm set   Nurse Communication          Time: [id number] OT Time Calculation (min): 15 min  Charges: OT General Charges $OT Visit: 1 Procedure OT Treatments $Self Care/Home Management : 8-22 mins  Doristine Section, MS OTR/L  Pager: [phone]    Kimberly [name] 07/17/2016, 5:50 PM

## 2016-07-18 DIAGNOSIS — R339 Retention of urine, unspecified: Secondary | ICD-10-CM | POA: Clinically undetermined

## 2016-07-18 DIAGNOSIS — E785 Hyperlipidemia, unspecified: Secondary | ICD-10-CM | POA: Diagnosis present

## 2016-07-18 DIAGNOSIS — I10 Essential (primary) hypertension: Secondary | ICD-10-CM | POA: Diagnosis present

## 2016-07-18 DIAGNOSIS — I639 Cerebral infarction, unspecified: Secondary | ICD-10-CM | POA: Diagnosis not present

## 2016-07-18 DIAGNOSIS — G8191 Hemiplegia, unspecified affecting right dominant side: Secondary | ICD-10-CM | POA: Diagnosis not present

## 2016-07-18 MED ORDER — ASPIRIN 325 MG PO TBEC: 325.0000 mg | DELAYED_RELEASE_TABLET | Freq: Every day | ORAL | 0 refills | Status: DC

## 2016-07-18 MED ORDER — SULFAMETHOXAZOLE-TRIMETHOPRIM 400-80 MG PO TABS: 1.0000 | ORAL_TABLET | Freq: Two times a day (BID) | ORAL | 0 refills | Status: AC

## 2016-07-18 NOTE — Care Management Note (Signed)
Case Management Note  Patient Details  Name: Kimberly [name] MRN: [id number] Date of Birth: 1950/10/19  Subjective/Objective:                    Action/Plan: [facility]. [facility] was active with [facility] prior to admission. Vicky with [facility] notified of the resumption of care and of services needed. CSW following for further d/c needs.   Expected Discharge Date:  07/18/16               Expected Discharge Plan:  Skilled Nursing Facility  In-House Referral:  Clinical Social Work  Discharge planning Services  CM Consult  Post Acute Care Choice:  Home Health Choice offered to:   (facility)  DME Arranged:    DME Agency:     HH Arranged:  RN, PT, OT, Speech Therapy HH Agency:  [facility]  Status of Service:  Completed, signed off  If discussed at Long Length of Stay Meetings, dates discussed:    Additional Comments:  Kermit BaloKelli F Misty Foutz, RN 07/18/2016, 11:41 AM

## 2016-07-18 NOTE — Clinical Social Work Note (Addendum)
CSW confirmed with MD plan for discharge today. He asked that CSW notify ALF that patient will discharge with a foley and will have it until at least her outpatient urology appt in a week. ALF notified. CSW notified Annie MainSharon Biby, NP that discharge summary needs to be in by 12:00 or 1:00 at the latest so that the ALF can get her medications. Fax number obtained to fax FL2 and discharge summary when available.  Charlynn CourtSarah Esterlene Atiyeh, CSW [phone]  12:06 pm FL2 and discharge summary faxed [facility].  Charlynn CourtSarah Oswin Johal, CSW [phone]

## 2016-07-18 NOTE — Clinical Social Work Note (Addendum)
[facility]. Trudy at ALF has confirmed she has all the paperwork she needs. Facility has arranged transportation. RN to call report [phone].  [address]Brid[address] Mayton, [place]WA [phone]

## 2016-07-18 NOTE — Progress Notes (Signed)
Patient discharged to [facility] living[facility] to [facility]. Patient recognized transporter's voice. Staff assisted patient to vehicle.

## 2016-07-18 NOTE — Care Management Important Message (Signed)
Important Message  Patient Details  Name: Kimberly [name] MRN: [id number] Date of Birth: [birth year]   Medicare Important Message Given:  Yes    [name], [name] 07/18/2016, 2:09 PM

## 2016-07-18 NOTE — Discharge Summary (Signed)
Stroke Discharge Summary  Patient ID: Kimberly [name]   MRN: [id number]      DOB: 1950/12/28  Date of Admission: 07/14/2016 Date of Discharge: 07/18/2016  Attending Physician:  Micki Riley, MD, Stroke MD Consultant(s):   phone consult with Dr. Marlou Porch, urology  Patient's PCP:  Calvert Cantor, MD  DISCHARGE DIAGNOSIS:  Principal Problem:   Ischemic stroke (HCC) - L insular embolic infarct s/p tPA, source unknown Active Problems:   Memory disorder   Blind   History of CVA (cerebrovascular accident)   Acute right hemiparesis (HCC)   Dysarthria   Dementia without behavioral disturbance   Urinary retention   Essential hypertension   Hyperlipidemia  Past Medical History:  Diagnosis Date  . Abnormality of gait 08/02/2013  . Anemia   . Arthritis    osteoarthritis  . Blind   . CHF (congestive heart failure) (HCC)   . COPD (chronic obstructive pulmonary disease) (HCC)   . DDD (degenerative disc disease)   . Depression   . HOH (hard of hearing)   . Hypothyroidism   . Memory disorder 08/02/2013  . Pneumonia   . Sepsis (HCC)   . Shortness of breath   . Tobacco abuse 11/02/2013   Past Surgical History:  Procedure Laterality Date  . ARM DEBRIDEMENT     as child  . CATARACT EXTRACTION W/PHACO  04/28/2011   Procedure: CATARACT EXTRACTION PHACO AND INTRAOCULAR LENS PLACEMENT (IOC);  Surgeon: Susa Simmonds;  Location: [facility];  Service: Ophthalmology;  Laterality: Left;  CDE: 5.37  . CESAREAN SECTION    . 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 STRABISMUS SURGERY     age 65    Allergies as of 07/18/2016   No Known Allergies     Medication List    TAKE these medications   acetaminophen 325 MG tablet Commonly known as:  TYLENOL Take 650 mg by mouth 2 (two) times daily.   ARIPiprazole 2 MG tablet Commonly known as:  ABILIFY Take 2 mg by mouth at bedtime.   aspirin 325 MG EC  tablet Take 1 tablet (325 mg total) by mouth daily. Start taking on:  07/19/2016   cholecalciferol 400 units Tabs tablet Commonly known as:  VITAMIN D Take 800 Units by mouth daily.   DAILY-VITE PO Take 1 tablet by mouth daily.   donepezil 5 MG tablet Commonly known as:  ARICEPT TAKE 1 TABLET BY MOUTH AT BEDTIME.   ICAPS AREDS FORMULA PO Take 1 tablet by mouth daily.   levothyroxine 25 MCG tablet Commonly known as:  SYNTHROID, LEVOTHROID Take 25 mcg by mouth daily.   LORazepam 0.5 MG tablet Commonly known as:  ATIVAN Take 0.5 mg by mouth 2 (two) times daily.   memantine 10 MG tablet Commonly known as:  NAMENDA Take 10 mg by mouth 2 (two) times daily.   mirtazapine 7.5 MG tablet Commonly known as:  REMERON Take 7.5 mg by mouth at bedtime.   sertraline 50 MG tablet Commonly known as:  ZOLOFT Take 75 mg by mouth at bedtime.   simvastatin 20 MG tablet Commonly known as:  ZOCOR Take 20 mg by mouth at bedtime.   sulfamethoxazole-trimethoprim 400-80 MG tablet Commonly known as:  BACTRIM,SEPTRA Take 1 tablet by mouth every 12 (twelve) hours.       LABORATORY STUDIES CBC    Component Value Date/Time   WBC 7.4 07/14/2016  0927   RBC 5.04 07/14/2016 0927   HGB 15.1 (H) 07/14/2016 0927   HCT 47.0 (H) 07/14/2016 0927   PLT 317 07/14/2016 0927   MCV 93.3 07/14/2016 0927   MCH 30.0 07/14/2016 0927   MCHC 32.1 07/14/2016 0927   RDW 12.9 07/14/2016 0927   LYMPHSABS 1.9 07/14/2016 0927   MONOABS 0.5 07/14/2016 0927   EOSABS 0.2 07/14/2016 0927   BASOSABS 0.1 07/14/2016 0927   CMP    Component Value Date/Time   NA 140 07/14/2016 0927   K 3.8 07/14/2016 0927   CL 101 07/14/2016 0927   CO2 30 07/14/2016 0927   GLUCOSE 102 (H) 07/14/2016 0927   BUN 16 07/14/2016 0927   CREATININE 0.85 07/14/2016 0927   CALCIUM 9.8 07/14/2016 0927   PROT 7.9 07/14/2016 0927   ALBUMIN 4.4 07/14/2016 0927   AST 25 07/14/2016 0927   ALT 19 07/14/2016 0927   ALKPHOS 70 07/14/2016  0927   BILITOT 0.5 07/14/2016 0927   GFRNONAA >60 07/14/2016 0927   GFRAA >60 07/14/2016 0927   COAGS Lab Results  Component Value Date   INR 0.92 07/14/2016   INR 1.06 07/30/2011   Lipid Panel    Component Value Date/Time   CHOL 123 07/15/2016 0258   TRIG 102 07/15/2016 0258   HDL 55 07/15/2016 0258   CHOLHDL 2.2 07/15/2016 0258   VLDL 20 07/15/2016 0258   LDLCALC 48 07/15/2016 0258   HgbA1C  Lab Results  Component Value Date   HGBA1C 5.5 07/15/2016   Urinalysis    Component Value Date/Time   COLORURINE AMBER (A) 07/14/2016 1515   APPEARANCEUR CLOUDY (A) 07/14/2016 1515   LABSPEC 1.011 07/14/2016 1515   PHURINE 8.0 07/14/2016 1515   GLUCOSEU NEGATIVE 07/14/2016 1515   HGBUR SMALL (A) 07/14/2016 1515   BILIRUBINUR NEGATIVE 07/14/2016 1515   KETONESUR 5 (A) 07/14/2016 1515   PROTEINUR 30 (A) 07/14/2016 1515   UROBILINOGEN 0.2 11/02/2013 1320   NITRITE NEGATIVE 07/14/2016 1515   LEUKOCYTESUR NEGATIVE 07/14/2016 1515   Urine Drug Screen     Component Value Date/Time   LABOPIA NONE DETECTED 07/14/2016 1515   COCAINSCRNUR NONE DETECTED 07/14/2016 1515   LABBENZ POSITIVE (A) 07/14/2016 1515   AMPHETMU NONE DETECTED 07/14/2016 1515   THCU NONE DETECTED 07/14/2016 1515   LABBARB NONE DETECTED 07/14/2016 1515    Alcohol Level    Component Value Date/Time   ETH <5 07/14/2016 0927     SIGNIFICANT DIAGNOSTIC STUDIES Ct Head Wo Contrast 07/14/2016 1. No acute finding. ASPECTS is 10. 2. Remote lacunar infarct in the right thalamus.   Mr Brain Wo Contrast Mr Maxine GlennMra Head/brain Wo Cm 07/15/2016 1. Small acute left MCA infarct affecting the anterior insula. No associated hemorrhage or mass effect. 2. Normal MRA appearance of the anterior circulation, no left MCA abnormality identified. 3. Chronic right thalamic infarct is stable since 2015. 4. Possible chronic occlusion of the distal left vertebral artery, otherwise negative posterior circulation. 5. 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   2-D echocardiogram - Left ventricle: The cavity size was normal. Wall thickness was normal. Systolic function was normal. The estimated ejection fraction was in the range of 60% to 65%. Wall motion was normal; there were no regional wall motion abnormalities. Doppler parameters are consistent with abnormal left ventricular relaxation (grade 1 diastolic dysfunction). - Aortic valve: There was no stenosis. - Mitral valve: There was no significant regurgitation. - Right  ventricle: The cavity size was normal. Systolic function was normal. - Pulmonary arteries: No complete TR doppler jet so unable to estimate PA systolic pressure. - Inferior vena cava: The vessel was normal in size. The respirophasic diameter changes were in the normal range (>= 50%), consistent with normal central venous pressure. Impressions:   Normal LV size with EF 60-65%. Normal RV size and systolic function. No significant valvular abnormalities.    Carotid Doppler - The vertebral arteries appear patent with antegrade flow. - Findings consistent with 1-39 percent stenosis involving the right internal carotid artery and the left internal carotid artery.   HISTORY OF PRESENT ILLNESS Kimberly [name] is an 66 y.o. woman with PMHx of prior CVA, grade 1 diastolic CHF, hypothyroidism, depression, and blindness who presented to the ED 07/14/2016 after found to be choking on coffee at her assisted living facility. Per ED notes, facility staff reported the patient was "zoning out" and her face look twisted after the choking episode. Patient reportedly able to speak normally but has not been speaking since the episode. She was last seen normal around 7:10AM.  She has difficulty speaking but is able to say short words such as "yes" or "no" or "yes, ma'am." She answers questions appropriately nodding/shaking her head. She follows all commands. Denies any  weakness in her arms/legs. Modified Rankin: Rankin Score=4. She was administered IV TPA 07/14/2016 at 0938. She was admitted to the neuro ICU for further evaluation and treatment.   HOSPITAL COURSE [name] is a 66 y.o. female with history of prior stroke, grade 1 diastolic CHF, hypothyroidism, depression and blindness presenting with expressive aphasia. She received IV t-PA 07/14/2016 at 0938.   Stroke:  left insular infarct s/p IV tPA embolic secondary to unknown source. Suspicious for atrial fibrillation given age, but not confirmed. Stroke source unknown at time of discharge.  CT head no acute abnormality. ASPECTS 1  MRI  Small L insular infarct. Old R thalamic infarct. Chronic cyctic pituitary lesion unchanged since 2015  MRA  Normal.   Carotid Doppler  Bilat 1-39% ICA No significant stenosis   2D Echo  EF 60% to 65%.  no source of embolus  LDL 48  HgbA1c 5.5  No antithrombotic prior to admission, started on aspirin 325 mg daily for secondary stroke prevention  No further embolic workup as not a good anticoagulation candidate d/t memory loss, fall risk  Therapy recommendations:  SNF.   Disposition:  Plan return to ALF. They can provide care for her alone with HH (from [facility]) Dr. Pearlean Brownie attempted to contact her brother Orvilla Fus to discuss pending discharge but there was no answer  Hypertension  Stable, continue current treatment  Hyperlipidemia  Home meds:  zocor 20, resumed  LDL at goal  Continue statin at discharge  Other Stroke Risk Factors  Advanced age  UDS postive for Benzos  Former Cigarette smoker  Hx stroke/TIA ? Old R thalamic lacunar infarct  Hx diastolic CHF   UTI Urinary retention   urine culture positive for Klebsiella pneumonia. Sensitive to Bactrim  Started Bactrim 1. B.i.d. 07/17/2016 X 7 days   Residuals continue overnight. Required I&O cath.   Dr. Pearlean Brownie spoke with Dr. Elon Spanner  Will place foley and  discharge to ALF with it in place. Will add HHRN to [facility] follow up for any related needs. Dr. Elon Spanner office will follow up with ALF for OP follow up  Other Active Problems  Memory loss on donepezil and memantine  DISCHARGE EXAM Blood pressure 97/85, pulse 84, temperature 98.6 F (37 C), temperature source Oral, resp. rate 18, height 5\' 1"  (1.549 m), weight 61.5 kg (135 lb 9.3 oz), SpO2 94 %. Pleasant middle aged lady not in distress. . Afebrile. Head is nontraumatic. Neck is supple without bruit.    Cardiac exam no murmur or gallop. Lungs are clear to auscultation. Distal pulses are well felt. Neurological Exam ;  Awake alert oriented 3. Speech is slightly nonfluent with occasional word finding difficulties. No paraphasic errors. Able to name repeat and comprehend quite well. Extraocular movements are full range without nystagmus. Fundi were not visualized. Blinks to threat bilaterally. Face is asymmetric with mild right lower face weakness. Tongue is midline. Motor system exam reveals symmetric upper and lower extremity strength without focal weakness.Diminished fine finger movements on the right. Orbits left over right upper extremity. Deep tendon flexes are symmetric. Plantars are downgoing. Gait was not tested.  Discharge Diet   DIET DYS 2 Room service appropriate? Yes; Fluid consistency: Thin liquids  DISCHARGE PLAN  Disposition:  Return to ALF, [facility]  aspirin 325 mg daily for secondary stroke prevention.  Maintain Foley placed for urinary retention. Dr. Elon Spanner, Urologists will follow up with you. Notify him or home health RN of any issues  HH PT, OT, ST and RN  Ongoing risk factor control by Primary Care Physician at time of discharge  Follow-up SYLVIA, MICHAEL, MD in 2 weeks.  Follow-up with Dr. Delia Heady, Stroke Clinic in 6 weeks, office to schedule an appointment.  45 minutes were spent preparing discharge.  Rhoderick Moody [facility] See Amion  for Pager information 07/18/2016 11:36 AM   I have personally examined this patient, reviewed notes, independently viewed imaging studies, participated in medical decision making and plan of care.ROS completed by me personally and pertinent positives fully documented  I have made any additions or clarifications directly to the above note. Agree with note above.   Delia Heady, MD Medical Director [facility] Pager: [phone] 07/18/2016 12:35 PM

## 2016-07-18 NOTE — Progress Notes (Signed)
STROKE TEAM PROGRESS NOTE   SUBJECTIVE (INTERVAL HISTORY) She continues to have urinary retention requiring intermittent catheterization with high residual urine. She has UTI and is on bactrim OBJECTIVE Temp:  [98.1 F (36.7 C)-99.4 F (37.4 C)] 98.6 F (37 C) (02/23 0846) Pulse Rate:  [75-100] 84 (02/23 0846) Cardiac Rhythm: Normal sinus rhythm (02/23 0703) Resp:  [18-20] 18 (02/23 0846) BP: (97-130)/(52-85) 97/85 (02/23 0846) SpO2:  [91 %-97 %] 94 % (02/23 0846)  PHYSICAL EXAM Pleasant middle aged lady not in distress. . Afebrile. Head is nontraumatic. Neck is supple without bruit.    Cardiac exam no murmur or gallop. Lungs are clear to auscultation. Distal pulses are well felt. Neurological Exam ;  Awake alert oriented 3. Speech is slightly nonfluent with occasional word finding difficulties. No paraphasic errors. Able to name repeat and comprehend quite well. Extraocular movements are full range without nystagmus. Fundi were not visualized. Blinks to threat bilaterally. Face is asymmetric with mild right lower face weakness. Tongue is midline. Motor system exam reveals symmetric upper and lower extremity strength without focal weakness.Diminished fine finger movements on the right. Orbits left over right upper extremity. Deep tendon flexes are symmetric. Plantars are downgoing. Gait was not tested.  ASSESSMENT/PLAN Ms. DEAH OTTAWAY is a 66 y.o. female with history of prior stroke, grade 1 diastolic CHF, hypothyroidism, depression and blindness presenting with expressive aphasia. She received IV t-PA 07/14/2016 at 0938.   Stroke:  left insular infarct s/p IV tPA embolic secondary to unknown source. Suspicious for atrial fibrillation given age, but not confirmed.  CT head no acute abnormality. ASPECTS 1  MRI  Small L insular infarct. Old R thalamic infarct. Chronic cyctic pituitary lesion unchanged since 2015  MRA  Normal.   Carotid Doppler  Bilat 1-39% ICA No significant stenosis    2D Echo  EF 60% to 65%.  no source of embolus  LDL 48  HgbA1c 5.5  SCDs for VTE prophylaxis DIET DYS 2 Room service appropriate? Yes; Fluid consistency: Thin  No antithrombotic prior to admission, now on No antithrombotic as within 24 post tPA. Now on aspirin    No further embolic workup as not a good anticoagulation candidate d/t memory loss  Therapy recommendations:  SNF.   Disposition:  Plan return to ALF. They can provide care for her alone with HH (from [facility])  Hypertension  Stable  Long-term BP goal normotensive  Hyperlipidemia  Home meds:  zocor 20, resumed  LDL at goal  Continue statin at discharge  Other Stroke Risk Factors  Advanced age  UDS postive for Benzos  Former Cigarette smoker  Hx stroke/TIA  Old R thalamic lacunar infarct  Hx diastolic CHF   UTI Urinary retention   urine culture positive for Klebsiella pneumonia. Sensitivities pending   residual overnight. Required I&O cath.   monitor output as well as urinary retention today.   Start Bactrim 1. B.i.d. times 7 days. Follow-up sensitivities  I&O cath prn. Avoid foley. Discussed with RN  Other Active Problems  Memory loss on donepezil and memantine  Hospital day # 4  SETHI,PRAMOD  [facility] See Amion for Pager information 07/18/2016 10:30 AM  I have personally examined this patient, reviewed notes, independently viewed imaging studies, participated in medical decision making and plan of care.ROS completed by me personally and pertinent positives fully documented  I have made any additions or clarifications directly to the above note. . She presented with aphasia secondary to left hemispheric  embolic infarct etiology to be determined. She received IV tPA uneventfully and has shown significant neurological improvement.  . She is not a good long-term candidate for anticoagulation given her baseline dementia and will not consider doing TEE and loop  recorder. Continue aspirin for stroke prevention Discussed with Dr Britta MccreedyHemrich urologist on call who recommends placing foley catheter and having patient f/u with him next week in clinic.I called patient`s brother on listed number but unable to leave messageTransfer [facility].  Delia HeadyPramod Sethi, MD Medical Director [facility] Pager: [phone] 07/18/2016 10:30 AM  To contact Stroke Continuity provider, please refer to [email or web address]. After hours, contact General Neurology

## 2016-07-24 NOTE — Progress Notes (Signed)
07/14/16 1350  SLP Visit Information  SLP Received On 07/14/16  General Information  HPI Kimberly CaddySara L Collinsis a 66 y.o.femalewith a history of CVA, who presents to the Emergency Departmentvia EMS from an assisted living facility following choking episode this morning. Staff noted pt seemed to zone out and her face "looked twisted" following this choking episode. Pt is usually able to converse but has not been speaking since. Head CT negative.   Type of Study Bedside Swallow Evaluation  Previous Swallow Assessment none  Diet Prior to this Study NPO  Temperature Spikes Noted No  Respiratory Status Room air  History of Recent Intubation No  Behavior/Cognition Lethargic/Drowsy;Cooperative;Pleasant mood;Requires cueing  Oral Cavity Assessment WFL  Oral Care Completed by SLP Recent completion by staff  Oral Cavity - Dentition Adequate natural dentition  Vision Impaired for self-feeding  Self-Feeding Abilities Needs assist  Patient Positioning Upright in bed  Baseline Vocal Quality Low vocal intensity  Volitional Cough Cognitively unable to elicit  Volitional Swallow Unable to elicit  Pain Assessment  Pain Assessment No/denies pain  Oral Motor/Sensory Function  Overall Oral Motor/Sensory Function Moderate impairment  Facial ROM Reduced right;Suspected CN VII (facial) dysfunction  Facial Symmetry Abnormal symmetry right;Suspected CN VII (facial) dysfunction  Facial Strength Reduced right;Suspected CN VII (facial) dysfunction  Facial Sensation WFL  Lingual ROM Reduced right;Reduced left  Lingual Symmetry WFL  Lingual Strength Reduced  Lingual Sensation WFL  Velum WFL  Mandible WFL  Ice Chips  Ice chips NT  Thin Liquid  Thin Liquid Impaired  Presentation Cup;Self Fed;Straw  Oral Phase Impairments Impaired mastication  Oral Phase Functional Implications Prolonged oral transit;Oral holding (unable to suck through straw)  Pharyngeal  Phase Impairments Suspected delayed Swallow  Nectar  Thick Liquid  Nectar Thick Liquid NT  Honey Thick Liquid  Honey Thick Liquid NT  Puree  Puree Impaired  Presentation Spoon  Oral Phase Impairments Impaired mastication  Oral Phase Functional Implications Prolonged oral transit;Oral holding  Solid  Solid Impaired  Presentation Spoon  Oral Phase Impairments Impaired mastication;Poor awareness of bolus;Reduced lingual movement/coordination  Oral Phase Functional Implications Impaired mastication;Prolonged oral transit;Oral residue;Oral holding  SLP - End of Session  Patient left in bed;with call bell/phone within reach;with nursing in room  Nurse Communication Diet recommendation  SLP Assessment  Clinical Impression Statement (ACUTE ONLY) Patient presents with a moderate oropharyngeal dysphagia, exacerbated by altered mentation and lethargy.  Patient with right sided oral phase deficits, when combined with lethargy, decreased attention, and awareness, results in oral holding of bolus, prolonged oral transit, and oral residuals, often requiring cueing for oral transit of bolus and initiation of swallow increasing aspiration risk. Recommend NPO except meds crushed in puree. Potential to advance good. Will f/u.   SLP Visit Diagnosis Dysphagia, oropharyngeal phase (R13.12)  Impact on safety and function Moderate aspiration risk;Severe aspiration risk  Other Related Risk Factors Lethargy;Cognitive impairment  Swallow Evaluation Recommendations  SLP Diet Recommendations NPO except meds  Medication Administration Crushed with puree  Treatment Plan  Oral Care Recommendations Oral care QID  Treatment Recommendations Therapy as outlined in treatment plan below  Follow up Recommendations Skilled Nursing facility  Speech Therapy Frequency (ACUTE ONLY) min 2x/week  Treatment Duration 2 weeks  Interventions Aspiration precaution training;Compensatory techniques;Patient/family education;Trials of upgraded texture/liquids;Diet toleration management by  SLP  Prognosis  Prognosis for Safe Diet Advancement Good  Individuals Consulted  Consulted and Agree with Results and Recommendations Patient;RN  SLP Time Calculation  SLP Start Time (ACUTE ONLY) 1100  SLP Stop Time (ACUTE ONLY) 1125  SLP Time Calculation (min) (ACUTE ONLY) 25 min  SLP G-Codes **NOT FOR INPATIENT CLASS**  Functional Assessment Tool Used skilled clinical judgement  Functional Limitations Swallowing  Swallow Current Status (Z6109) CM  Swallow Goal Status (U0454) CL  SLP Evaluations  $ SLP Speech Visit 1 Procedure  SLP Evaluations  $BSS Swallow 1 Procedure  Ferdinand Lango MA, CCC-SLP [phone]

## 2016-07-24 NOTE — Progress Notes (Signed)
PT eval addendum- added G-codes    07/15/16 1549  PT G-Codes **NOT FOR INPATIENT CLASS**  Functional Assessment Tool Used Clinical judgement  Functional Limitation Mobility: Walking and moving around  Mobility: Walking and Moving Around Current Status (U9811(G8978) CJ  Mobility: Walking and Moving Around Goal Status (B1478(G8979) CI   Mylo RedShauna Aahana Elza, PT, DPT [phone]

## 2016-07-25 NOTE — Progress Notes (Signed)
Occupational Therapy Evaluation Addendum:    07/15/16 1300  OT G-codes **NOT FOR INPATIENT CLASS**  Functional Assessment Tool Used Clinical judgement  Functional Limitation Self care  Self Care Current Status [phone](G8987) CK  Self Care Goal Status (A2130(G8988) Trinna BalloonJ  Jamille Fisher, OTR/L [phone]

## 2016-09-03 ENCOUNTER — Encounter (INDEPENDENT_AMBULATORY_CARE_PROVIDER_SITE_OTHER): Payer: Self-pay

## 2016-09-03 ENCOUNTER — Ambulatory Visit (INDEPENDENT_AMBULATORY_CARE_PROVIDER_SITE_OTHER): Payer: Medicare Other | Admitting: Neurology

## 2016-09-03 ENCOUNTER — Encounter: Payer: Self-pay | Admitting: Neurology

## 2016-09-03 VITALS — BP 108/75 | HR 98 | Ht 61.0 in | Wt 139.0 lb

## 2016-09-03 DIAGNOSIS — I63412 Cerebral infarction due to embolism of left middle cerebral artery: Secondary | ICD-10-CM | POA: Diagnosis not present

## 2016-09-03 NOTE — Patient Instructions (Signed)
I had a long d/w patient and her aide about her recent stroke, risk for recurrent stroke/TIAs, personally independently reviewed imaging studies and stroke evaluation results and answered questions.Continue aspirin 325 mg daily  for secondary stroke prevention and maintain strict control of hypertension with blood pressure goal below 130/90, diabetes with hemoglobin A1c goal below 6.5% and lipids with LDL cholesterol goal below 70 mg/dL. I also advised the patient to  Aricept and Namenda and the current dosages for her dementia. She was advised to use a walker at all times and walk with assistance for fall and safety reasons. No routine schedule appointment with me is necessary in the future but she may be referred back as needed

## 2016-09-03 NOTE — Progress Notes (Signed)
[facility] [address] [place]. [place] [address] [phone]       OFFICE FOLLOW-UP NOTE  Ms. Rae Mar Date of Birth:  Oct 22, 1950 Medical Record Number:  [id number]   HPI: Ms Lemonds is a 89 year pleasant caucasian lady with baseline dementia and blindness who lives in a nursing facility was seen today for follow-up following hospital admission for stroke infiltrate 2018. She is accompanied tonight by her aide from [facility] nursing facility. History is obtained from the patient as well as review of electronic medical records. I have personally reviewed imaging films.Takeesha Isley Collinsis an 66 y.o.woman with PMHx of prior CVA, grade 1 diastolic CHF, hypothyroidism, depression, and blindness who presented to the ED 07/14/2016 after found to be choking on coffee at her assisted living facility. Per ED notes, facility staff reported the patient was "zoning out" and her face look twisted after the choking episode. Patient reportedly able to speak normally but has not been speaking since the episode. She was last seen normal around 7:10AM.  She has difficulty speaking but is able to say short words such as "yes" or "no" or "yes, ma'am." She answers questions appropriately nodding/shaking her head. She follows all commands. Denies any weakness in her arms/legs. Modified Rankin: Rankin Score=4. She was administered IV TPA 07/14/2016 at 0938. She was admitted to the neuro ICU for further evaluation and treatment. MRI scan of the brain showed acute infarct involving left insular cortex without any hemorrhage. There is a chronic cystic lesion noted in the pituitary gland which is unchanged compared with previous imaging study from 2015. MRI of the brain showed no large vessel stenosis and carotid ultrasound was unremarkable. Transthoracic echo showed normal ejection fraction. LDL cholesterol 48 mg and hemoglobin A1c was 5.5. Telemetry monitoring did not show paroxysmal atrial  fibrillation. Transesophageal echo and loop recorder were not done since patient was not a good long-term anticoagulation candidate due to her dementia as well as blindness and fall risk. The patient had urinary retention during the hospitalization and urine cultures were positive for Klebsiella. She was started on Bactrim. At the time of discharge her Foley catheter could not be removed as patient still had attention. Answers plan were to discharge the patient was interested in have outpatient follow-up with urology. She is apparently seen Dr. headache and will try removing the Foley catheter without success and she still has an indwelling Foley catheter. Patient is apparently back to baseline she has not noted worsening of her dementia or any new neurological symptoms. She is tolerating aspirin without bleeding or bruising. Her blood pressure is well controlled and today it is 108/70 She is able to granulate with the one person assist using a walker.  ROS:   14 system review of systems is positive for  urinary incontinence, blindness, memory loss, gait difficulty and all other systems negative  PMH:  Past Medical History:  Diagnosis Date  . Abnormality of gait 08/02/2013  . Anemia   . Arthritis    osteoarthritis  . Blind   . CHF (congestive heart failure) (HCC)   . COPD (chronic obstructive pulmonary disease) (HCC)   . DDD (degenerative disc disease)   . Depression   . HOH (hard of hearing)   . Hypothyroidism   . Memory disorder 08/02/2013  . Pneumonia   . Sepsis (HCC)   . Shortness of breath   . Stroke (HCC)   . Tobacco abuse 11/02/2013    Social History:  Social History  Social History  . Marital status: Divorced    Spouse name: N/A  . Number of children: 2  . Years of education: hs   Occupational History  . Disabled Not Employed   Social History Main Topics  . Smoking status: Former Smoker    Packs/day: 0.50    Types: Cigarettes  . Smokeless tobacco: Never Used  .  Alcohol use No  . Drug use: No  . Sexual activity: No   Other Topics Concern  . Not on file   Social History Narrative   Patient is single and lives at [place].   Patient has two adult children.   Patient is retired.   Patient has a high school education.   Patient is left-handed.   Patient drinks two cups of coffee and tea daily.    Medications:   Current Outpatient Prescriptions on File Prior to Visit  Medication Sig Dispense Refill  . acetaminophen (TYLENOL) 325 MG tablet Take 650 mg by mouth 2 (two) times daily.     . ARIPiprazole (ABILIFY) 2 MG tablet Take 2 mg by mouth at bedtime.    Marland Kitchen aspirin EC 325 MG EC tablet Take 1 tablet (325 mg total) by mouth daily. 30 tablet 0  . cholecalciferol (VITAMIN D) 400 UNITS TABS Take 800 Units by mouth daily.      Marland Kitchen donepezil (ARICEPT) 5 MG tablet TAKE 1 TABLET BY MOUTH AT BEDTIME. 7 tablet 0  . levothyroxine (SYNTHROID, LEVOTHROID) 25 MCG tablet Take 25 mcg by mouth daily.      Marland Kitchen LORazepam (ATIVAN) 0.5 MG tablet Take 0.5 mg by mouth 2 (two) times daily.     . memantine (NAMENDA) 10 MG tablet Take 10 mg by mouth 2 (two) times daily.    . Multiple Vitamin (DAILY-VITE PO) Take 1 tablet by mouth daily.    . Multiple Vitamins-Minerals (ICAPS AREDS FORMULA PO) Take 1 tablet by mouth daily.    . sertraline (ZOLOFT) 50 MG tablet Take 75 mg by mouth at bedtime.      No current facility-administered medications on file prior to visit.     Allergies:  No Known Allergies  Physical Exam General: well developed, well nourished, seated, in no evident distress.She has indwelling Foley catheter Head: head normocephalic and atraumatic.  Neck: supple with no carotid or supraclavicular bruits Cardiovascular: regular rate and rhythm, no murmurs Musculoskeletal: no deformity Skin:  no rash/petichiae Vascular:  Normal pulses all extremities Vitals:   09/03/16 1035  BP: 108/75  Pulse: 98   Neurologic Exam Mental Status: Awake and fully  alert. Disriented to place and time. Recent and remote memory poor. Attention span, concentration and fund of knowledge poor Mood and affect appropriate. Mini-Mental status exam not done. Follows simple midline and occasional one-step commands only Cranial Nerves: Fundoscopic exam not done. Pupils unequal, sluggishly reactive to light. Patient is legally blind with only light perception. She has nystagmus of blind Extraocular movements full  . Hearing intact. Facial sensation intact. Face, tongue, palate moves normally and symmetrically.  Motor: Normal bulk and tone. Normal strength in all tested extremity muscles. Sensory.: intact to touch ,pinprick .position and vibratory sensation.  Coordination: Rapid alternating movements normal in all extremities. Finger-to-nose and heel-to-shin performed accurately bilaterally. Gait and Station: Arises from chair with help. Stance is stooped. Gait  requires one-person assist and a walker demonstrates normal stride length and balance .   Reflexes: 1+ and symmetric. Toes downgoing.       ASSESSMENT: 52  year Caucasian lady with Advanced dementia and blindness with left MCA branch embolic infarct in February 2018 of undetermined etiology. History of baseline dementia and blindness and chronic urinary retention    PLAN: I had a long d/w patient and her aide about her recent stroke, risk for recurrent stroke/TIAs, personally independently reviewed imaging studies and stroke evaluation results and answered questions.Continue aspirin 325 mg daily  for secondary stroke prevention and maintain strict control of hypertension with blood pressure goal below 130/90, diabetes with hemoglobin A1c goal below 6.5% and lipids with LDL cholesterol goal below 70 mg/dL. I also advised the patient to  Aricept and Namenda and the current dosages for her dementia. She was advised to use a walker at all times and walk with assistance for fall and safety reasons. No routine schedule  appointment with me is necessary in the future but she may be referred back as needed Greater than 50% of time during this 25 minute visit was spent on counseling,explanation of diagnosis, planning of further management, discussion with patient and family and coordination of care Delia Heady, MD  [facility] [address] [place], [place] [address]  Phone [phone] Fax [phone] Note: This document was prepared with digital dictation and possible smart phrase technology. Any transcriptional errors that result from this process are unintentional

## 2016-09-15 NOTE — Progress Notes (Signed)
SLP Note (late entry):    07/15/16 1008  SLP G-Codes **NOT FOR INPATIENT CLASS**  Functional Assessment Tool Used skilled clinical judgement  Functional Limitations Spoken language expressive  Spoken Language Expression Current Status (Z6109) CK  Spoken Language Expression Goal Status (U0454) CJ  SLP Evaluations  $ SLP Speech Visit 1 Procedure  SLP Evaluations  $ SLP EVAL LANGUAGE/SOUND PRODUCTION 1 Procedure    [name], M.A. CCC-SLP [phone]

## 2016-10-27 ENCOUNTER — Other Ambulatory Visit: Payer: Self-pay

## 2016-10-27 NOTE — Patient Outreach (Signed)
Telephone outreach to patient to obtain mRs was successfully completed. mRs= 5. 

## 2016-12-29 ENCOUNTER — Emergency Department (HOSPITAL_COMMUNITY): Payer: Medicare Other

## 2016-12-29 ENCOUNTER — Encounter (HOSPITAL_COMMUNITY): Payer: Self-pay | Admitting: Emergency Medicine

## 2016-12-29 ENCOUNTER — Emergency Department (HOSPITAL_COMMUNITY)
Admission: EM | Admit: 2016-12-29 | Discharge: 2016-12-29 | Disposition: A | Discharge status: Skilled Nursing Facility | Payer: Medicare Other | Attending: Emergency Medicine | Admitting: Emergency Medicine

## 2016-12-29 DIAGNOSIS — E039 Hypothyroidism, unspecified: Secondary | ICD-10-CM | POA: Diagnosis not present

## 2016-12-29 DIAGNOSIS — I110 Hypertensive heart disease with heart failure: Secondary | ICD-10-CM | POA: Insufficient documentation

## 2016-12-29 DIAGNOSIS — Z7982 Long term (current) use of aspirin: Secondary | ICD-10-CM | POA: Diagnosis not present

## 2016-12-29 DIAGNOSIS — R112 Nausea with vomiting, unspecified: Secondary | ICD-10-CM | POA: Diagnosis not present

## 2016-12-29 DIAGNOSIS — Z87891 Personal history of nicotine dependence: Secondary | ICD-10-CM | POA: Insufficient documentation

## 2016-12-29 DIAGNOSIS — I5032 Chronic diastolic (congestive) heart failure: Secondary | ICD-10-CM | POA: Diagnosis not present

## 2016-12-29 DIAGNOSIS — Z79899 Other long term (current) drug therapy: Secondary | ICD-10-CM | POA: Insufficient documentation

## 2016-12-29 DIAGNOSIS — E860 Dehydration: Secondary | ICD-10-CM

## 2016-12-29 DIAGNOSIS — J449 Chronic obstructive pulmonary disease, unspecified: Secondary | ICD-10-CM | POA: Insufficient documentation

## 2016-12-29 DIAGNOSIS — T83511A Infection and inflammatory reaction due to indwelling urethral catheter, initial encounter: Secondary | ICD-10-CM | POA: Insufficient documentation

## 2016-12-29 DIAGNOSIS — R109 Unspecified abdominal pain: Secondary | ICD-10-CM | POA: Insufficient documentation

## 2016-12-29 DIAGNOSIS — N390 Urinary tract infection, site not specified: Secondary | ICD-10-CM

## 2016-12-29 DIAGNOSIS — Y732 Prosthetic and other implants, materials and accessory gastroenterology and urology devices associated with adverse incidents: Secondary | ICD-10-CM | POA: Diagnosis not present

## 2016-12-29 LAB — COMPREHENSIVE METABOLIC PANEL
ALBUMIN: 3.6 g/dL (ref 3.5–5.0)
ALK PHOS: 58 U/L (ref 38–126)
ALT: 19 U/L (ref 14–54)
ANION GAP: 9 (ref 5–15)
AST: 23 U/L (ref 15–41)
BILIRUBIN TOTAL: 0.4 mg/dL (ref 0.3–1.2)
BUN: 24 mg/dL — ABNORMAL HIGH (ref 6–20)
CALCIUM: 9.5 mg/dL (ref 8.9–10.3)
CO2: 27 mmol/L (ref 22–32)
Chloride: 105 mmol/L (ref 101–111)
Creatinine, Ser: 1.18 mg/dL — ABNORMAL HIGH (ref 0.44–1.00)
GFR, EST AFRICAN AMERICAN: 54 mL/min — AB (ref >60)
GFR, EST NON AFRICAN AMERICAN: 47 mL/min — AB (ref >60)
Glucose, Bld: 123 mg/dL — ABNORMAL HIGH (ref 65–99)
Potassium: 4.2 mmol/L (ref 3.5–5.1)
Sodium: 141 mmol/L (ref 135–145)
TOTAL PROTEIN: 7 g/dL (ref 6.5–8.1)

## 2016-12-29 LAB — URINALYSIS, ROUTINE W REFLEX MICROSCOPIC
Glucose, UA: NEGATIVE mg/dL
Ketones, ur: NEGATIVE mg/dL
NITRITE: NEGATIVE
Protein, ur: >300 mg/dL — AB
pH: >9 — ABNORMAL HIGH (ref 5.0–8.0)

## 2016-12-29 LAB — CBC WITH DIFFERENTIAL/PLATELET
BASOS PCT: 0 %
Basophils Absolute: 0 10*3/uL (ref 0.0–0.1)
EOS ABS: 0 10*3/uL (ref 0.0–0.7)
Eosinophils Relative: 0 %
HCT: 41.6 % (ref 36.0–46.0)
HEMOGLOBIN: 13.1 g/dL (ref 12.0–15.0)
Lymphocytes Relative: 7 %
Lymphs Abs: 1 10*3/uL (ref 0.7–4.0)
MCH: 28.2 pg (ref 26.0–34.0)
MCHC: 31.5 g/dL (ref 30.0–36.0)
MCV: 89.7 fL (ref 78.0–100.0)
Monocytes Absolute: 0.9 10*3/uL (ref 0.1–1.0)
Monocytes Relative: 6 %
NEUTROS PCT: 87 %
Neutro Abs: 13.3 10*3/uL — ABNORMAL HIGH (ref 1.7–7.7)
Platelets: 353 10*3/uL (ref 150–400)
RBC: 4.64 MIL/uL (ref 3.87–5.11)
RDW: 13.6 % (ref 11.5–15.5)
WBC: 15.2 10*3/uL — AB (ref 4.0–10.5)

## 2016-12-29 LAB — TROPONIN I: TROPONIN I: 0.05 ng/mL — AB (ref <0.03)

## 2016-12-29 LAB — URINALYSIS, MICROSCOPIC (REFLEX)

## 2016-12-29 LAB — LIPASE, BLOOD: Lipase: 39 U/L (ref 11–51)

## 2016-12-29 MED ORDER — SODIUM CHLORIDE 0.9 % IV SOLN
INTRAVENOUS | Status: DC
  Administered 2016-12-29: 12:00:00 via INTRAVENOUS

## 2016-12-29 MED ORDER — PIPERACILLIN-TAZOBACTAM 3.375 G IVPB 30 MIN
3.3750 g | Freq: Once | INTRAVENOUS | Status: AC
  Administered 2016-12-29: 3.375 g via INTRAVENOUS
  Filled 2016-12-29 (×2): qty 50

## 2016-12-29 MED ORDER — NITROFURANTOIN MONOHYD MACRO 100 MG PO CAPS: 100.0000 mg | ORAL_CAPSULE | Freq: Two times a day (BID) | ORAL | 0 refills | Status: DC

## 2016-12-29 MED ORDER — ONDANSETRON HCL 4 MG/2ML IJ SOLN
4.0000 mg | Freq: Once | INTRAMUSCULAR | Status: AC
  Administered 2016-12-29: 4 mg via INTRAVENOUS
  Filled 2016-12-29: qty 2

## 2016-12-29 MED ORDER — ONDANSETRON 4 MG PO TBDP: 4.0000 mg | ORAL_TABLET | Freq: Three times a day (TID) | ORAL | 0 refills | Status: DC | PRN

## 2016-12-29 MED ORDER — SODIUM CHLORIDE 0.9 % IV BOLUS (SEPSIS)
1000.0000 mL | Freq: Once | INTRAVENOUS | Status: AC
  Administered 2016-12-29: 1000 mL via INTRAVENOUS

## 2016-12-29 MED ORDER — CARRINGTON MOISTURE BARRIER EX CREA: TOPICAL_CREAM | CUTANEOUS | 0 refills | Status: DC | PRN

## 2016-12-29 MED ORDER — BARRIER CREAM NON-SPECIFIED
1.0000 "application " | TOPICAL_CREAM | Freq: Two times a day (BID) | TOPICAL | Status: DC | PRN
  Filled 2016-12-29: qty 1

## 2016-12-29 MED ORDER — FENTANYL CITRATE (PF) 100 MCG/2ML IJ SOLN
50.0000 ug | Freq: Once | INTRAMUSCULAR | Status: AC
  Administered 2016-12-29: 50 ug via INTRAVENOUS
  Filled 2016-12-29: qty 2

## 2016-12-29 MED ORDER — IOPAMIDOL (ISOVUE-300) INJECTION 61%
100.0000 mL | Freq: Once | INTRAVENOUS | Status: AC | PRN
  Administered 2016-12-29: 100 mL via INTRAVENOUS

## 2016-12-29 NOTE — ED Provider Notes (Addendum)
[facility]-EMERGENCY DEPT Provider Note   CSN: [id number] Arrival date & time: 12/29/16  1057     History   Chief Complaint Chief Complaint  Patient presents with  . Emesis    HPI Kimberly [name] is a 66 y.o. female.  Pt presents to the ED today with n/v and abdominal pain.  She was sent from [facility] b/c she had 2 episodes of vomiting.  Pt is a poor historian and is unable to contribute much to history.  Pt does have an indwelling foley due to urinary retention.      Past Medical History:  Diagnosis Date  . Abnormality of gait 08/02/2013  . Anemia   . Arthritis    osteoarthritis  . Blind   . CHF (congestive heart failure) (HCC)   . COPD (chronic obstructive pulmonary disease) (HCC)   . DDD (degenerative disc disease)   . Depression   . HOH (hard of hearing)   . Hypothyroidism   . Memory disorder 08/02/2013  . Pneumonia   . Sepsis (HCC)   . Shortness of breath   . Stroke (HCC)   . Tobacco abuse 11/02/2013    Patient Active Problem List   Diagnosis Date Noted  . Urinary retention 07/18/2016  . Essential hypertension 07/18/2016  . Hyperlipidemia 07/18/2016  . Ischemic stroke (HCC) - L insular embolic infarct s/p tPA, source unknown 07/14/2016  . Acute right hemiparesis (HCC)   . Dysarthria   . Dementia without behavioral disturbance   . Syncope 11/02/2013  . Syncope and collapse 11/02/2013  . Blind 11/02/2013  . Hypothyroidism 11/02/2013  . History of CVA (cerebrovascular accident) 11/02/2013  . Bradycardia 11/02/2013  . Tobacco abuse 11/02/2013  . Memory disorder 08/02/2013  . Abnormality of gait 08/02/2013  . Chronic diastolic heart failure (HCC) 12/19/2011  . ANEMIA 05/04/2009  . LEUKOCYTOSIS UNSPECIFIED 05/04/2009  . 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 ABDOMINAL PAIN, LOWER 05/04/2009    Past Surgical History:  Procedure Laterality Date  . ARM DEBRIDEMENT     as child  . CATARACT EXTRACTION W/PHACO  04/28/2011   Procedure:  CATARACT EXTRACTION PHACO AND INTRAOCULAR LENS PLACEMENT (IOC);  Surgeon: Susa Simmondsarroll F Haines;  Location: [facility];  Service: Ophthalmology;  Laterality: Left;  CDE: 5.37  . CESAREAN SECTION    . 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 STRABISMUS SURGERY     age 89    OB History    Gravida Para Term Preterm AB Living   2 2 2     2    SAB TAB Ectopic Multiple Live Births                   Home Medications    Prior to Admission medications   Medication Sig Start Date End Date Taking? Authorizing Provider  acetaminophen (TYLENOL) 325 MG tablet Take 650 mg by mouth 2 (two) times daily.    Yes [provider]  acetaminophen (TYLENOL) 650 MG CR tablet Take 650 mg by mouth 2 (two) times daily.   Yes [provider]  ARIPiprazole (ABILIFY) 2 MG tablet Take 2 mg by mouth at bedtime.   Yes [provider]  cholecalciferol (VITAMIN D) 400 UNITS TABS Take 800 Units by mouth daily.     Yes [provider]  donepezil (ARICEPT) 5 MG tablet TAKE 1 TABLET BY MOUTH AT BEDTIME. 01/15/15  Yes York SpanielWillis, Charles K, MD  levothyroxine (SYNTHROID, LEVOTHROID) 25 MCG tablet Take 25 mcg by mouth daily.     Yes [provider]  LORazepam (ATIVAN) 0.5 MG tablet Take 0.5 mg by mouth 2 (two) times daily.    Yes [provider]  memantine (NAMENDA) 10 MG tablet Take 10 mg by mouth 2 (two) times daily.   Yes [provider]  mirtazapine (REMERON) 15 MG tablet Take 7.5 mg by mouth at bedtime.  08/09/16  Yes [provider]  Multiple Vitamin (DAILY-VITE PO) Take 1 tablet by mouth daily.   Yes [provider]  Multiple Vitamins-Minerals (ICAPS AREDS FORMULA PO) Take 1 tablet by mouth daily.   Yes [provider]  sertraline (ZOLOFT) 50 MG tablet Take 75 mg by mouth at bedtime.    Yes [provider]  simvastatin (ZOCOR) 40 MG tablet Take 20  mg by mouth daily at 6 PM.  08/10/16  Yes [provider]  aspirin EC 325 MG EC tablet Take 1 tablet (325 mg total) by mouth daily. Patient not taking: Reported on 12/29/2016 07/19/16   Layne Benton, NP  nitrofurantoin, macrocrystal-monohydrate, (MACROBID) 100 MG capsule Take 1 capsule (100 mg total) by mouth 2 (two) times daily. 12/29/16   Jacalyn Lefevre, MD  ondansetron (ZOFRAN ODT) 4 MG disintegrating tablet Take 1 tablet (4 mg total) by mouth every 8 (eight) hours as needed. 12/29/16   Jacalyn Lefevre, MD    Family History Family History  Problem Relation Age of Onset  . Diabetes Brother   . Diabetes Brother   . Diabetes Mother   . Cancer - Lung Father   . Colon cancer Unknown        neg hx?     Social History Social History  Substance Use Topics  . Smoking status: Former Smoker    Packs/day: 0.50    Types: Cigarettes  . Smokeless tobacco: Never Used  . Alcohol use No     Allergies   Patient has no known allergies.   Review of Systems Review of Systems  Gastrointestinal: Positive for abdominal pain, nausea and vomiting.  All other systems reviewed and are negative.    Physical Exam Updated Vital Signs BP 110/75   Pulse (!) 107   Temp 98.6 F (37 C) (Oral)   Resp 20   Ht 5\' 6"  (1.676 m)   Wt 63.5 kg (140 lb)   SpO2 100%   BMI 22.60 kg/m   Physical Exam  Constitutional: She appears well-developed and well-nourished.  HENT:  Head: Normocephalic and atraumatic.  Right Ear: External ear normal.  Left Ear: External ear normal.  Nose: Nose normal.  Mouth/Throat: Mucous membranes are dry.  Eyes:  Pt is blind  Neck: Normal range of motion. Neck supple.  Cardiovascular: Normal rate, regular rhythm, normal heart sounds and intact distal pulses.   Pulmonary/Chest: Effort normal and breath sounds normal.  Abdominal: Soft. Bowel sounds are normal. There is tenderness in the suprapubic area.  Musculoskeletal: Normal range of motion.  Neurological: She is  alert.  Skin: Skin is warm.  Psychiatric: She has a normal mood and affect.  Nursing note and vitals reviewed.    ED Treatments / Results  Labs (all labs ordered are listed, but only abnormal results are displayed) Labs Reviewed  COMPREHENSIVE METABOLIC PANEL - Abnormal; Notable for the following:       Result Value   Glucose, Bld 123 (*)    BUN 24 (*)    Creatinine, Ser 1.18 (*)  GFR calc non Af Amer 47 (*)    GFR calc Af Amer 54 (*)    All other components within normal limits  CBC WITH DIFFERENTIAL/PLATELET - Abnormal; Notable for the following:    WBC 15.2 (*)    Neutro Abs 13.3 (*)    All other components within normal limits  URINALYSIS, ROUTINE W REFLEX MICROSCOPIC - Abnormal; Notable for the following:    Color, Urine BROWN (*)    APPearance TURBID (*)    Specific Gravity, Urine <1.005 (*)    pH >9.0 (*)    Hgb urine dipstick TRACE (*)    Bilirubin Urine SMALL (*)    Protein, ur >300 (*)    Leukocytes, UA LARGE (*)    All other components within normal limits  TROPONIN I - Abnormal; Notable for the following:    Troponin I 0.05 (*)    All other components within normal limits  URINALYSIS, MICROSCOPIC (REFLEX) - Abnormal; Notable for the following:    Bacteria, UA MANY (*)    Squamous Epithelial / LPF 0-5 (*)    Crystals TRIPLE PHOSPHATE CRYSTALS (*)    All other components within normal limits  URINE CULTURE  CULTURE, BLOOD (ROUTINE X 2)  CULTURE, BLOOD (ROUTINE X 2)  LIPASE, BLOOD    EKG  EKG Interpretation  Date/Time:  Monday December 29 2016 11:29:02 EDT Ventricular Rate:  96 PR Interval:    QRS Duration: 75 QT Interval:  353 QTC Calculation: 447 R Axis:   51 Text Interpretation:  Sinus rhythm Low voltage, precordial leads Confirmed by Jacalyn Lefevre [phone]) on 12/29/2016 12:23:00 PM       Radiology Ct Abdomen Pelvis W Contrast  Result Date: 12/29/2016 CLINICAL DATA:  66 year old female with history of vomiting this morning. EXAM: CT ABDOMEN  AND PELVIS WITH CONTRAST TECHNIQUE: Multidetector CT imaging of the abdomen and pelvis was performed using the standard protocol following bolus administration of intravenous contrast. CONTRAST:  ISOVUE-300 IOPAMIDOL (ISOVUE-300) INJECTION 61% COMPARISON:  CT the abdomen and pelvis 03/22/2011. FINDINGS: Lower chest: Unremarkable. Hepatobiliary: 5 mm low-attenuation lesion in segment 7 of the liver is too small to characterize, but is statistically likely a tiny cyst. No other suspicious appearing hepatic lesions are noted. No intra or extrahepatic biliary ductal dilatation. 11 mm rim calcified gallstone in the gallbladder. No findings to suggest an acute cholecystitis at this time. Pancreas: No pancreatic mass. No pancreatic ductal dilatation. No pancreatic or peripancreatic fluid or inflammatory changes. Spleen: Unremarkable. Adrenals/Urinary Tract: 1.9 cm simple cyst in the upper pole the left kidney. Other subcentimeter low-attenuation lesions in the left kidney are too small to definitively characterize, but are statistically likely tiny cysts. Mild right-sided hydroureteronephrosis which extends into the proximal anatomic pelvis where the ureter becomes indistinguishable from a soft tissue lesion measures approximately 3.0 x 1.8 x 2.5 cm (axial image 44 of series 2 and coronal image 42 of series 5) along the right pelvic side wall immediately adjacent to the bifurcation of the right common iliac artery, which appears to represent the patient's right ovary. No left-sided hydroureteronephrosis. Foley balloon catheter in the lumen of the urinary bladder which demonstrates markedly thickened trabeculated wall with avid mucosal enhancement, and surrounding inflammatory changes and trace volume of fluid in the immediate perivesical fat. Bilateral adrenal glands are normal in appearance. Stomach/Bowel: The appearance of the stomach is normal. There is no pathologic dilatation of small bowel or colon. Numerous  colonic diverticulae are noted, particularly in the descending colon  and sigmoid colon. No definite surrounding inflammatory changes to suggest an acute diverticulitis at this time. Vascular/Lymphatic: Aortic atherosclerosis, without evidence of aneurysm or dissection in the abdominal or pelvic vasculature. No definite lymphadenopathy identified in the abdomen or pelvis. Reproductive: Uterus and left ovary are atrophic. Right ovary is noted along the right pelvic side wall (as above) Other: Trace amount of perivesical fluid in the low anatomic pelvis. No larger volume of ascites. No pneumoperitoneum. Musculoskeletal: Chronic appearing compression fractures at T11 and T12, most severe T12 where there is 50% loss of anterior vertebral body height. There are no aggressive appearing lytic or blastic lesions noted in the visualized portions of the skeleton. IMPRESSION: 1. Urinary bladder appears markedly thick walled, heavily trabeculated, with increased enhancement of the mucosa, and extensive surrounding inflammatory changes, concerning for severe cystitis. There is some mild right-sided hydroureteronephrosis. Notably, there is an indwelling Foley catheter, which is unusual given the moderate distention of the urinary bladder at the time of the examination. Clinical correlation for potential catheter dysfunction is recommended. 2. Colonic diverticulosis without evidence of acute diverticulitis at this time. 3. Cholelithiasis without evidence of acute cholecystitis at this time. 4. Aortic atherosclerosis. Aortic Atherosclerosis (ICD10-I70.0). Electronically Signed   By: Trudie Reed M.D.   On: 12/29/2016 13:24    Procedures Procedures (including critical care time)  Medications Ordered in ED Medications  sodium chloride 0.9 % bolus 1,000 mL (1,000 mLs Intravenous New Bag/Given 12/29/16 1131)    And  0.9 %  sodium chloride infusion ( Intravenous New Bag/Given 12/29/16 1227)  piperacillin-tazobactam (ZOSYN)  IVPB 3.375 g (not administered)  ondansetron (ZOFRAN) injection 4 mg (4 mg Intravenous Given 12/29/16 1131)  fentaNYL (SUBLIMAZE) injection 50 mcg (50 mcg Intravenous Given 12/29/16 1131)  iopamidol (ISOVUE-300) 61 % injection 100 mL (100 mLs Intravenous Contrast Given 12/29/16 1240)  sodium chloride 0.9 % bolus 1,000 mL (1,000 mLs Intravenous New Bag/Given 12/29/16 1323)     Initial Impression / Assessment and Plan / ED Course  I have reviewed the triage vital signs and the nursing notes.  Pertinent labs & imaging results that were available during my care of the patient were reviewed by me and considered in my medical decision making (see chart for details).    Pt's foley was removed and a new one replaced.  The new one is draining urine much better.  The pt feels much better.  Last urine culture pan sensitive to everything but ampicillin.  I will start pt on macrobid and a new urine culture was sent.  Pt knows to return if worse.  F/u with pcp.  Pt's assisted living facility requested a home health consult.  That was put in Epic. Final Clinical Impressions(s) / ED Diagnoses   Final diagnoses:  Urinary tract infection associated with indwelling urethral catheter, initial encounter (HCC)  Dehydration  Non-intractable vomiting with nausea, unspecified vomiting type    New Prescriptions New Prescriptions   NITROFURANTOIN, MACROCRYSTAL-MONOHYDRATE, (MACROBID) 100 MG CAPSULE    Take 1 capsule (100 mg total) by mouth 2 (two) times daily.   ONDANSETRON (ZOFRAN ODT) 4 MG DISINTEGRATING TABLET    Take 1 tablet (4 mg total) by mouth every 8 (eight) hours as needed.     Jacalyn Lefevre, MD 12/29/16 1518    Jacalyn Lefevre, MD 12/29/16 [phone]

## 2016-12-29 NOTE — ED Notes (Signed)
Paged lab to come draw blood cultures on pt

## 2016-12-29 NOTE — ED Notes (Signed)
Date and time results received: 12/29/16 12:21 PM  (use smartphrase ".now" to insert current time)  Test: Troponin Critical Value: 0.05  Name of Provider Notified: [place]  Orders Received? Or Actions Taken?: Orders Received - See Orders for details

## 2016-12-29 NOTE — ED Notes (Signed)
Spoke with nursing home about having an order for home health and barrier cream. Spoke with Dr. Particia NearingHaviland about it.

## 2016-12-29 NOTE — ED Notes (Signed)
Pt.'s O2 after giving 50 of Fentayl dropped to low 80s and high 70s. Started pt on 2L Ashley and P2 has come back up to 96%

## 2016-12-29 NOTE — ED Notes (Signed)
Pt back from MRI 

## 2016-12-29 NOTE — ED Triage Notes (Signed)
Pt sent from [facility] for vomiting x 2 this am. Pt has no complaints at present.

## 2016-12-29 NOTE — ED Notes (Signed)
Pt to MRI

## 2016-12-29 NOTE — ED Notes (Signed)
Will start Zosyn once blood cultures have been drawn

## 2016-12-30 ENCOUNTER — Emergency Department (HOSPITAL_COMMUNITY): Payer: Medicare Other

## 2016-12-30 ENCOUNTER — Encounter (HOSPITAL_COMMUNITY): Payer: Self-pay | Admitting: *Deleted

## 2016-12-30 ENCOUNTER — Inpatient Hospital Stay (HOSPITAL_COMMUNITY)
Admission: EM | Admit: 2016-12-30 | Discharge: 2017-01-05 | DRG: 872 | Disposition: A | Discharge status: Skilled Nursing Facility | Payer: Medicare Other | Attending: Internal Medicine | Admitting: Internal Medicine

## 2016-12-30 DIAGNOSIS — R339 Retention of urine, unspecified: Secondary | ICD-10-CM | POA: Diagnosis present

## 2016-12-30 DIAGNOSIS — R7881 Bacteremia: Secondary | ICD-10-CM

## 2016-12-30 DIAGNOSIS — F0390 Unspecified dementia without behavioral disturbance: Secondary | ICD-10-CM | POA: Diagnosis present

## 2016-12-30 DIAGNOSIS — Z79899 Other long term (current) drug therapy: Secondary | ICD-10-CM | POA: Diagnosis not present

## 2016-12-30 DIAGNOSIS — J449 Chronic obstructive pulmonary disease, unspecified: Secondary | ICD-10-CM | POA: Diagnosis not present

## 2016-12-30 DIAGNOSIS — Z87891 Personal history of nicotine dependence: Secondary | ICD-10-CM | POA: Diagnosis not present

## 2016-12-30 DIAGNOSIS — I5032 Chronic diastolic (congestive) heart failure: Secondary | ICD-10-CM | POA: Diagnosis present

## 2016-12-30 DIAGNOSIS — Z8673 Personal history of transient ischemic attack (TIA), and cerebral infarction without residual deficits: Secondary | ICD-10-CM

## 2016-12-30 DIAGNOSIS — Z1624 Resistance to multiple antibiotics: Secondary | ICD-10-CM | POA: Diagnosis not present

## 2016-12-30 DIAGNOSIS — F329 Major depressive disorder, single episode, unspecified: Secondary | ICD-10-CM | POA: Diagnosis present

## 2016-12-30 DIAGNOSIS — R451 Restlessness and agitation: Secondary | ICD-10-CM | POA: Diagnosis present

## 2016-12-30 DIAGNOSIS — B952 Enterococcus as the cause of diseases classified elsewhere: Secondary | ICD-10-CM | POA: Diagnosis present

## 2016-12-30 DIAGNOSIS — H9190 Unspecified hearing loss, unspecified ear: Secondary | ICD-10-CM | POA: Diagnosis not present

## 2016-12-30 DIAGNOSIS — R531 Weakness: Secondary | ICD-10-CM

## 2016-12-30 DIAGNOSIS — Z8701 Personal history of pneumonia (recurrent): Secondary | ICD-10-CM

## 2016-12-30 DIAGNOSIS — Z833 Family history of diabetes mellitus: Secondary | ICD-10-CM

## 2016-12-30 DIAGNOSIS — N390 Urinary tract infection, site not specified: Secondary | ICD-10-CM | POA: Diagnosis present

## 2016-12-30 DIAGNOSIS — R55 Syncope and collapse: Secondary | ICD-10-CM | POA: Diagnosis not present

## 2016-12-30 DIAGNOSIS — I959 Hypotension, unspecified: Secondary | ICD-10-CM | POA: Diagnosis not present

## 2016-12-30 DIAGNOSIS — H547 Unspecified visual loss: Secondary | ICD-10-CM | POA: Diagnosis not present

## 2016-12-30 DIAGNOSIS — B9562 Methicillin resistant Staphylococcus aureus infection as the cause of diseases classified elsewhere: Secondary | ICD-10-CM

## 2016-12-30 DIAGNOSIS — E039 Hypothyroidism, unspecified: Secondary | ICD-10-CM | POA: Diagnosis not present

## 2016-12-30 DIAGNOSIS — E785 Hyperlipidemia, unspecified: Secondary | ICD-10-CM | POA: Diagnosis not present

## 2016-12-30 DIAGNOSIS — R413 Other amnesia: Secondary | ICD-10-CM | POA: Diagnosis present

## 2016-12-30 DIAGNOSIS — R509 Fever, unspecified: Secondary | ICD-10-CM | POA: Diagnosis present

## 2016-12-30 DIAGNOSIS — M1990 Unspecified osteoarthritis, unspecified site: Secondary | ICD-10-CM | POA: Diagnosis not present

## 2016-12-30 DIAGNOSIS — A419 Sepsis, unspecified organism: Secondary | ICD-10-CM | POA: Diagnosis not present

## 2016-12-30 LAB — URINALYSIS, ROUTINE W REFLEX MICROSCOPIC
BILIRUBIN URINE: NEGATIVE
Glucose, UA: NEGATIVE mg/dL
KETONES UR: NEGATIVE mg/dL
NITRITE: NEGATIVE
Protein, ur: >300 mg/dL — AB

## 2016-12-30 LAB — COMPREHENSIVE METABOLIC PANEL
ALT: 16 U/L (ref 14–54)
AST: 19 U/L (ref 15–41)
Albumin: 3.1 g/dL — ABNORMAL LOW (ref 3.5–5.0)
Alkaline Phosphatase: 49 U/L (ref 38–126)
Anion gap: 8 (ref 5–15)
BILIRUBIN TOTAL: 0.5 mg/dL (ref 0.3–1.2)
BUN: 14 mg/dL (ref 6–20)
CO2: 23 mmol/L (ref 22–32)
CREATININE: 0.84 mg/dL (ref 0.44–1.00)
Calcium: 8.8 mg/dL — ABNORMAL LOW (ref 8.9–10.3)
Chloride: 111 mmol/L (ref 101–111)
Glucose, Bld: 132 mg/dL — ABNORMAL HIGH (ref 65–99)
POTASSIUM: 3.3 mmol/L — AB (ref 3.5–5.1)
Sodium: 142 mmol/L (ref 135–145)
TOTAL PROTEIN: 6.2 g/dL — AB (ref 6.5–8.1)

## 2016-12-30 LAB — BLOOD CULTURE ID PANEL (REFLEXED)
Acinetobacter baumannii: NOT DETECTED
Candida albicans: NOT DETECTED
Candida glabrata: NOT DETECTED
Candida krusei: NOT DETECTED
Candida parapsilosis: NOT DETECTED
Candida tropicalis: NOT DETECTED
ENTEROCOCCUS SPECIES: NOT DETECTED
Enterobacter cloacae complex: NOT DETECTED
Enterobacteriaceae species: NOT DETECTED
Escherichia coli: NOT DETECTED
Haemophilus influenzae: NOT DETECTED
Klebsiella oxytoca: NOT DETECTED
Klebsiella pneumoniae: NOT DETECTED
LISTERIA MONOCYTOGENES: NOT DETECTED
Methicillin resistance: DETECTED — AB
NEISSERIA MENINGITIDIS: NOT DETECTED
PROTEUS SPECIES: NOT DETECTED
Pseudomonas aeruginosa: NOT DETECTED
SERRATIA MARCESCENS: NOT DETECTED
STAPHYLOCOCCUS AUREUS BCID: NOT DETECTED
STAPHYLOCOCCUS SPECIES: DETECTED — AB
STREPTOCOCCUS AGALACTIAE: NOT DETECTED
STREPTOCOCCUS SPECIES: NOT DETECTED
Streptococcus pneumoniae: NOT DETECTED
Streptococcus pyogenes: NOT DETECTED

## 2016-12-30 LAB — I-STAT CG4 LACTIC ACID, ED: LACTIC ACID, VENOUS: 1.92 mmol/L — AB (ref 0.5–1.9)

## 2016-12-30 LAB — CBC WITH DIFFERENTIAL/PLATELET
BASOS ABS: 0 10*3/uL (ref 0.0–0.1)
BASOS PCT: 0 %
EOS PCT: 1 %
Eosinophils Absolute: 0.1 10*3/uL (ref 0.0–0.7)
HCT: 36.6 % (ref 36.0–46.0)
Hemoglobin: 11.4 g/dL — ABNORMAL LOW (ref 12.0–15.0)
LYMPHS PCT: 11 %
Lymphs Abs: 1.3 10*3/uL (ref 0.7–4.0)
MCH: 28.3 pg (ref 26.0–34.0)
MCHC: 31.1 g/dL (ref 30.0–36.0)
MCV: 90.8 fL (ref 78.0–100.0)
MONO ABS: 1 10*3/uL (ref 0.1–1.0)
Monocytes Relative: 8 %
Neutro Abs: 9.2 10*3/uL — ABNORMAL HIGH (ref 1.7–7.7)
Neutrophils Relative %: 79 %
PLATELETS: 317 10*3/uL (ref 150–400)
RBC: 4.03 MIL/uL (ref 3.87–5.11)
RDW: 13.8 % (ref 11.5–15.5)
WBC: 11.6 10*3/uL — AB (ref 4.0–10.5)

## 2016-12-30 LAB — URINALYSIS, MICROSCOPIC (REFLEX): Squamous Epithelial / LPF: NONE SEEN

## 2016-12-30 LAB — PROTIME-INR
INR: 1.04
PROTHROMBIN TIME: 13.6 s (ref 11.4–15.2)

## 2016-12-30 LAB — TSH: TSH: 0.904 u[IU]/mL (ref 0.350–4.500)

## 2016-12-30 MED ORDER — LORAZEPAM 0.5 MG PO TABS
0.5000 mg | ORAL_TABLET | Freq: Two times a day (BID) | ORAL | Status: DC
  Administered 2016-12-30 – 2017-01-05 (×11): 0.5 mg via ORAL
  Filled 2016-12-30 (×13): qty 1

## 2016-12-30 MED ORDER — PIPERACILLIN-TAZOBACTAM 3.375 G IVPB 30 MIN
3.3750 g | Freq: Once | INTRAVENOUS | Status: AC
  Administered 2016-12-30: 3.375 g via INTRAVENOUS
  Filled 2016-12-30: qty 50

## 2016-12-30 MED ORDER — ADULT MULTIVITAMIN W/MINERALS CH
1.0000 | ORAL_TABLET | Freq: Every day | ORAL | Status: DC
  Administered 2016-12-30 – 2017-01-05 (×6): 1 via ORAL
  Filled 2016-12-30 (×7): qty 1

## 2016-12-30 MED ORDER — LEVOTHYROXINE SODIUM 25 MCG PO TABS
25.0000 ug | ORAL_TABLET | Freq: Every day | ORAL | Status: DC
  Administered 2016-12-30 – 2017-01-05 (×6): 25 ug via ORAL
  Filled 2016-12-30 (×7): qty 1

## 2016-12-30 MED ORDER — MIRTAZAPINE 15 MG PO TABS
7.5000 mg | ORAL_TABLET | Freq: Every day | ORAL | Status: DC
  Administered 2016-12-30 – 2017-01-04 (×6): 7.5 mg via ORAL
  Filled 2016-12-30 (×6): qty 1

## 2016-12-30 MED ORDER — ICAPS AREDS FORMULA PO TABS: ORAL_TABLET | Freq: Every day | ORAL | Status: DC

## 2016-12-30 MED ORDER — SODIUM CHLORIDE 0.9 % IV BOLUS (SEPSIS)
2000.0000 mL | Freq: Once | INTRAVENOUS | Status: AC
  Administered 2016-12-30: 2000 mL via INTRAVENOUS

## 2016-12-30 MED ORDER — SERTRALINE HCL 50 MG PO TABS
75.0000 mg | ORAL_TABLET | Freq: Every day | ORAL | Status: DC
  Administered 2016-12-30 – 2017-01-04 (×6): 75 mg via ORAL
  Filled 2016-12-30 (×6): qty 2

## 2016-12-30 MED ORDER — ACETAMINOPHEN 650 MG RE SUPP: 650.0000 mg | Freq: Four times a day (QID) | RECTAL | Status: DC | PRN

## 2016-12-30 MED ORDER — VANCOMYCIN HCL 500 MG IV SOLR
500.0000 mg | Freq: Two times a day (BID) | INTRAVENOUS | Status: DC
  Administered 2016-12-31 – 2017-01-01 (×5): 500 mg via INTRAVENOUS
  Filled 2016-12-30 (×12): qty 500

## 2016-12-30 MED ORDER — ARIPIPRAZOLE 2 MG PO TABS
2.0000 mg | ORAL_TABLET | Freq: Every day | ORAL | Status: DC
  Administered 2016-12-30 – 2017-01-04 (×6): 2 mg via ORAL
  Filled 2016-12-30 (×8): qty 1

## 2016-12-30 MED ORDER — MEMANTINE HCL 10 MG PO TABS
10.0000 mg | ORAL_TABLET | Freq: Two times a day (BID) | ORAL | Status: DC
  Administered 2016-12-30 – 2017-01-05 (×12): 10 mg via ORAL
  Filled 2016-12-30 (×13): qty 1

## 2016-12-30 MED ORDER — VANCOMYCIN HCL IN DEXTROSE 1-5 GM/200ML-% IV SOLN
1000.0000 mg | Freq: Once | INTRAVENOUS | Status: AC
  Administered 2016-12-30: 1000 mg via INTRAVENOUS
  Filled 2016-12-30 (×2): qty 200

## 2016-12-30 MED ORDER — SIMVASTATIN 20 MG PO TABS
20.0000 mg | ORAL_TABLET | Freq: Every day | ORAL | Status: DC
  Administered 2016-12-30 – 2017-01-04 (×4): 20 mg via ORAL
  Filled 2016-12-30 (×4): qty 1

## 2016-12-30 MED ORDER — DEXTROSE-NACL 5-0.9 % IV SOLN
INTRAVENOUS | Status: DC
  Administered 2016-12-30 – 2017-01-01 (×3): via INTRAVENOUS

## 2016-12-30 MED ORDER — DONEPEZIL HCL 5 MG PO TABS
5.0000 mg | ORAL_TABLET | Freq: Every day | ORAL | Status: DC
  Administered 2016-12-30 – 2017-01-04 (×6): 5 mg via ORAL
  Filled 2016-12-30 (×6): qty 1

## 2016-12-30 MED ORDER — HEPARIN SODIUM (PORCINE) 5000 UNIT/ML IJ SOLN
5000.0000 [IU] | Freq: Three times a day (TID) | INTRAMUSCULAR | Status: DC
  Administered 2016-12-30 – 2017-01-05 (×16): 5000 [IU] via SUBCUTANEOUS
  Filled 2016-12-30 (×19): qty 1

## 2016-12-30 MED ORDER — CHOLECALCIFEROL 10 MCG (400 UNIT) PO TABS
800.0000 [IU] | ORAL_TABLET | Freq: Every day | ORAL | Status: DC
  Administered 2016-12-30 – 2017-01-05 (×6): 800 [IU] via ORAL
  Filled 2016-12-30 (×7): qty 2

## 2016-12-30 MED ORDER — DEXTROSE 5 % IV SOLN
1.0000 g | INTRAVENOUS | Status: DC
  Administered 2016-12-30 – 2016-12-31 (×2): 1 g via INTRAVENOUS
  Filled 2016-12-30 (×2): qty 10

## 2016-12-30 MED ORDER — HYDROCERIN EX CREA
TOPICAL_CREAM | CUTANEOUS | Status: DC | PRN
  Filled 2016-12-30: qty 113

## 2016-12-30 MED ORDER — ACETAMINOPHEN 325 MG PO TABS
650.0000 mg | ORAL_TABLET | Freq: Four times a day (QID) | ORAL | Status: DC | PRN
  Filled 2016-12-30: qty 2

## 2016-12-30 MED ORDER — ACETAMINOPHEN 325 MG PO TABS
650.0000 mg | ORAL_TABLET | Freq: Two times a day (BID) | ORAL | Status: DC
  Administered 2016-12-30 – 2017-01-05 (×12): 650 mg via ORAL
  Filled 2016-12-30 (×27): qty 2

## 2016-12-30 NOTE — ED Notes (Signed)
CRITICAL VALUE ALERT  Critical Value:  Positive blood culutres, both bottles gram positive cocci.   Date & Time Notied:  12/30/2016, 1025  Provider Notified: Dr. Estell HarpinZammit  Orders Received/Actions taken: Antibiotics given

## 2016-12-30 NOTE — ED Provider Notes (Addendum)
[facility] Provider Note   CSN: [id number] Arrival date & time: 12/30/16  [id number]     History   Chief Complaint Chief Complaint  Patient presents with  . Weakness    HPI Kimberly [name] is a 66 y.o. female.  Patient brought in from nursing home because she slumped over the last week. She was seen yesterday in the emergency department with urinary tract infection.   The history is provided by the nursing home. No language interpreter was used.  Weakness  Primary symptoms include no focal weakness. This is a recurrent problem. The current episode started 3 to 5 hours ago. The problem has not changed since onset.There was no focality noted. The maximum temperature recorded prior to her arrival was 100 to 100.9 F. Pertinent negatives include no shortness of breath, no chest pain and no headaches. Meds prior to arrival: Antibiotics. Associated medical issues do not include trauma.    Past Medical History:  Diagnosis Date  . Abnormality of gait 08/02/2013  . Anemia   . Arthritis    osteoarthritis  . Blind   . CHF (congestive heart failure) (HCC)   . COPD (chronic obstructive pulmonary disease) (HCC)   . DDD (degenerative disc disease)   . Depression   . HOH (hard of hearing)   . Hypothyroidism   . Memory disorder 08/02/2013  . Pneumonia   . Sepsis (HCC)   . Shortness of breath   . Stroke (HCC)   . Tobacco abuse 11/02/2013    Patient Active Problem List   Diagnosis Date Noted  . Urinary retention 07/18/2016  . Essential hypertension 07/18/2016  . Hyperlipidemia 07/18/2016  . Ischemic stroke (HCC) - L insular embolic infarct s/p tPA, source unknown 07/14/2016  . Acute right hemiparesis (HCC)   . Dysarthria   . Dementia without behavioral disturbance   . Syncope 11/02/2013  . Syncope and collapse 11/02/2013  . Blind 11/02/2013  . Hypothyroidism 11/02/2013  . History of CVA (cerebrovascular accident) 11/02/2013  . Bradycardia 11/02/2013  . Tobacco abuse 11/02/2013   . Memory disorder 08/02/2013  . Abnormality of gait 08/02/2013  . Chronic diastolic heart failure (HCC) 12/19/2011  . ANEMIA 05/04/2009  . LEUKOCYTOSIS UNSPECIFIED 05/04/2009  . 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 ABDOMINAL PAIN, LOWER 05/04/2009    Past Surgical History:  Procedure Laterality Date  . ARM DEBRIDEMENT     as child  . CATARACT EXTRACTION W/PHACO  04/28/2011   Procedure: CATARACT EXTRACTION PHACO AND INTRAOCULAR LENS PLACEMENT (IOC);  Surgeon: Susa Simmondsarroll F Haines;  Location: [facility];  Service: Ophthalmology;  Laterality: Left;  CDE: 5.37  . CESAREAN SECTION    . 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 STRABISMUS SURGERY     age 89    OB History    Gravida Para Term Preterm AB Living   2 2 2     2    SAB TAB Ectopic Multiple Live Births                   Home Medications    Prior to Admission medications   Medication Sig Start Date End Date Taking? Authorizing Provider  acetaminophen (TYLENOL) 325 MG tablet Take 650 mg by mouth 2 (two) times daily.     [provider]  acetaminophen (TYLENOL) 650 MG CR tablet Take 650 mg by mouth 2 (two) times daily.    [provider]  ARIPiprazole (  ABILIFY) 2 MG tablet Take 2 mg by mouth at bedtime.    [provider]  aspirin EC 325 MG EC tablet Take 1 tablet (325 mg total) by mouth daily. Patient not taking: Reported on 12/29/2016 07/19/16   Layne Benton, NP  cholecalciferol (VITAMIN D) 400 UNITS TABS Take 800 Units by mouth daily.      [provider]  donepezil (ARICEPT) 5 MG tablet TAKE 1 TABLET BY MOUTH AT BEDTIME. 01/15/15   York Spaniel, MD  levothyroxine (SYNTHROID, LEVOTHROID) 25 MCG tablet Take 25 mcg by mouth daily.      [provider]  LORazepam (ATIVAN) 0.5 MG tablet Take 0.5 mg by mouth 2 (two) times daily.     [provider]  memantine (NAMENDA) 10 MG tablet  Take 10 mg by mouth 2 (two) times daily.    [provider]  mirtazapine (REMERON) 15 MG tablet Take 7.5 mg by mouth at bedtime.  08/09/16   [provider]  Multiple Vitamin (DAILY-VITE PO) Take 1 tablet by mouth daily.    [provider]  Multiple Vitamins-Minerals (ICAPS AREDS FORMULA PO) Take 1 tablet by mouth daily.    [provider]  nitrofurantoin, macrocrystal-monohydrate, (MACROBID) 100 MG capsule Take 1 capsule (100 mg total) by mouth 2 (two) times daily. 12/29/16   Jacalyn Lefevre, MD  ondansetron (ZOFRAN ODT) 4 MG disintegrating tablet Take 1 tablet (4 mg total) by mouth every 8 (eight) hours as needed. 12/29/16   Jacalyn Lefevre, MD  sertraline (ZOLOFT) 50 MG tablet Take 75 mg by mouth at bedtime.     [provider]  simvastatin (ZOCOR) 40 MG tablet Take 20 mg by mouth daily at 6 PM.  08/10/16   [provider]  Skin Protectants, Misc. (EUCERIN) cream Apply topically as needed for wound care. 12/29/16   Jacalyn Lefevre, MD    Family History Family History  Problem Relation Age of Onset  . Diabetes Brother   . Diabetes Brother   . Diabetes Mother   . Cancer - Lung Father   . Colon cancer Unknown        neg hx?     Social History Social History  Substance Use Topics  . Smoking status: Former Smoker    Packs/day: 0.50    Types: Cigarettes  . Smokeless tobacco: Never Used  . Alcohol use No     Allergies   Patient has no known allergies.   Review of Systems Review of Systems  Constitutional: Positive for fatigue. Negative for appetite change.  HENT: Negative for congestion, ear discharge and sinus pressure.   Eyes: Negative for discharge.  Respiratory: Negative for cough and shortness of breath.   Cardiovascular: Negative for chest pain.  Gastrointestinal: Negative for abdominal pain and diarrhea.  Genitourinary: Negative for frequency and hematuria.  Musculoskeletal: Negative for back pain.  Skin: Negative for  rash.  Neurological: Positive for weakness. Negative for focal weakness, seizures and headaches.  Psychiatric/Behavioral: Negative for hallucinations.     Physical Exam Updated Vital Signs BP 94/62 (BP Location: Left Arm)   Pulse (!) 105   Temp (!) 100.4 F (38 C) (Rectal)   Resp (!) 22   Ht 5\' 6"  (1.676 m)   Wt 63.5 kg (140 lb)   SpO2 91%   BMI 22.60 kg/m   Physical Exam  Constitutional: She appears well-developed.  HENT:  Head: Normocephalic.  Dry mucous membrane  Eyes: Conjunctivae and EOM are normal. No scleral icterus.  Neck: Neck supple. No thyromegaly present.  Cardiovascular: Normal rate and regular rhythm.  Exam reveals no gallop and no friction rub.   No murmur heard. Pulmonary/Chest: No stridor. She has no wheezes. She has no rales. She exhibits no tenderness.  Abdominal: She exhibits no distension. There is no tenderness. There is no rebound.  Musculoskeletal: Normal range of motion. She exhibits no edema.  Lymphadenopathy:    She has no cervical adenopathy.  Neurological: She exhibits normal muscle tone. Coordination normal.  Patient lethargic and only oriented to person  Skin: No rash noted. No erythema.     ED Treatments / Results  Labs (all labs ordered are listed, but only abnormal results are displayed) Labs Reviewed  I-STAT CG4 LACTIC ACID, ED - Abnormal; Notable for the following:       Result Value   Lactic Acid, Venous 1.92 (*)    All other components within normal limits  CULTURE, BLOOD (ROUTINE X 2)  CULTURE, BLOOD (ROUTINE X 2)  COMPREHENSIVE METABOLIC PANEL  CBC WITH DIFFERENTIAL/PLATELET  PROTIME-INR  URINALYSIS, ROUTINE W REFLEX MICROSCOPIC    EKG  EKG Interpretation None       Radiology Ct Abdomen Pelvis W Contrast  Result Date: 12/29/2016 CLINICAL DATA:  66 year old female with history of vomiting this morning. EXAM: CT ABDOMEN AND PELVIS WITH CONTRAST TECHNIQUE: Multidetector CT imaging of the abdomen and pelvis was  performed using the standard protocol following bolus administration of intravenous contrast. CONTRAST:  ISOVUE-300 IOPAMIDOL (ISOVUE-300) INJECTION 61% COMPARISON:  CT the abdomen and pelvis 03/22/2011. FINDINGS: Lower chest: Unremarkable. Hepatobiliary: 5 mm low-attenuation lesion in segment 7 of the liver is too small to characterize, but is statistically likely a tiny cyst. No other suspicious appearing hepatic lesions are noted. No intra or extrahepatic biliary ductal dilatation. 11 mm rim calcified gallstone in the gallbladder. No findings to suggest an acute cholecystitis at this time. Pancreas: No pancreatic mass. No pancreatic ductal dilatation. No pancreatic or peripancreatic fluid or inflammatory changes. Spleen: Unremarkable. Adrenals/Urinary Tract: 1.9 cm simple cyst in the upper pole the left kidney. Other subcentimeter low-attenuation lesions in the left kidney are too small to definitively characterize, but are statistically likely tiny cysts. Mild right-sided hydroureteronephrosis which extends into the proximal anatomic pelvis where the ureter becomes indistinguishable from a soft tissue lesion measures approximately 3.0 x 1.8 x 2.5 cm (axial image 44 of series 2 and coronal image 42 of series 5) along the right pelvic side wall immediately adjacent to the bifurcation of the right common iliac artery, which appears to represent the patient's right ovary. No left-sided hydroureteronephrosis. Foley balloon catheter in the lumen of the urinary bladder which demonstrates markedly thickened trabeculated wall with avid mucosal enhancement, and surrounding inflammatory changes and trace volume of fluid in the immediate perivesical fat. Bilateral adrenal glands are normal in appearance. Stomach/Bowel: The appearance of the stomach is normal. There is no pathologic dilatation of small bowel or colon. Numerous colonic diverticulae are noted, particularly in the descending colon and sigmoid colon. No  definite surrounding inflammatory changes to suggest an acute diverticulitis at this time. Vascular/Lymphatic: Aortic atherosclerosis, without evidence of aneurysm or dissection in the abdominal or pelvic vasculature. No definite lymphadenopathy identified in the abdomen or pelvis. Reproductive: Uterus and left ovary are atrophic. Right ovary is noted along the right pelvic side wall (as above) Other: Trace amount of perivesical fluid in the low anatomic pelvis. No larger volume of ascites. No pneumoperitoneum. Musculoskeletal: Chronic appearing compression fractures at  T11 and T12, most severe T12 where there is 50% loss of anterior vertebral body height. There are no aggressive appearing lytic or blastic lesions noted in the visualized portions of the skeleton. IMPRESSION: 1. Urinary bladder appears markedly thick walled, heavily trabeculated, with increased enhancement of the mucosa, and extensive surrounding inflammatory changes, concerning for severe cystitis. There is some mild right-sided hydroureteronephrosis. Notably, there is an indwelling Foley catheter, which is unusual given the moderate distention of the urinary bladder at the time of the examination. Clinical correlation for potential catheter dysfunction is recommended. 2. Colonic diverticulosis without evidence of acute diverticulitis at this time. 3. Cholelithiasis without evidence of acute cholecystitis at this time. 4. Aortic atherosclerosis. Aortic Atherosclerosis (ICD10-I70.0). Electronically Signed   By: Trudie Reed M.D.   On: 12/29/2016 13:24    Procedures Procedures (including critical care time)  Medications Ordered in ED Medications  sodium chloride 0.9 % bolus 2,000 mL (not administered)  vancomycin (VANCOCIN) IVPB 1000 mg/200 mL premix (not administered)  piperacillin-tazobactam (ZOSYN) IVPB 3.375 g (not administered)     Initial Impression / Assessment and Plan / ED Course  I have reviewed the triage vital signs and  the nursing notes.  Pertinent labs & imaging results that were available during my care of the patient were reviewed by me and considered in my medical decision making (see chart for details).     Patient with urinary tract infection and possible sepsis. Patient will be admitted for IV antibiotics. Blood cultures were not done for the sepsis protocol because the patient had 2 blood cultures yesterday along with the urine culture CRITICAL CARE Performed by: Keil Pickering L Total critical care time: 34 minutes Critical care time was exclusive of separately billable procedures and treating other patients. Critical care was necessary to treat or prevent imminent or life-threatening deterioration. Critical care was time spent personally by me on the following activities: development of treatment plan with patient and/or surrogate as well as nursing, discussions with consultants, evaluation of patient's response to treatment, examination of patient, obtaining history from patient or surrogate, ordering and performing treatments and interventions, ordering and review of laboratory studies, ordering and review of radiographic studies, pulse oximetry and re-evaluation of patient's condition.  Final Clinical Impressions(s) / ED Diagnoses   Final diagnoses:  None    New Prescriptions New Prescriptions   No medications on file     Bethann Berkshire, MD 12/30/16 1139    Bethann Berkshire, MD 12/30/16 1140

## 2016-12-30 NOTE — ED Notes (Signed)
Attempted to call report

## 2016-12-30 NOTE — Progress Notes (Addendum)
PHARMACY - PHYSICIAN COMMUNICATION CRITICAL VALUE ALERT - BLOOD CULTURE IDENTIFICATION (BCID)  Results for orders placed or performed during the hospital encounter of 12/29/16  Blood Culture ID Panel (Reflexed) (Collected: 12/29/2016  3:08 PM)  Result Value Ref Range   Enterococcus species NOT DETECTED NOT DETECTED   Listeria monocytogenes NOT DETECTED NOT DETECTED   Staphylococcus species DETECTED (A) NOT DETECTED   Staphylococcus aureus NOT DETECTED NOT DETECTED   Methicillin resistance DETECTED (A) NOT DETECTED   Streptococcus species NOT DETECTED NOT DETECTED   Streptococcus agalactiae NOT DETECTED NOT DETECTED   Streptococcus pneumoniae NOT DETECTED NOT DETECTED   Streptococcus pyogenes NOT DETECTED NOT DETECTED   Acinetobacter baumannii NOT DETECTED NOT DETECTED   Enterobacteriaceae species NOT DETECTED NOT DETECTED   Enterobacter cloacae complex NOT DETECTED NOT DETECTED   Escherichia coli NOT DETECTED NOT DETECTED   Klebsiella oxytoca NOT DETECTED NOT DETECTED   Klebsiella pneumoniae NOT DETECTED NOT DETECTED   Proteus species NOT DETECTED NOT DETECTED   Serratia marcescens NOT DETECTED NOT DETECTED   Haemophilus influenzae NOT DETECTED NOT DETECTED   Neisseria meningitidis NOT DETECTED NOT DETECTED   Pseudomonas aeruginosa NOT DETECTED NOT DETECTED   Candida albicans NOT DETECTED NOT DETECTED   Candida glabrata NOT DETECTED NOT DETECTED   Candida krusei NOT DETECTED NOT DETECTED   Candida parapsilosis NOT DETECTED NOT DETECTED   Candida tropicalis NOT DETECTED NOT DETECTED    Name of physician (or Provider) Contacted: Dr. Conley RollsLe  A/P: Received 1 dose Vancomycin and Zosyn in ED. Patient continued inpatient with ceftriaxone for UTI. BCID reported + GPC, mec + in 2 of 2 BCX aerobic bottles. Discussed with MD and will continue Vancomycin. Follow up ID and sensitivities.  Vancomycin 500mg  IV q12h Goal tr 15-8020mcg/ml F/U cxs and clinical progress Monitor V/S, labs, and levels as  indicated Deescalate tx as indicated  Elder CyphersLorie Brigetta Beckstrom, BS Loura Backharm D, BCPS Clinical Pharmacist Pager [phone] 12/30/2016  3:05 PM

## 2016-12-30 NOTE — ED Triage Notes (Addendum)
[facility]. She was here yesterday and dx with a UTI. Pt was outside today and became weak and fell. Denies hitting her head. Pt is alert.

## 2016-12-30 NOTE — H&P (Signed)
History and Physical    Kimberly [name] ZOX:[id number] DOB: 14-Feb-1951 DOA: 12/30/2016  PCP: Pearson GrippeKim, James, MD  Patient coming from: SNF    Chief Complaint: dizziness and hypotension.   HPI: Kimberly MarSara L Crespi is an 66 y.o. female with hx of dementia, COPD, CHF, hypothyroidism, prior CVA, seen yesterday for UTI, given Macrodantin, returned to the ER with hypotension and fever.  Work up in the ER included mild leukocytosis with WBC of 12K, and K of 3.3 mE/L.  She was given IVF, and her BP responded.  Though she is confused with dementia, she is alert, and was able to answer questions.  She has a foley placed yesterday.  She was started on IV Van/Zosyn and hospitalist was asked to admit her for probably early sepsis with UTI.    ED Course:  See above.  Rewiew of Systems: Unable to obtain reliable ROS.  No abdominal pain, chest pain, or SOB.     Past Medical History:  Diagnosis Date  . Abnormality of gait 08/02/2013  . Anemia   . Arthritis    osteoarthritis  . Blind   . CHF (congestive heart failure) (HCC)   . COPD (chronic obstructive pulmonary disease) (HCC)   . DDD (degenerative disc disease)   . Depression   . HOH (hard of hearing)   . Hypothyroidism   . Memory disorder 08/02/2013  . Pneumonia   . Sepsis (HCC)   . Shortness of breath   . Stroke (HCC)   . Tobacco abuse 11/02/2013   Past Surgical History:  Procedure Laterality Date  . ARM DEBRIDEMENT     as child  . CATARACT EXTRACTION W/PHACO  04/28/2011   Procedure: CATARACT EXTRACTION PHACO AND INTRAOCULAR LENS PLACEMENT (IOC);  Surgeon: Susa Simmondsarroll F Haines;  Location: [facility];  Service: Ophthalmology;  Laterality: Left;  CDE: 5.37  . CESAREAN SECTION    . 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 STRABISMUS SURGERY     age 36     reports that she has quit smoking. Her smoking use included Cigarettes. She smoked 0.50 packs per day. She has  never used smokeless tobacco. She reports that she does not drink alcohol or use drugs.  No Known Allergies  Family History  Problem Relation Age of Onset  . Diabetes Brother   . Diabetes Brother   . Diabetes Mother   . Cancer - Lung Father   . Colon cancer Unknown        neg hx?      Prior to Admission medications   Medication Sig Start Date End Date Taking? Authorizing Provider  acetaminophen (TYLENOL) 325 MG tablet Take 325 mg by mouth daily.    Yes [provider]  acetaminophen (TYLENOL) 650 MG CR tablet Take 650 mg by mouth 2 (two) times daily.   Yes [provider]  ARIPiprazole (ABILIFY) 2 MG tablet Take 2 mg by mouth at bedtime.   Yes [provider]  cholecalciferol (VITAMIN D) 400 UNITS TABS Take 800 Units by mouth daily.     Yes [provider]  donepezil (ARICEPT) 5 MG tablet TAKE 1 TABLET BY MOUTH AT BEDTIME. 01/15/15  Yes [place] SpanielWillis, Charles K, MD  levothyroxine (SYNTHROID, LEVOTHROID) 25 MCG tablet Take 25 mcg by mouth daily.     Yes [provider]  LORazepam (ATIVAN) 0.5 MG tablet Take 0.5 mg by mouth 2 (two) times daily.  Yes [provider]  memantine (NAMENDA) 10 MG tablet Take 10 mg by mouth 2 (two) times daily.   Yes [provider]  mirtazapine (REMERON) 15 MG tablet Take 7.5 mg by mouth at bedtime.  08/09/16  Yes [provider]  Multiple Vitamin (DAILY-VITE PO) Take 1 tablet by mouth daily.   Yes [provider]  Multiple Vitamins-Minerals (ICAPS AREDS FORMULA PO) Take 1 tablet by mouth daily.   Yes [provider]  nitrofurantoin, macrocrystal-monohydrate, (MACROBID) 100 MG capsule Take 1 capsule (100 mg total) by mouth 2 (two) times daily. 12/29/16  Yes Jacalyn Lefevre, MD  ondansetron (ZOFRAN ODT) 4 MG disintegrating tablet Take 1 tablet (4 mg total) by mouth every 8 (eight) hours as needed. 12/29/16  Yes Jacalyn Lefevre, MD  sertraline (ZOLOFT) 50 MG tablet Take 75 mg by mouth  at bedtime.    Yes [provider]  simvastatin (ZOCOR) 40 MG tablet Take 20 mg by mouth daily at 6 PM.  08/10/16  Yes [provider]  Skin Protectants, Misc. (EUCERIN) cream Apply topically as needed for wound care. Patient not taking: Reported on 12/30/2016 12/29/16   Jacalyn Lefevre, MD    Physical Exam: Vitals:   12/30/16 1142 12/30/16 1145 12/30/16 1200 12/30/16 1230  BP:   124/65 108/63  Pulse:  89 89 85  Resp:  (!) 23 (!) 23 (!) 22  Temp: 98.4 F (36.9 C)     TempSrc: Oral     SpO2:  97% 99% 94%  Weight:      Height:          Constitutional: NAD, calm, comfortable Vitals:   12/30/16 1142 12/30/16 1145 12/30/16 1200 12/30/16 1230  BP:   124/65 108/63  Pulse:  89 89 85  Resp:  (!) 23 (!) 23 (!) 22  Temp: 98.4 F (36.9 C)     TempSrc: Oral     SpO2:  97% 99% 94%  Weight:      Height:       Eyes: PERRL, lids and conjunctivae normal ENMT: Mucous membranes are moist. Posterior pharynx clear of any exudate or lesions.Normal dentition.  Neck: normal, supple, no masses, no thyromegaly Respiratory: clear to auscultation bilaterally, no wheezing, no crackles. Normal respiratory effort. No accessory muscle use.  Cardiovascular: Regular rate and rhythm, no murmurs / rubs / gallops. No extremity edema. 2+ pedal pulses. No carotid bruits.  Abdomen: no tenderness, no masses palpated. No hepatosplenomegaly. Bowel sounds positive.  Musculoskeletal: no clubbing / cyanosis. No joint deformity upper and lower extremities. Good ROM, no contractures. Normal muscle tone.  Skin: no rashes, lesions, ulcers. No induration Neurologic: CN 2-12 grossly intact. Sensation intact, DTR normal. Strength 5/5 in all 4.  Psychiatric: Unable.    Labs on Admission: I have personally reviewed following labs and imaging studies  CBC:  Recent Labs Lab 12/29/16 1132 12/30/16 0951  WBC 15.2* 11.6*  NEUTROABS 13.3* 9.2*  HGB 13.1 11.4*  HCT 41.6 36.6  MCV 89.7 90.8  PLT 353 317    Basic Metabolic Panel:  Recent Labs Lab 12/29/16 1132 12/30/16 0951  NA 141 142  K 4.2 3.3*  CL 105 111  CO2 27 23  GLUCOSE 123* 132*  BUN 24* 14  CREATININE 1.18* 0.84  CALCIUM 9.5 8.8*   GFR: Estimated Creatinine Clearance: 60.8 mL/min (by C-G formula based on SCr of 0.84 mg/dL). Liver Function Tests:  Recent Labs Lab 12/29/16 1132 12/30/16 0951  AST 23 19  ALT 19  16  ALKPHOS 58 49  BILITOT 0.4 0.5  PROT 7.0 6.2*  ALBUMIN 3.6 3.1*    Recent Labs Lab 12/29/16 1132  LIPASE 39   Coagulation Profile:  Recent Labs Lab 12/30/16 0951  INR 1.04   Cardiac Enzymes:  Recent Labs Lab 12/29/16 1132  TROPONINI 0.05*   Urine analysis:    Component Value Date/Time   COLORURINE RED (A) 12/30/2016 0951   APPEARANCEUR CLOUDY (A) 12/30/2016 0951   LABSPEC <1.005 (L) 12/30/2016 0951   PHURINE >9.0 (H) 12/30/2016 0951   GLUCOSEU NEGATIVE 12/30/2016 0951   HGBUR LARGE (A) 12/30/2016 0951   BILIRUBINUR NEGATIVE 12/30/2016 0951   KETONESUR NEGATIVE 12/30/2016 0951   PROTEINUR >300 (A) 12/30/2016 0951   UROBILINOGEN 0.2 11/02/2013 1320   NITRITE NEGATIVE 12/30/2016 0951   LEUKOCYTESUR LARGE (A) 12/30/2016 0951   ) Recent Results (from the past 240 hour(s))  Blood culture (routine x 2)     Status: None (Preliminary result)   Collection Time: 12/29/16  3:08 PM  Result Value Ref Range Status   Specimen Description BLOOD RIGHT ARM  Final   Special Requests   Final    BOTTLES DRAWN AEROBIC ONLY Blood Culture results may not be optimal due to an inadequate volume of blood received in culture bottles   Culture  Setup Time   Final    GRAM POSITIVE COCCI Gram Stain Report Called to,Read Back By and Verified With: CARDWELL L. AT 1023A ON [id number] BY THOMPSON S. Organism ID to follow Performed at [facility], [address], [place], [place] [address]    Culture GRAM POSITIVE COCCI  Final   Report Status PENDING  Incomplete  Blood culture (routine x 2)      Status: None (Preliminary result)   Collection Time: 12/29/16  3:10 PM  Result Value Ref Range Status   Specimen Description BLOOD LEFT HAND  Final   Special Requests   Final    BOTTLES DRAWN AEROBIC ONLY Blood Culture results may not be optimal due to an inadequate volume of blood received in culture bottles   Culture  Setup Time   Final    GRAM POSITIVE COCCI Gram Stain Report Called to,Read Back By and Verified With: CARDWELL L. AT 1023A ON [id number] BY THOMPSON S.    Culture   Final    GRAM POSITIVE COCCI IDENTIFICATION TO FOLLOW Performed at [facility], [address], [place], [place] [address]    Report Status PENDING  Incomplete     Radiological Exams on Admission: Ct Abdomen Pelvis W Contrast  Result Date: 12/29/2016 CLINICAL DATA:  66 year old female with history of vomiting this morning. EXAM: CT ABDOMEN AND PELVIS WITH CONTRAST TECHNIQUE: Multidetector CT imaging of the abdomen and pelvis was performed using the standard protocol following bolus administration of intravenous contrast. CONTRAST:  ISOVUE-300 IOPAMIDOL (ISOVUE-300) INJECTION 61% COMPARISON:  CT the abdomen and pelvis 03/22/2011. FINDINGS: Lower chest: Unremarkable. Hepatobiliary: 5 mm low-attenuation lesion in segment 7 of the liver is too small to characterize, but is statistically likely a tiny cyst. No other suspicious appearing hepatic lesions are noted. No intra or extrahepatic biliary ductal dilatation. 11 mm rim calcified gallstone in the gallbladder. No findings to suggest an acute cholecystitis at this time. Pancreas: No pancreatic mass. No pancreatic ductal dilatation. No pancreatic or peripancreatic fluid or inflammatory changes. Spleen: Unremarkable. Adrenals/Urinary Tract: 1.9 cm simple cyst in the upper pole the left kidney. Other subcentimeter low-attenuation lesions in the left  kidney are too small to definitively characterize, but are statistically likely tiny cysts. Mild right-sided  hydroureteronephrosis which extends into the proximal anatomic pelvis where the ureter becomes indistinguishable from a soft tissue lesion measures approximately 3.0 x 1.8 x 2.5 cm (axial image 44 of series 2 and coronal image 42 of series 5) along the right pelvic side wall immediately adjacent to the bifurcation of the right common iliac artery, which appears to represent the patient's right ovary. No left-sided hydroureteronephrosis. Foley balloon catheter in the lumen of the urinary bladder which demonstrates markedly thickened trabeculated wall with avid mucosal enhancement, and surrounding inflammatory changes and trace volume of fluid in the immediate perivesical fat. Bilateral adrenal glands are normal in appearance. Stomach/Bowel: The appearance of the stomach is normal. There is no pathologic dilatation of small bowel or colon. Numerous colonic diverticulae are noted, particularly in the descending colon and sigmoid colon. No definite surrounding inflammatory changes to suggest an acute diverticulitis at this time. Vascular/Lymphatic: Aortic atherosclerosis, without evidence of aneurysm or dissection in the abdominal or pelvic vasculature. No definite lymphadenopathy identified in the abdomen or pelvis. Reproductive: Uterus and left ovary are atrophic. Right ovary is noted along the right pelvic side wall (as above) Other: Trace amount of perivesical fluid in the low anatomic pelvis. No larger volume of ascites. No pneumoperitoneum. Musculoskeletal: Chronic appearing compression fractures at T11 and T12, most severe T12 where there is 50% loss of anterior vertebral body height. There are no aggressive appearing lytic or blastic lesions noted in the visualized portions of the skeleton. IMPRESSION: 1. Urinary bladder appears markedly thick walled, heavily trabeculated, with increased enhancement of the mucosa, and extensive surrounding inflammatory changes, concerning for severe cystitis. There is some mild  right-sided hydroureteronephrosis. Notably, there is an indwelling Foley catheter, which is unusual given the moderate distention of the urinary bladder at the time of the examination. Clinical correlation for potential catheter dysfunction is recommended. 2. Colonic diverticulosis without evidence of acute diverticulitis at this time. 3. Cholelithiasis without evidence of acute cholecystitis at this time. 4. Aortic atherosclerosis. Aortic Atherosclerosis (ICD10-I70.0). Electronically Signed   By: Trudie Reed M.D.   On: 12/29/2016 13:24   Dg Chest Portable 1 View  Result Date: 12/30/2016 CLINICAL DATA:  Fever. EXAM: PORTABLE CHEST 1 VIEW COMPARISON:  Chest x-ray dated November 02, 2013. FINDINGS: The cardiomediastinal silhouette is normal in size. Normal pulmonary vascularity. No focal consolidation, pleural effusion, or pneumothorax. Bibasilar atelectasis. No acute osseous abnormality. IMPRESSION: No active cardiopulmonary disease. Electronically Signed   By: Obie Dredge M.D.   On: 12/30/2016 10:42    EKG: Independently reviewed  Assessment/Plan Active Problems:   Chronic diastolic heart failure (HCC)   Memory disorder   Syncope and collapse   Blind   Hypothyroidism   History of CVA (cerebrovascular accident)   Dementia without behavioral disturbance   Urinary retention   Hyperlipidemia   Sepsis (HCC)   PLAN:   Early sepsis:  I suspect it is due to UTI.  She was given IV Van/Zosyn in the ER, and I will change to IV Rocephin.  She is responding to IVF, but with hx of CHF (diastolic), we will be careful with IVF. Will admit her to telemetry.  Follow up with urine culture.  Dementia:  Stable.  Continue with meds.   Hypothyroidism: Continue with supplement.  Check TSH.  HLD:  Continue with statin.   DVT prophylaxis: SubQ Heparin.  Code Status: FULL CODE>  Family Communication: none at bedside.  Disposition Plan: SNF.  Consults called: None. Admission status: Inpatient.    Dvora Buitron MD FACP. [facility]  If 7PM-7AM, please contact night-coverage [email or web address] Password [email or web address]  12/30/2016, 2:19 PM

## 2016-12-30 NOTE — ED Notes (Signed)
[facility] to [facility], pt going to room 316.

## 2016-12-31 DIAGNOSIS — A419 Sepsis, unspecified organism: Secondary | ICD-10-CM | POA: Diagnosis not present

## 2016-12-31 DIAGNOSIS — I5032 Chronic diastolic (congestive) heart failure: Secondary | ICD-10-CM

## 2016-12-31 DIAGNOSIS — F0390 Unspecified dementia without behavioral disturbance: Secondary | ICD-10-CM

## 2016-12-31 DIAGNOSIS — R7881 Bacteremia: Secondary | ICD-10-CM

## 2016-12-31 MED ORDER — VANCOMYCIN HCL 500 MG IV SOLR
INTRAVENOUS | Status: AC
  Filled 2016-12-31: qty 500

## 2016-12-31 NOTE — Progress Notes (Addendum)
PROGRESS NOTE    Kimberly [name]  XLK:[id number] DOB: 1951-05-16 DOA: 12/30/2016 PCP: Pearson Grippe, MD     Brief Narrative:  66 year old woman admitted to the hospital on 8/7 due to hypotension. She was seen in the ED the day prior for a UTI and given Macrobid and however returned the next day due to hypotension and fever. She was found to have a mild leukocytosis with a WBC count of 3.3. Started on Rocephin and admission was requested for early sepsis due to UTI. Urine culture is positive for enterococcus and 2 out of 2 blood cultures are growing coag-negative staph BC ID panel is showing MRSA. Antibiotics have been transitioned over to vancomycin pending final culture data.   Assessment & Plan:   Active Problems:   Chronic diastolic heart failure (HCC)   Memory disorder   Syncope and collapse   Blind   Hypothyroidism   History of CVA (cerebrovascular accident)   Dementia without behavioral disturbance   Urinary retention   Hyperlipidemia   Sepsis (HCC)   MRSA bacteremia   Sepsis -Has culture positive UTI, however is also growing what appears to be MRSA in the blood. -Will request 2-D echo to look for vegetations and will determine final workup pending finalization of cultures. -Continue vancomycin for now.  Dementia -At baseline, without behavioral disturbances.  Hypothyroidism -Continue Synthroid  Hyperlipidemia -Continue statin   DVT prophylaxis: Subcutaneous heparin  Code Status: Full code  Family Communication: Patient only  Disposition Plan: To be determined  Consultants:  None  Procedures:  None  Antimicrobials:  Anti-infectives    Start     Dose/Rate Route Frequency Ordered Stop   12/30/16 2300  vancomycin (VANCOCIN) 500 mg in sodium chloride 0.9 % 100 mL IVPB     500 mg 100 mL/hr over 60 Minutes Intravenous Every 12 hours 12/30/16 1513     12/30/16 1800  cefTRIAXone (ROCEPHIN) 1 g in dextrose 5 % 50 mL IVPB  Status:  Discontinued     1 g 100 mL/hr over  30 Minutes Intravenous Every 24 hours 12/30/16 1321 12/31/16 1808   12/30/16 1015  vancomycin (VANCOCIN) IVPB 1000 mg/200 mL premix     1,000 mg 200 mL/hr over 60 Minutes Intravenous  Once 12/30/16 1000 12/30/16 1201   12/30/16 1015  piperacillin-tazobactam (ZOSYN) IVPB 3.375 g     3.375 g 100 mL/hr over 30 Minutes Intravenous  Once 12/30/16 1000 12/30/16 1059       Subjective: Anxious to be discharged home, has no complaints  Objective: Vitals:   12/30/16 2125 12/30/16 2140 12/31/16 0630 12/31/16 1801  BP:  112/63 130/72 (!) 149/85  Pulse:  82 80 90  Resp:  20 20 18   Temp:  98.3 F (36.8 C) 98.4 F (36.9 C) 99 F (37.2 C)  TempSrc:  Oral Oral Oral  SpO2: 92% 95% 96% 97%  Weight:      Height:        Intake/Output Summary (Last 24 hours) at 12/31/16 1810 Last data filed at 12/31/16 0348  Gross per 24 hour  Intake              740 ml  Output              750 ml  Net              -10 ml   Filed Weights   12/30/16 0937  Weight: 63.5 kg (140 lb)    Examination:  General exam: Alert, awake,  oriented x 3 Respiratory system: Clear to auscultation. Respiratory effort normal. Cardiovascular system:RRR. No murmurs, rubs, gallops. Gastrointestinal system: Abdomen is nondistended, soft and nontender. No organomegaly or masses felt. Normal bowel sounds heard. Central nervous system: Alert and oriented. No focal neurological deficits. Extremities: No C/C/E, +pedal pulses Skin: No rashes, lesions or ulcers Psychiatry: Judgement and insight appear normal. Mood & affect appropriate.     Data Reviewed: I have personally reviewed following labs and imaging studies  CBC:  Recent Labs Lab 12/29/16 1132 12/30/16 0951  WBC 15.2* 11.6*  NEUTROABS 13.3* 9.2*  HGB 13.1 11.4*  HCT 41.6 36.6  MCV 89.7 90.8  PLT 353 317   Basic Metabolic Panel:  Recent Labs Lab 12/29/16 1132 12/30/16 0951  NA 141 142  K 4.2 3.3*  CL 105 111  CO2 27 23  GLUCOSE 123* 132*  BUN 24* 14    CREATININE 1.18* 0.84  CALCIUM 9.5 8.8*   GFR: Estimated Creatinine Clearance: 60.8 mL/min (by C-G formula based on SCr of 0.84 mg/dL). Liver Function Tests:  Recent Labs Lab 12/29/16 1132 12/30/16 0951  AST 23 19  ALT 19 16  ALKPHOS 58 49  BILITOT 0.4 0.5  PROT 7.0 6.2*  ALBUMIN 3.6 3.1*    Recent Labs Lab 12/29/16 1132  LIPASE 39   No results for input(s): AMMONIA in the last 168 hours. Coagulation Profile:  Recent Labs Lab 12/30/16 0951  INR 1.04   Cardiac Enzymes:  Recent Labs Lab 12/29/16 1132  TROPONINI 0.05*   BNP (last 3 results) No results for input(s): PROBNP in the last 8760 hours. HbA1C: No results for input(s): HGBA1C in the last 72 hours. CBG: No results for input(s): GLUCAP in the last 168 hours. Lipid Profile: No results for input(s): CHOL, HDL, LDLCALC, TRIG, CHOLHDL, LDLDIRECT in the last 72 hours. Thyroid Function Tests:  Recent Labs  12/30/16 0951  TSH 0.904   Anemia Panel: No results for input(s): VITAMINB12, FOLATE, FERRITIN, TIBC, IRON, RETICCTPCT in the last 72 hours. Urine analysis:    Component Value Date/Time   COLORURINE RED (A) 12/30/2016 0951   APPEARANCEUR CLOUDY (A) 12/30/2016 0951   LABSPEC <1.005 (L) 12/30/2016 0951   PHURINE >9.0 (H) 12/30/2016 0951   GLUCOSEU NEGATIVE 12/30/2016 0951   HGBUR LARGE (A) 12/30/2016 0951   BILIRUBINUR NEGATIVE 12/30/2016 0951   KETONESUR NEGATIVE 12/30/2016 0951   PROTEINUR >300 (A) 12/30/2016 0951   UROBILINOGEN 0.2 11/02/2013 1320   NITRITE NEGATIVE 12/30/2016 0951   LEUKOCYTESUR LARGE (A) 12/30/2016 0951   Sepsis Labs: @LABRCNTIP (procalcitonin:4,lacticidven:4)  ) Recent Results (from the past 240 hour(s))  Urine culture     Status: Abnormal (Preliminary result)   Collection Time: 12/29/16  2:23 PM  Result Value Ref Range Status   Specimen Description URINE, CLEAN CATCH  Final   Special Requests NONE  Final   Culture >=100,000 COLONIES/mL ENTEROCOCCUS FAECALIS (A)   Final   Report Status PENDING  Incomplete  Blood culture (routine x 2)     Status: Abnormal (Preliminary result)   Collection Time: 12/29/16  3:08 PM  Result Value Ref Range Status   Specimen Description BLOOD RIGHT ARM  Final   Special Requests   Final    BOTTLES DRAWN AEROBIC ONLY Blood Culture results may not be optimal due to an inadequate volume of blood received in culture bottles   Culture  Setup Time   Final    GRAM POSITIVE COCCI Gram Stain Report Called to,Read Back By and Verified With:  CARDWELL L. AT 1023A ON [id number] BY THOMPSON S.    Culture (A)  Final    STAPHYLOCOCCUS SPECIES (COAGULASE NEGATIVE) SUSCEPTIBILITIES TO FOLLOW Performed at [facility], [address] [place], [place] [address]    Report Status PENDING  Incomplete  Blood Culture ID Panel (Reflexed)     Status: Abnormal   Collection Time: 12/29/16  3:08 PM  Result Value Ref Range Status   Enterococcus species NOT DETECTED NOT DETECTED Final   Listeria monocytogenes NOT DETECTED NOT DETECTED Final   Staphylococcus species DETECTED (A) NOT DETECTED Final    Comment: Methicillin (oxacillin) resistant coagulase negative staphylococcus. Possible blood culture contaminant (unless isolated from more than one blood culture draw or clinical case suggests pathogenicity). No antibiotic treatment is indicated for blood  culture contaminants. CRITICAL RESULT CALLED TO, READ BACK BY AND VERIFIED WITH: LBoykin Reaper. Poole Pharm.D. 14:45 12/30/16 ([email or web address])    Staphylococcus aureus NOT DETECTED NOT DETECTED Final   Methicillin resistance DETECTED (A) NOT DETECTED Final    Comment: CRITICAL RESULT CALLED TO, READ BACK BY AND VERIFIED WITH: LBoykin Reaper. Poole Pharm.D. 14:45 12/30/16 ([email or web address])    Streptococcus species NOT DETECTED NOT DETECTED Final   Streptococcus agalactiae NOT DETECTED NOT DETECTED Final   Streptococcus pneumoniae NOT DETECTED NOT DETECTED Final   Streptococcus pyogenes NOT DETECTED NOT DETECTED Final   Acinetobacter  baumannii NOT DETECTED NOT DETECTED Final   Enterobacteriaceae species NOT DETECTED NOT DETECTED Final   Enterobacter cloacae complex NOT DETECTED NOT DETECTED Final   Escherichia coli NOT DETECTED NOT DETECTED Final   Klebsiella oxytoca NOT DETECTED NOT DETECTED Final   Klebsiella pneumoniae NOT DETECTED NOT DETECTED Final   Proteus species NOT DETECTED NOT DETECTED Final   Serratia marcescens NOT DETECTED NOT DETECTED Final   Haemophilus influenzae NOT DETECTED NOT DETECTED Final   Neisseria meningitidis NOT DETECTED NOT DETECTED Final   Pseudomonas aeruginosa NOT DETECTED NOT DETECTED Final   Candida albicans NOT DETECTED NOT DETECTED Final   Candida glabrata NOT DETECTED NOT DETECTED Final   Candida krusei NOT DETECTED NOT DETECTED Final   Candida parapsilosis NOT DETECTED NOT DETECTED Final   Candida tropicalis NOT DETECTED NOT DETECTED Final    Comment: Performed at [facility], [address] [place], [place] [address]  Blood culture (routine x 2)     Status: Abnormal (Preliminary result)   Collection Time: 12/29/16  3:10 PM  Result Value Ref Range Status   Specimen Description BLOOD LEFT HAND  Final   Special Requests   Final    BOTTLES DRAWN AEROBIC ONLY Blood Culture results may not be optimal due to an inadequate volume of blood received in culture bottles   Culture  Setup Time   Final    GRAM POSITIVE COCCI Gram Stain Report Called to,Read Back By and Verified With: CARDWELL L. AT 1023A ON [id number] BY THOMPSON S.    Culture STAPHYLOCOCCUS SPECIES (COAGULASE NEGATIVE) (A)  Final   Report Status PENDING  Incomplete         Radiology Studies: Dg Chest Portable 1 View  Result Date: 12/30/2016 CLINICAL DATA:  Fever. EXAM: PORTABLE CHEST 1 VIEW COMPARISON:  Chest x-ray dated November 02, 2013. FINDINGS: The cardiomediastinal silhouette is normal in size. Normal pulmonary vascularity. No focal consolidation, pleural effusion, or pneumothorax. Bibasilar atelectasis. No  acute osseous abnormality. IMPRESSION: No active cardiopulmonary disease. Electronically Signed   By: Obie DredgeWilliam T Derry M.D.   On: 12/30/2016 10:42  Scheduled Meds: . acetaminophen  650 mg Oral BID  . ARIPiprazole  2 mg Oral QHS  . cholecalciferol  800 Units Oral Daily  . donepezil  5 mg Oral QHS  . heparin  5,000 Units Subcutaneous Q8H  . levothyroxine  25 mcg Oral QAC breakfast  . LORazepam  0.5 mg Oral BID  . memantine  10 mg Oral BID  . mirtazapine  7.5 mg Oral QHS  . multivitamin with minerals  1 tablet Oral Daily  . sertraline  75 mg Oral QHS  . simvastatin  20 mg Oral q1800   Continuous Infusions: . dextrose 5 % and 0.9% NaCl 50 mL/hr at 12/31/16 1116  . vancomycin Stopped (12/31/16 1216)     LOS: 0 days    Time spent: 30 minutes. Greater than 50% of this time was spent in direct contact with the patient coordinating care.     Chaya Jan, MD [facility] Pager [phone]  If 7PM-7AM, please contact night-coverage [email or web address] Password [email or web address] 12/31/2016, 6:10 PM

## 2017-01-01 ENCOUNTER — Observation Stay (HOSPITAL_COMMUNITY): Payer: Medicare Other

## 2017-01-01 DIAGNOSIS — A419 Sepsis, unspecified organism: Secondary | ICD-10-CM | POA: Diagnosis not present

## 2017-01-01 DIAGNOSIS — F0390 Unspecified dementia without behavioral disturbance: Secondary | ICD-10-CM | POA: Diagnosis not present

## 2017-01-01 LAB — BASIC METABOLIC PANEL
ANION GAP: 4 — AB (ref 5–15)
BUN: 9 mg/dL (ref 6–20)
CALCIUM: 8.1 mg/dL — AB (ref 8.9–10.3)
CO2: 26 mmol/L (ref 22–32)
CREATININE: 0.65 mg/dL (ref 0.44–1.00)
Chloride: 114 mmol/L — ABNORMAL HIGH (ref 101–111)
GFR calc non Af Amer: >60 mL/min (ref >60)
Glucose, Bld: 95 mg/dL (ref 65–99)
Potassium: 3.2 mmol/L — ABNORMAL LOW (ref 3.5–5.1)
SODIUM: 144 mmol/L (ref 135–145)

## 2017-01-01 LAB — CBC
HCT: 35.2 % — ABNORMAL LOW (ref 36.0–46.0)
Hemoglobin: 10.8 g/dL — ABNORMAL LOW (ref 12.0–15.0)
MCH: 27.6 pg (ref 26.0–34.0)
MCHC: 30.7 g/dL (ref 30.0–36.0)
MCV: 89.8 fL (ref 78.0–100.0)
PLATELETS: 299 10*3/uL (ref 150–400)
RBC: 3.92 MIL/uL (ref 3.87–5.11)
RDW: 13.8 % (ref 11.5–15.5)
WBC: 6.3 10*3/uL (ref 4.0–10.5)

## 2017-01-01 LAB — URINE CULTURE

## 2017-01-01 LAB — CULTURE, BLOOD (ROUTINE X 2)

## 2017-01-01 NOTE — Progress Notes (Signed)
PROGRESS NOTE    Kimberly [name]  ZOX:[id number] DOB: 06-24-1950 DOA: 12/30/2016 PCP: Pearson GrippeKim, James, MD     Brief Narrative:  66 year old woman admitted to the hospital on 8/7 due to hypotension. She was seen in the ED the day prior for a UTI and given Macrobid and however returned the next day due to hypotension and fever. She was found to have a mild leukocytosis with a WBC count of 13.3. Started on Rocephin and admission was requested for early sepsis due to UTI. Urine culture is positive for enterococcus and 2 out of 2 blood cultures are growing coag-negative staph. Antibiotics have been transitioned over to vancomycin.   Assessment & Plan:   Active Problems:   Chronic diastolic heart failure (HCC)   Memory disorder   Syncope and collapse   Blind   Hypothyroidism   History of CVA (cerebrovascular accident)   Dementia without behavioral disturbance   Urinary retention   Hyperlipidemia   Sepsis (HCC)   MRSA bacteremia   Sepsis -Has culture positive UTI (enterococcus), however is also growing what appears to be CNS in the blood. -Discussed case via phone with infectious diseases. They have recommended TEE to rule out endocarditis, also repeat blood cultures with placement of PICC and 2 versus 4 weeks of antibiotics pending results of TEE. -Discussed with cardiology. -Sepsis parameters have resolved.  Dementia -At baseline, without behavioral disturbances.  Hypothyroidism -Continue Synthroid  Hyperlipidemia -Continue statin   DVT prophylaxis: Subcutaneous heparin  Code Status: Full code  Family Communication: Patient only  Disposition Plan: To be determined  Consultants:  None  Procedures:  None  Antimicrobials:  Anti-infectives    Start     Dose/Rate Route Frequency Ordered Stop   12/30/16 2300  vancomycin (VANCOCIN) 500 mg in sodium chloride 0.9 % 100 mL IVPB     500 mg 100 mL/hr over 60 Minutes Intravenous Every 12 hours 12/30/16 1513     12/30/16 1800   cefTRIAXone (ROCEPHIN) 1 g in dextrose 5 % 50 mL IVPB  Status:  Discontinued     1 g 100 mL/hr over 30 Minutes Intravenous Every 24 hours 12/30/16 1321 12/31/16 1808   12/30/16 1015  vancomycin (VANCOCIN) IVPB 1000 mg/200 mL premix     1,000 mg 200 mL/hr over 60 Minutes Intravenous  Once 12/30/16 1000 12/30/16 1201   12/30/16 1015  piperacillin-tazobactam (ZOSYN) IVPB 3.375 g     3.375 g 100 mL/hr over 30 Minutes Intravenous  Once 12/30/16 1000 12/30/16 1059       Subjective: Sleepy, wants to be discharged home  Objective: Vitals:   12/31/16 2107 12/31/16 2142 01/01/17 0500 01/01/17 1300  BP:  129/81 (!) 148/82 140/76  Pulse:  85 83 76  Resp:  20 20 20   Temp:  98.3 F (36.8 C) 98 F (36.7 C) 98 F (36.7 C)  TempSrc:  Oral Oral Oral  SpO2: 93% 96% 95% 93%  Weight:      Height:        Intake/Output Summary (Last 24 hours) at 01/01/17 1626 Last data filed at 01/01/17 1200  Gross per 24 hour  Intake             1600 ml  Output              600 ml  Net             1000 ml   Filed Weights   12/30/16 0937  Weight: 63.5 kg (140 lb)  Examination:  General exam: Somnolent, however arouses and can answer questions appropriately. Respiratory system: Clear to auscultation. Respiratory effort normal. Cardiovascular system:RRR. No murmurs, rubs, gallops. Gastrointestinal system: Abdomen is nondistended, soft and nontender. No organomegaly or masses felt. Normal bowel sounds heard. Central nervous system: Drowsy but oriented when awakened. No focal neurological deficits. Extremities: No C/C/E, +pedal pulses Skin: No rashes, lesions or ulcers Psychiatry: Unable to fully assess given current mental state    Data Reviewed: I have personally reviewed following labs and imaging studies  CBC:  Recent Labs Lab 12/29/16 1132 12/30/16 0951 01/01/17 0554  WBC 15.2* 11.6* 6.3  NEUTROABS 13.3* 9.2*  --   HGB 13.1 11.4* 10.8*  HCT 41.6 36.6 35.2*  MCV 89.7 90.8 89.8  PLT  353 317 299   Basic Metabolic Panel:  Recent Labs Lab 12/29/16 1132 12/30/16 0951 01/01/17 0554  NA 141 142 144  K 4.2 3.3* 3.2*  CL 105 111 114*  CO2 27 23 26   GLUCOSE 123* 132* 95  BUN 24* 14 9  CREATININE 1.18* 0.84 0.65  CALCIUM 9.5 8.8* 8.1*   GFR: Estimated Creatinine Clearance: 63.9 mL/min (by C-G formula based on SCr of 0.65 mg/dL). Liver Function Tests:  Recent Labs Lab 12/29/16 1132 12/30/16 0951  AST 23 19  ALT 19 16  ALKPHOS 58 49  BILITOT 0.4 0.5  PROT 7.0 6.2*  ALBUMIN 3.6 3.1*    Recent Labs Lab 12/29/16 1132  LIPASE 39   No results for input(s): AMMONIA in the last 168 hours. Coagulation Profile:  Recent Labs Lab 12/30/16 0951  INR 1.04   Cardiac Enzymes:  Recent Labs Lab 12/29/16 1132  TROPONINI 0.05*   BNP (last 3 results) No results for input(s): PROBNP in the last 8760 hours. HbA1C: No results for input(s): HGBA1C in the last 72 hours. CBG: No results for input(s): GLUCAP in the last 168 hours. Lipid Profile: No results for input(s): CHOL, HDL, LDLCALC, TRIG, CHOLHDL, LDLDIRECT in the last 72 hours. Thyroid Function Tests:  Recent Labs  12/30/16 0951  TSH 0.904   Anemia Panel: No results for input(s): VITAMINB12, FOLATE, FERRITIN, TIBC, IRON, RETICCTPCT in the last 72 hours. Urine analysis:    Component Value Date/Time   COLORURINE RED (A) 12/30/2016 0951   APPEARANCEUR CLOUDY (A) 12/30/2016 0951   LABSPEC <1.005 (L) 12/30/2016 0951   PHURINE >9.0 (H) 12/30/2016 0951   GLUCOSEU NEGATIVE 12/30/2016 0951   HGBUR LARGE (A) 12/30/2016 0951   BILIRUBINUR NEGATIVE 12/30/2016 0951   KETONESUR NEGATIVE 12/30/2016 0951   PROTEINUR >300 (A) 12/30/2016 0951   UROBILINOGEN 0.2 11/02/2013 1320   NITRITE NEGATIVE 12/30/2016 0951   LEUKOCYTESUR LARGE (A) 12/30/2016 0951   Sepsis Labs: @LABRCNTIP (procalcitonin:4,lacticidven:4)  ) Recent Results (from the past 240 hour(s))  Urine culture     Status: Abnormal   Collection  Time: 12/29/16  2:23 PM  Result Value Ref Range Status   Specimen Description URINE, CLEAN CATCH  Final   Special Requests NONE  Final   Culture >=100,000 COLONIES/mL ENTEROCOCCUS FAECALIS (A)  Final   Report Status 01/01/2017 FINAL  Final   Organism ID, Bacteria ENTEROCOCCUS FAECALIS (A)  Final      Susceptibility   Enterococcus faecalis - MIC*    AMPICILLIN <=2 SENSITIVE Sensitive     LEVOFLOXACIN >=8 RESISTANT Resistant     NITROFURANTOIN <=16 SENSITIVE Sensitive     VANCOMYCIN 1 SENSITIVE Sensitive     * >=100,000 COLONIES/mL ENTEROCOCCUS FAECALIS  Blood culture (routine x  2)     Status: Abnormal   Collection Time: 12/29/16  3:08 PM  Result Value Ref Range Status   Specimen Description BLOOD RIGHT ARM  Final   Special Requests   Final    BOTTLES DRAWN AEROBIC ONLY Blood Culture results may not be optimal due to an inadequate volume of blood received in culture bottles   Culture  Setup Time   Final    GRAM POSITIVE COCCI Gram Stain Report Called to,Read Back By and Verified With: CARDWELL L. AT 1023A ON [id number] BY THOMPSON S.    Culture STAPHYLOCOCCUS SPECIES (COAGULASE NEGATIVE) (A)  Final   Report Status 01/01/2017 FINAL  Final   Organism ID, Bacteria STAPHYLOCOCCUS SPECIES (COAGULASE NEGATIVE)  Final      Susceptibility   Staphylococcus species (coagulase negative) - MIC*    CIPROFLOXACIN 4 RESISTANT Resistant     ERYTHROMYCIN >=8 RESISTANT Resistant     GENTAMICIN <=0.5 SENSITIVE Sensitive     OXACILLIN >=4 RESISTANT Resistant     TETRACYCLINE 4 SENSITIVE Sensitive     VANCOMYCIN 1 SENSITIVE Sensitive     TRIMETH/SULFA 80 RESISTANT Resistant     CLINDAMYCIN RESISTANT Resistant     RIFAMPIN <=0.5 SENSITIVE Sensitive     Inducible Clindamycin POSITIVE Resistant     * STAPHYLOCOCCUS SPECIES (COAGULASE NEGATIVE)  Blood Culture ID Panel (Reflexed)     Status: Abnormal   Collection Time: 12/29/16  3:08 PM  Result Value Ref Range Status   Enterococcus species NOT DETECTED  NOT DETECTED Final   Listeria monocytogenes NOT DETECTED NOT DETECTED Final   Staphylococcus species DETECTED (A) NOT DETECTED Final    Comment: Methicillin (oxacillin) resistant coagulase negative staphylococcus. Possible blood culture contaminant (unless isolated from more than one blood culture draw or clinical case suggests pathogenicity). No antibiotic treatment is indicated for blood  culture contaminants. CRITICAL RESULT CALLED TO, READ BACK BY AND VERIFIED WITH: LBoykin Reaper.D. 14:45 12/30/16 ([email or web address])    Staphylococcus aureus NOT DETECTED NOT DETECTED Final   Methicillin resistance DETECTED (A) NOT DETECTED Final    Comment: CRITICAL RESULT CALLED TO, READ BACK BY AND VERIFIED WITH: LBoykin Reaper.D. 14:45 12/30/16 ([email or web address])    Streptococcus species NOT DETECTED NOT DETECTED Final   Streptococcus agalactiae NOT DETECTED NOT DETECTED Final   Streptococcus pneumoniae NOT DETECTED NOT DETECTED Final   Streptococcus pyogenes NOT DETECTED NOT DETECTED Final   Acinetobacter baumannii NOT DETECTED NOT DETECTED Final   Enterobacteriaceae species NOT DETECTED NOT DETECTED Final   Enterobacter cloacae complex NOT DETECTED NOT DETECTED Final   Escherichia coli NOT DETECTED NOT DETECTED Final   Klebsiella oxytoca NOT DETECTED NOT DETECTED Final   Klebsiella pneumoniae NOT DETECTED NOT DETECTED Final   Proteus species NOT DETECTED NOT DETECTED Final   Serratia marcescens NOT DETECTED NOT DETECTED Final   Haemophilus influenzae NOT DETECTED NOT DETECTED Final   Neisseria meningitidis NOT DETECTED NOT DETECTED Final   Pseudomonas aeruginosa NOT DETECTED NOT DETECTED Final   Candida albicans NOT DETECTED NOT DETECTED Final   Candida glabrata NOT DETECTED NOT DETECTED Final   Candida krusei NOT DETECTED NOT DETECTED Final   Candida parapsilosis NOT DETECTED NOT DETECTED Final   Candida tropicalis NOT DETECTED NOT DETECTED Final    Comment: Performed at [facility], [address]  [place], [place] [address]  Blood culture (routine x 2)     Status: Abnormal   Collection Time: 12/29/16  3:10 PM  Result Value Ref Range Status  Specimen Description BLOOD LEFT HAND  Final   Special Requests   Final    BOTTLES DRAWN AEROBIC ONLY Blood Culture results may not be optimal due to an inadequate volume of blood received in culture bottles   Culture  Setup Time   Final    GRAM POSITIVE COCCI Gram Stain Report Called to,Read Back By and Verified With: CARDWELL L. AT 1023A ON [id number] BY THOMPSON S.    Culture (A)  Final    STAPHYLOCOCCUS SPECIES (COAGULASE NEGATIVE) SUSCEPTIBILITIES PERFORMED ON PREVIOUS CULTURE WITHIN THE LAST 5 DAYS. Performed at [facility], [address], [place], [place] [address]    Report Status 01/01/2017 FINAL  Final         Radiology Studies: No results found.      Scheduled Meds: . acetaminophen  650 mg Oral BID  . ARIPiprazole  2 mg Oral QHS  . cholecalciferol  800 Units Oral Daily  . donepezil  5 mg Oral QHS  . heparin  5,000 Units Subcutaneous Q8H  . levothyroxine  25 mcg Oral QAC breakfast  . LORazepam  0.5 mg Oral BID  . memantine  10 mg Oral BID  . mirtazapine  7.5 mg Oral QHS  . multivitamin with minerals  1 tablet Oral Daily  . sertraline  75 mg Oral QHS  . simvastatin  20 mg Oral q1800   Continuous Infusions: . dextrose 5 % and 0.9% NaCl 50 mL/hr at 12/31/16 1116  . vancomycin Stopped (01/01/17 1111)     LOS: 0 days    Time spent: 30 minutes. Greater than 50% of this time was spent in direct contact with the patient coordinating care.     Chaya Jan, MD [facility] Pager [phone]  If 7PM-7AM, please contact night-coverage [email or web address] Password [email or web address] 01/01/2017, 4:26 PM

## 2017-01-01 NOTE — Clinical Social Work Note (Signed)
Clinical Social Work Assessment  Patient Details  Name: Kimberly [name] MRN: [id number] Date of Birth: 1950/12/19  Date of referral:  12/31/16               Reason for consult:  Discharge Planning                Permission sought to share information with:    Permission granted to share information::     Name::        Agency::  [name] at [facility]  Relationship::     Contact Information:     Housing/Transportation Living arrangements for the past 2 months:  [facility] Source of Information:  Facility Patient Interpreter Needed:  None Criminal Activity/Legal Involvement Pertinent to Current Situation/Hospitalization:  No - Comment as needed Significant Relationships:  Adult Children Lives with:  Adult Children Do you feel safe going back to the place where you live?  Yes Need for family participation in patient care:  Yes (Comment)  Care giving concerns: Facility resident. None identified at baseline.    Social Worker assessment / plan:  [facility], advised that patient has been at the facility for more than seven years. She stated that patient had a stroke a year ago and that was the first time she had seen patient's daughters.  Patient ambulates with a a walker with staff leading her. Patient receives assistance with ADLs from staff. Patient can return to the facility at discharge.  Patient has indwelling cath and for the past two months she has been pulling it out.  She advised that for the past week, patient had not pulled the cath out.  Message left for family.   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   Patient/Family's Understanding of and Emotional Response to Diagnosis, Current Treatment, and Prognosis:  Patient is  aware of her diagnosis. LCSW will notify family of diagnosis, treatment and prognosis once contact is returned.   Emotional Assessment Appearance:  Appears stated age Attitude/Demeanor/Rapport:  Unable to Assess Affect (typically observed):  Unable to Assess Orientation:  Oriented to Self, Oriented to Place, Oriented to Situation Alcohol / Substance use:  Not Applicable Psych involvement (Current and /or in the community):  No (Comment)  Discharge Needs  Concerns to be addressed:  No discharge needs identified Readmission within the last 30 days:  No Current discharge risk:  None Barriers to Discharge:  No Barriers Identified   Annice NeedySettle, Loise Esguerra D, LCSW 01/01/2017, 1:02 PM

## 2017-01-01 NOTE — Progress Notes (Signed)
Central Telemetry called patient Heart rate sustaining in 130 bpm, Patient is continually trying to get out of bed, patient agitated.

## 2017-01-01 NOTE — Care Management Obs Status (Signed)
MEDICARE OBSERVATION STATUS NOTIFICATION   Patient Details  Name: Kimberly [name] MRN: [id number] Date of Birth: 1951/03/29   Medicare Observation Status Notification Given:  Yes (given 12/30/2016, discussed with patient, left voicemail for family, no return call)    Lilyan Prete, Chrystine OilerSharley Diane, RN 01/01/2017, 8:26 AM

## 2017-01-02 ENCOUNTER — Inpatient Hospital Stay (HOSPITAL_COMMUNITY): Payer: Medicare Other

## 2017-01-02 ENCOUNTER — Encounter (HOSPITAL_COMMUNITY)
Admission: EM | Disposition: A | Discharge status: Skilled Nursing Facility | Payer: Self-pay | Source: Home / Self Care | Attending: Internal Medicine

## 2017-01-02 DIAGNOSIS — F329 Major depressive disorder, single episode, unspecified: Secondary | ICD-10-CM | POA: Diagnosis not present

## 2017-01-02 DIAGNOSIS — E785 Hyperlipidemia, unspecified: Secondary | ICD-10-CM | POA: Diagnosis not present

## 2017-01-02 DIAGNOSIS — Z833 Family history of diabetes mellitus: Secondary | ICD-10-CM | POA: Diagnosis not present

## 2017-01-02 DIAGNOSIS — R7881 Bacteremia: Secondary | ICD-10-CM

## 2017-01-02 DIAGNOSIS — R451 Restlessness and agitation: Secondary | ICD-10-CM | POA: Diagnosis not present

## 2017-01-02 DIAGNOSIS — J449 Chronic obstructive pulmonary disease, unspecified: Secondary | ICD-10-CM | POA: Diagnosis not present

## 2017-01-02 DIAGNOSIS — I5030 Unspecified diastolic (congestive) heart failure: Secondary | ICD-10-CM

## 2017-01-02 DIAGNOSIS — E039 Hypothyroidism, unspecified: Secondary | ICD-10-CM | POA: Diagnosis not present

## 2017-01-02 DIAGNOSIS — R509 Fever, unspecified: Secondary | ICD-10-CM | POA: Diagnosis present

## 2017-01-02 DIAGNOSIS — Z8673 Personal history of transient ischemic attack (TIA), and cerebral infarction without residual deficits: Secondary | ICD-10-CM | POA: Diagnosis not present

## 2017-01-02 DIAGNOSIS — B9562 Methicillin resistant Staphylococcus aureus infection as the cause of diseases classified elsewhere: Secondary | ICD-10-CM | POA: Diagnosis not present

## 2017-01-02 DIAGNOSIS — N390 Urinary tract infection, site not specified: Secondary | ICD-10-CM | POA: Diagnosis not present

## 2017-01-02 DIAGNOSIS — I5032 Chronic diastolic (congestive) heart failure: Secondary | ICD-10-CM | POA: Diagnosis not present

## 2017-01-02 DIAGNOSIS — Z8701 Personal history of pneumonia (recurrent): Secondary | ICD-10-CM | POA: Diagnosis not present

## 2017-01-02 DIAGNOSIS — Z79899 Other long term (current) drug therapy: Secondary | ICD-10-CM | POA: Diagnosis not present

## 2017-01-02 DIAGNOSIS — R55 Syncope and collapse: Secondary | ICD-10-CM | POA: Diagnosis not present

## 2017-01-02 DIAGNOSIS — Z1624 Resistance to multiple antibiotics: Secondary | ICD-10-CM | POA: Diagnosis not present

## 2017-01-02 DIAGNOSIS — A403 Sepsis due to Streptococcus pneumoniae: Secondary | ICD-10-CM | POA: Diagnosis not present

## 2017-01-02 DIAGNOSIS — H9190 Unspecified hearing loss, unspecified ear: Secondary | ICD-10-CM | POA: Diagnosis not present

## 2017-01-02 DIAGNOSIS — I959 Hypotension, unspecified: Secondary | ICD-10-CM | POA: Diagnosis not present

## 2017-01-02 DIAGNOSIS — F0390 Unspecified dementia without behavioral disturbance: Secondary | ICD-10-CM | POA: Diagnosis present

## 2017-01-02 DIAGNOSIS — H547 Unspecified visual loss: Secondary | ICD-10-CM | POA: Diagnosis not present

## 2017-01-02 DIAGNOSIS — M1990 Unspecified osteoarthritis, unspecified site: Secondary | ICD-10-CM | POA: Diagnosis not present

## 2017-01-02 DIAGNOSIS — Z87891 Personal history of nicotine dependence: Secondary | ICD-10-CM | POA: Diagnosis not present

## 2017-01-02 DIAGNOSIS — B952 Enterococcus as the cause of diseases classified elsewhere: Secondary | ICD-10-CM | POA: Diagnosis not present

## 2017-01-02 DIAGNOSIS — A419 Sepsis, unspecified organism: Secondary | ICD-10-CM | POA: Diagnosis not present

## 2017-01-02 HISTORY — PX: TEE WITHOUT CARDIOVERSION: SHX5443

## 2017-01-02 LAB — BASIC METABOLIC PANEL
ANION GAP: 6 (ref 5–15)
BUN: 5 mg/dL — ABNORMAL LOW (ref 6–20)
CALCIUM: 8.3 mg/dL — AB (ref 8.9–10.3)
CO2: 26 mmol/L (ref 22–32)
Chloride: 111 mmol/L (ref 101–111)
Creatinine, Ser: 0.6 mg/dL (ref 0.44–1.00)
Glucose, Bld: 86 mg/dL (ref 65–99)
Potassium: 3.6 mmol/L (ref 3.5–5.1)
SODIUM: 143 mmol/L (ref 135–145)

## 2017-01-02 LAB — ECHOCARDIOGRAM COMPLETE
Height: 66 in
Weight: 2240 oz

## 2017-01-02 LAB — CBC
HCT: 36 % (ref 36.0–46.0)
HEMOGLOBIN: 11.6 g/dL — AB (ref 12.0–15.0)
MCH: 28.7 pg (ref 26.0–34.0)
MCHC: 32.2 g/dL (ref 30.0–36.0)
MCV: 89.1 fL (ref 78.0–100.0)
Platelets: 322 10*3/uL (ref 150–400)
RBC: 4.04 MIL/uL (ref 3.87–5.11)
RDW: 13.6 % (ref 11.5–15.5)
WBC: 5.1 10*3/uL (ref 4.0–10.5)

## 2017-01-02 LAB — VANCOMYCIN, TROUGH: VANCOMYCIN TR: 12 ug/mL — AB (ref 15–20)

## 2017-01-02 SURGERY — ECHOCARDIOGRAM, TRANSESOPHAGEAL
Anesthesia: Moderate Sedation

## 2017-01-02 MED ORDER — VANCOMYCIN HCL IN DEXTROSE 1-5 GM/200ML-% IV SOLN
1000.0000 mg | Freq: Once | INTRAVENOUS | Status: AC
  Administered 2017-01-02: 1000 mg via INTRAVENOUS
  Filled 2017-01-02: qty 200

## 2017-01-02 MED ORDER — VANCOMYCIN HCL IN DEXTROSE 750-5 MG/150ML-% IV SOLN
750.0000 mg | Freq: Two times a day (BID) | INTRAVENOUS | Status: DC
  Administered 2017-01-03 – 2017-01-05 (×4): 750 mg via INTRAVENOUS
  Filled 2017-01-02 (×8): qty 150

## 2017-01-02 MED ORDER — SODIUM CHLORIDE 0.9 % IV SOLN: INTRAVENOUS | Status: DC

## 2017-01-02 NOTE — Progress Notes (Signed)
PHARMACY - PHYSICIAN COMMUNICATION CRITICAL VALUE ALERT - BLOOD CULTURE IDENTIFICATION (BCID)  Results for orders placed or performed during the hospital encounter of 12/29/16  Blood Culture ID Panel (Reflexed) (Collected: 12/29/2016  3:08 PM)  Result Value Ref Range   Enterococcus species NOT DETECTED NOT DETECTED   Listeria monocytogenes NOT DETECTED NOT DETECTED   Staphylococcus species DETECTED (A) NOT DETECTED   Staphylococcus aureus NOT DETECTED NOT DETECTED   Methicillin resistance DETECTED (A) NOT DETECTED   Streptococcus species NOT DETECTED NOT DETECTED   Streptococcus agalactiae NOT DETECTED NOT DETECTED   Streptococcus pneumoniae NOT DETECTED NOT DETECTED   Streptococcus pyogenes NOT DETECTED NOT DETECTED   Acinetobacter baumannii NOT DETECTED NOT DETECTED   Enterobacteriaceae species NOT DETECTED NOT DETECTED   Enterobacter cloacae complex NOT DETECTED NOT DETECTED   Escherichia coli NOT DETECTED NOT DETECTED   Klebsiella oxytoca NOT DETECTED NOT DETECTED   Klebsiella pneumoniae NOT DETECTED NOT DETECTED   Proteus species NOT DETECTED NOT DETECTED   Serratia marcescens NOT DETECTED NOT DETECTED   Haemophilus influenzae NOT DETECTED NOT DETECTED   Neisseria meningitidis NOT DETECTED NOT DETECTED   Pseudomonas aeruginosa NOT DETECTED NOT DETECTED   Candida albicans NOT DETECTED NOT DETECTED   Candida glabrata NOT DETECTED NOT DETECTED   Candida krusei NOT DETECTED NOT DETECTED   Candida parapsilosis NOT DETECTED NOT DETECTED   Candida tropicalis NOT DETECTED NOT DETECTED   A/P: Vancomycin trough level is below target.   BCID reported + GPC, mec + in 2 of 2 BCX aerobic bottles. Discussed with MD and will continue Vancomycin. Follow up ID and sensitivities.   Vancomycin 1000mg  x 1 now then 750mg  IV q12hrs (increased from 500mg ) F/U cxs and clinical progress, ID input Monitor V/S, labs, and levels as indicated  Kimberly [name], PharmD Clinical Pharmacist Pager:   [phone] 01/02/2017  01/02/2017  11:37 AM

## 2017-01-02 NOTE — Progress Notes (Signed)
PROGRESS NOTE    Kimberly [name]  ZOX:[id number] DOB: [birth year] DOA: 12/30/2016 PCP: Pearson Grippe, MD     Brief Narrative:  66 year old woman admitted to the hospital on 8/7 due to hypotension. She was seen in the ED the day prior for a UTI and given Macrobid and however returned the next day due to hypotension and fever. She was found to have a mild leukocytosis with a WBC count of 13.3. Started on Rocephin and admission was requested for early sepsis due to UTI. Urine culture is positive for enterococcus and 2 out of 2 blood cultures are growing coag-negative staph. Antibiotics have been transitioned over to vancomycin.   Assessment & Plan:   Active Problems:   Chronic diastolic heart failure (HCC)   Memory disorder   Syncope and collapse   Blind   Hypothyroidism   History of CVA (cerebrovascular accident)   Dementia without behavioral disturbance   Urinary retention   Hyperlipidemia   Sepsis (HCC)   MRSA bacteremia   Sepsis -Has culture positive UTI (enterococcus), however is also growing what appears to be CNS in the blood. -Discussed case via phone with infectious diseases. They have recommended TEE to rule out endocarditis, also repeat blood cultures with placement of PICC and 2 versus 4 weeks of antibiotics pending results of TEE. -Discussed with cardiology. Unable to perform TEE given she cannot consent to procedure given her dementia and multiple attempts to reach family members through all 3 numbers that we have have been unsuccessful. Procedure has been canceled. -Sepsis parameters have resolved. -Once bacteremia has cleared, will place PICC line and discharge with 4 weeks of IV antibiotics.  Dementia -At baseline, without behavioral disturbances.  Hypothyroidism -Continue Synthroid  Hyperlipidemia -Continue statin   DVT prophylaxis: Subcutaneous heparin  Code Status: Full code  Family Communication: Patient only  Disposition Plan: To be determined    Consultants:  None  Procedures:  None  Antimicrobials:  Anti-infectives    Start     Dose/Rate Route Frequency Ordered Stop   01/02/17 2300  vancomycin (VANCOCIN) IVPB 750 mg/150 ml premix     750 mg 150 mL/hr over 60 Minutes Intravenous Every 12 hours 01/02/17 1134     01/02/17 1200  vancomycin (VANCOCIN) IVPB 1000 mg/200 mL premix     1,000 mg 200 mL/hr over 60 Minutes Intravenous  Once 01/02/17 1136 01/02/17 1317   12/30/16 2300  vancomycin (VANCOCIN) 500 mg in sodium chloride 0.9 % 100 mL IVPB  Status:  Discontinued     500 mg 100 mL/hr over 60 Minutes Intravenous Every 12 hours 12/30/16 1513 01/02/17 1134   12/30/16 1800  cefTRIAXone (ROCEPHIN) 1 g in dextrose 5 % 50 mL IVPB  Status:  Discontinued     1 g 100 mL/hr over 30 Minutes Intravenous Every 24 hours 12/30/16 1321 12/31/16 1808   12/30/16 1015  vancomycin (VANCOCIN) IVPB 1000 mg/200 mL premix     1,000 mg 200 mL/hr over 60 Minutes Intravenous  Once 12/30/16 1000 12/30/16 1201   12/30/16 1015  piperacillin-tazobactam (ZOSYN) IVPB 3.375 g     3.375 g 100 mL/hr over 30 Minutes Intravenous  Once 12/30/16 1000 12/30/16 1059       Subjective: Was agitated overnight, is somewhat drowsy this morning.  Objective: Vitals:   01/01/17 2054 01/01/17 2148 01/02/17 0542 01/02/17 1423  BP:  118/81 129/77 124/74  Pulse:  81 79 76  Resp:  18 18 20   Temp:  98.7 F (37.1 C) 98.5 F (  36.9 C) 98.4 F (36.9 C)  TempSrc:  Oral Oral Oral  SpO2: 92% 93% 93% 95%  Weight:      Height:        Intake/Output Summary (Last 24 hours) at 01/02/17 1510 Last data filed at 01/02/17 1300  Gross per 24 hour  Intake              120 ml  Output             2050 ml  Net            -1930 ml   Filed Weights   12/30/16 0937  Weight: 63.5 kg (140 lb)    Examination:  General exam: Somnolent, however arouses and can answer questions appropriately,Although at times appears confused Respiratory system: Clear to auscultation. Respiratory  effort normal. Cardiovascular system:RRR. No murmurs, rubs, gallops. Gastrointestinal system: Abdomen is nondistended, soft and nontender. No organomegaly or masses felt. Normal bowel sounds heard. Central nervous system: Drowsy but oriented when awakened. No focal neurological deficits. Extremities: 1+ edema bilaterally Skin: No rashes, lesions or ulcers Psychiatry: Unable to fully assess given current mental state    Data Reviewed: I have personally reviewed following labs and imaging studies  CBC:  Recent Labs Lab 12/29/16 1132 12/30/16 0951 01/01/17 0554 01/02/17 0642  WBC 15.2* 11.6* 6.3 5.1  NEUTROABS 13.3* 9.2*  --   --   HGB 13.1 11.4* 10.8* 11.6*  HCT 41.6 36.6 35.2* 36.0  MCV 89.7 90.8 89.8 89.1  PLT 353 317 299 322   Basic Metabolic Panel:  Recent Labs Lab 12/29/16 1132 12/30/16 0951 01/01/17 0554 01/02/17 0642  NA 141 142 144 143  K 4.2 3.3* 3.2* 3.6  CL 105 111 114* 111  CO2 27 23 26 26   GLUCOSE 123* 132* 95 86  BUN 24* 14 9 5*  CREATININE 1.18* 0.84 0.65 0.60  CALCIUM 9.5 8.8* 8.1* 8.3*   GFR: Estimated Creatinine Clearance: 63.9 mL/min (by C-G formula based on SCr of 0.6 mg/dL). Liver Function Tests:  Recent Labs Lab 12/29/16 1132 12/30/16 0951  AST 23 19  ALT 19 16  ALKPHOS 58 49  BILITOT 0.4 0.5  PROT 7.0 6.2*  ALBUMIN 3.6 3.1*    Recent Labs Lab 12/29/16 1132  LIPASE 39   No results for input(s): AMMONIA in the last 168 hours. Coagulation Profile:  Recent Labs Lab 12/30/16 0951  INR 1.04   Cardiac Enzymes:  Recent Labs Lab 12/29/16 1132  TROPONINI 0.05*   BNP (last 3 results) No results for input(s): PROBNP in the last 8760 hours. HbA1C: No results for input(s): HGBA1C in the last 72 hours. CBG: No results for input(s): GLUCAP in the last 168 hours. Lipid Profile: No results for input(s): CHOL, HDL, LDLCALC, TRIG, CHOLHDL, LDLDIRECT in the last 72 hours. Thyroid Function Tests: No results for input(s): TSH,  T4TOTAL, FREET4, T3FREE, THYROIDAB in the last 72 hours. Anemia Panel: No results for input(s): VITAMINB12, FOLATE, FERRITIN, TIBC, IRON, RETICCTPCT in the last 72 hours. Urine analysis:    Component Value Date/Time   COLORURINE RED (A) 12/30/2016 0951   APPEARANCEUR CLOUDY (A) 12/30/2016 0951   LABSPEC <1.005 (L) 12/30/2016 0951   PHURINE >9.0 (H) 12/30/2016 0951   GLUCOSEU NEGATIVE 12/30/2016 0951   HGBUR LARGE (A) 12/30/2016 0951   BILIRUBINUR NEGATIVE 12/30/2016 0951   KETONESUR NEGATIVE 12/30/2016 0951   PROTEINUR >300 (A) 12/30/2016 0951   UROBILINOGEN 0.2 11/02/2013 1320   NITRITE NEGATIVE 12/30/2016 0951  LEUKOCYTESUR LARGE (A) 12/30/2016 0951   Sepsis Labs: @LABRCNTIP (procalcitonin:4,lacticidven:4)  ) Recent Results (from the past 240 hour(s))  Urine culture     Status: Abnormal   Collection Time: 12/29/16  2:23 PM  Result Value Ref Range Status   Specimen Description URINE, CLEAN CATCH  Final   Special Requests NONE  Final   Culture >=100,000 COLONIES/mL ENTEROCOCCUS FAECALIS (A)  Final   Report Status 01/01/2017 FINAL  Final   Organism ID, Bacteria ENTEROCOCCUS FAECALIS (A)  Final      Susceptibility   Enterococcus faecalis - MIC*    AMPICILLIN <=2 SENSITIVE Sensitive     LEVOFLOXACIN >=8 RESISTANT Resistant     NITROFURANTOIN <=16 SENSITIVE Sensitive     VANCOMYCIN 1 SENSITIVE Sensitive     * >=100,000 COLONIES/mL ENTEROCOCCUS FAECALIS  Blood culture (routine x 2)     Status: Abnormal   Collection Time: 12/29/16  3:08 PM  Result Value Ref Range Status   Specimen Description BLOOD RIGHT ARM  Final   Special Requests   Final    BOTTLES DRAWN AEROBIC ONLY Blood Culture results may not be optimal due to an inadequate volume of blood received in culture bottles   Culture  Setup Time   Final    GRAM POSITIVE COCCI Gram Stain Report Called to,Read Back By and Verified With: CARDWELL L. AT 1023A ON [id number] BY THOMPSON S.    Culture STAPHYLOCOCCUS SPECIES  (COAGULASE NEGATIVE) (A)  Final   Report Status 01/01/2017 FINAL  Final   Organism ID, Bacteria STAPHYLOCOCCUS SPECIES (COAGULASE NEGATIVE)  Final      Susceptibility   Staphylococcus species (coagulase negative) - MIC*    CIPROFLOXACIN 4 RESISTANT Resistant     ERYTHROMYCIN >=8 RESISTANT Resistant     GENTAMICIN <=0.5 SENSITIVE Sensitive     OXACILLIN >=4 RESISTANT Resistant     TETRACYCLINE 4 SENSITIVE Sensitive     VANCOMYCIN 1 SENSITIVE Sensitive     TRIMETH/SULFA 80 RESISTANT Resistant     CLINDAMYCIN RESISTANT Resistant     RIFAMPIN <=0.5 SENSITIVE Sensitive     Inducible Clindamycin POSITIVE Resistant     * STAPHYLOCOCCUS SPECIES (COAGULASE NEGATIVE)  Blood Culture ID Panel (Reflexed)     Status: Abnormal   Collection Time: 12/29/16  3:08 PM  Result Value Ref Range Status   Enterococcus species NOT DETECTED NOT DETECTED Final   Listeria monocytogenes NOT DETECTED NOT DETECTED Final   Staphylococcus species DETECTED (A) NOT DETECTED Final    Comment: Methicillin (oxacillin) resistant coagulase negative staphylococcus. Possible blood culture contaminant (unless isolated from more than one blood culture draw or clinical case suggests pathogenicity). No antibiotic treatment is indicated for blood  culture contaminants. CRITICAL RESULT CALLED TO, READ BACK BY AND VERIFIED WITH: LBoykin Reaper.D. 14:45 12/30/16 (wilsonm)    Staphylococcus aureus NOT DETECTED NOT DETECTED Final   Methicillin resistance DETECTED (A) NOT DETECTED Final    Comment: CRITICAL RESULT CALLED TO, READ BACK BY AND VERIFIED WITH: LBoykin Reaper.D. 14:45 12/30/16 (wilsonm)    Streptococcus species NOT DETECTED NOT DETECTED Final   Streptococcus agalactiae NOT DETECTED NOT DETECTED Final   Streptococcus pneumoniae NOT DETECTED NOT DETECTED Final   Streptococcus pyogenes NOT DETECTED NOT DETECTED Final   Acinetobacter baumannii NOT DETECTED NOT DETECTED Final   Enterobacteriaceae species NOT DETECTED NOT DETECTED  Final   Enterobacter cloacae complex NOT DETECTED NOT DETECTED Final   Escherichia coli NOT DETECTED NOT DETECTED Final   Klebsiella oxytoca NOT DETECTED NOT DETECTED  Final   Klebsiella pneumoniae NOT DETECTED NOT DETECTED Final   Proteus species NOT DETECTED NOT DETECTED Final   Serratia marcescens NOT DETECTED NOT DETECTED Final   Haemophilus influenzae NOT DETECTED NOT DETECTED Final   Neisseria meningitidis NOT DETECTED NOT DETECTED Final   Pseudomonas aeruginosa NOT DETECTED NOT DETECTED Final   Candida albicans NOT DETECTED NOT DETECTED Final   Candida glabrata NOT DETECTED NOT DETECTED Final   Candida krusei NOT DETECTED NOT DETECTED Final   Candida parapsilosis NOT DETECTED NOT DETECTED Final   Candida tropicalis NOT DETECTED NOT DETECTED Final    Comment: Performed at [facility], [address] [place], [place] [address]  Blood culture (routine x 2)     Status: Abnormal   Collection Time: 12/29/16  3:10 PM  Result Value Ref Range Status   Specimen Description BLOOD LEFT HAND  Final   Special Requests   Final    BOTTLES DRAWN AEROBIC ONLY Blood Culture results may not be optimal due to an inadequate volume of blood received in culture bottles   Culture  Setup Time   Final    GRAM POSITIVE COCCI Gram Stain Report Called to,Read Back By and Verified With: CARDWELL L. AT 1023A ON [id number] BY THOMPSON S.    Culture (A)  Final    STAPHYLOCOCCUS SPECIES (COAGULASE NEGATIVE) SUSCEPTIBILITIES PERFORMED ON PREVIOUS CULTURE WITHIN THE LAST 5 DAYS. Performed at [facility], [address] [place], [place] [address]    Report Status 01/01/2017 FINAL  Final  Culture, blood (Routine X 2) w Reflex to ID Panel     Status: None (Preliminary result)   Collection Time: 01/01/17  9:15 PM  Result Value Ref Range Status   Specimen Description BLOOD LEFT WRIST  Final   Special Requests   Final    BOTTLES DRAWN AEROBIC ONLY Blood Culture results may not be optimal due to an  inadequate volume of blood received in culture bottles   Culture NO GROWTH < 12 HOURS  Final   Report Status PENDING  Incomplete  Culture, blood (Routine X 2) w Reflex to ID Panel     Status: None (Preliminary result)   Collection Time: 01/01/17  9:15 PM  Result Value Ref Range Status   Specimen Description LEFT ANTECUBITAL  Final   Special Requests   Final    BOTTLES DRAWN AEROBIC AND ANAEROBIC Blood Culture results may not be optimal due to an inadequate volume of blood received in culture bottles   Culture NO GROWTH < 12 HOURS  Final   Report Status PENDING  Incomplete         Radiology Studies: No results found.      Scheduled Meds: . acetaminophen  650 mg Oral BID  . ARIPiprazole  2 mg Oral QHS  . cholecalciferol  800 Units Oral Daily  . donepezil  5 mg Oral QHS  . heparin  5,000 Units Subcutaneous Q8H  . levothyroxine  25 mcg Oral QAC breakfast  . LORazepam  0.5 mg Oral BID  . memantine  10 mg Oral BID  . mirtazapine  7.5 mg Oral QHS  . multivitamin with minerals  1 tablet Oral Daily  . sertraline  75 mg Oral QHS  . simvastatin  20 mg Oral q1800   Continuous Infusions: . sodium chloride Stopped (01/02/17 1023)  . dextrose 5 % and 0.9% NaCl 50 mL/hr at 01/01/17 2248  . vancomycin       LOS: 0 days  Time spent: 25 minutes. Greater than 50% of this time was spent in direct contact with the patient coordinating care.     Chaya Jan, MD [facility] Pager [phone]  If 7PM-7AM, please contact night-coverage [email or web address] Password [email or web address] 01/02/2017, 3:10 PM

## 2017-01-02 NOTE — Progress Notes (Signed)
Patient is a hard IV stick patient, Patient continually pulls out her IV especially  if in the hands. Dr. Ardyth HarpsHernandez made aware, does not want PICC placed at this time due to sepsis, Vascular called for peripheral IV placement.

## 2017-01-02 NOTE — Progress Notes (Signed)
Contacted about a TEE for patient. Patient has dementia, she is A&Ox1. She is not consentable. Nursing staff has not been able to get in touch with family for consent. Her TEE is not an urgent or emergent procedure for her, and does not immediately affect her treatment course since results will only determine whether 2 or 4 weeks of abx is needed. We will cancel TEE today, please contact us once family has been able to be contacted for consent, we are happy to arrange this as an outpatient as well at any point.   Dina RichJonathan Harjit Leider.

## 2017-01-02 NOTE — Progress Notes (Signed)
*  PRELIMINARY RESULTS* Echocardiogram 2D Echocardiogram has been performed.  Jeryl Columbialliott, Carlester Kasparek 01/02/2017, 4:49 PM

## 2017-01-02 NOTE — Care Management Note (Signed)
Case Management Note  Patient Details  Name: Kimberly [name] MRN: [id number] Date of Birth: Dec 01, 1950  Subjective/Objective:                  Admitted with sepsis. [facility] Pt will need 2-4 weeks IV abx and will needs SNF to receive ABX. CSW aware. PF had [facility].   Action/Plan: [facility] rep aware of DC plan. CSW will make placement arrangements. SM will follow to DC.   Expected Discharge Date:     01/05/2017             Expected Discharge Plan:  Skilled Nursing Facility  In-House Referral:  Clinical Social Work  Discharge planning Services  CM Consult  Post Acute Care Choice:  NA Choice offered to:  NA  Status of Service:  In process, will continue to follow  Malcolm MetroChildress, Tyreak Reagle Demske, RN 01/02/2017, 10:56 AM

## 2017-01-02 NOTE — Care Management Important Message (Signed)
Important Message  Patient Details  Name: Kimberly [name] MRN: [id number] Date of Birth: 08-24-1950   Medicare Important Message Given:  Yes    Malcolm MetroChildress, Arther Heisler Demske, RN 01/02/2017, 11:00 AM

## 2017-01-03 DIAGNOSIS — I5032 Chronic diastolic (congestive) heart failure: Secondary | ICD-10-CM | POA: Diagnosis not present

## 2017-01-03 DIAGNOSIS — F0390 Unspecified dementia without behavioral disturbance: Secondary | ICD-10-CM | POA: Diagnosis not present

## 2017-01-03 DIAGNOSIS — A419 Sepsis, unspecified organism: Secondary | ICD-10-CM | POA: Diagnosis not present

## 2017-01-03 NOTE — Progress Notes (Signed)
PROGRESS NOTE    Kimberly [name]  WUJ:[id number] DOB: [birth year] DOA: 12/30/2016 PCP: Pearson Grippe, MD     Brief Narrative:  66 year old woman admitted to the hospital on 8/7 due to hypotension. She was seen in the ED the day prior for a UTI and given Macrobid and however returned the next day due to hypotension and fever. She was found to have a mild leukocytosis with a WBC count of 13.3. Started on Rocephin and admission was requested for early sepsis due to UTI. Urine culture is positive for enterococcus and 2 out of 2 blood cultures are growing coag-negative staph. Antibiotics have been transitioned over to vancomycin.   Assessment & Plan:   Active Problems:   Chronic diastolic heart failure (HCC)   Memory disorder   Syncope and collapse   Blind   Hypothyroidism   History of CVA (cerebrovascular accident)   Dementia without behavioral disturbance   Urinary retention   Hyperlipidemia   Sepsis (HCC)   MRSA bacteremia   Sepsis -Has culture positive UTI (enterococcus), however is also growing CNS in the blood. -Discussed case via phone with infectious diseases. They have recommended TEE to rule out endocarditis, also repeat blood cultures with placement of PICC and 2 versus 4 weeks of antibiotics pending results of TEE. -Discussed with cardiology. Unable to perform TEE given she cannot consent to procedure given her dementia and multiple attempts to reach family members through all 3 numbers that we have have been unsuccessful. Procedure has been canceled. -Sepsis parameters have resolved. -Once bacteremia has cleared, will place PICC line and discharge with 4 weeks of IV vancomycin. -Will wait till cultures from 8/9 have been negative for 3 days before placing PICC line. -2-D echo done without evidence of endocarditis.  Dementia -At baseline, has had some restlessness.  Hypothyroidism -Continue Synthroid  Hyperlipidemia -Continue statin   DVT prophylaxis: Subcutaneous  heparin  Code Status: Full code  Family Communication: Patient only  Disposition Plan: SNF once medically ready, anticipate 48 hours.  Consultants:  None   Procedures:  None   Antimicrobials:  Anti-infectives    Start     Dose/Rate Route Frequency Ordered Stop   01/02/17 2300  vancomycin (VANCOCIN) IVPB 750 mg/150 ml premix     750 mg 150 mL/hr over 60 Minutes Intravenous Every 12 hours 01/02/17 1134     01/02/17 1200  vancomycin (VANCOCIN) IVPB 1000 mg/200 mL premix     1,000 mg 200 mL/hr over 60 Minutes Intravenous  Once 01/02/17 1136 01/02/17 1317   12/30/16 2300  vancomycin (VANCOCIN) 500 mg in sodium chloride 0.9 % 100 mL IVPB  Status:  Discontinued     500 mg 100 mL/hr over 60 Minutes Intravenous Every 12 hours 12/30/16 1513 01/02/17 1134   12/30/16 1800  cefTRIAXone (ROCEPHIN) 1 g in dextrose 5 % 50 mL IVPB  Status:  Discontinued     1 g 100 mL/hr over 30 Minutes Intravenous Every 24 hours 12/30/16 1321 12/31/16 1808   12/30/16 1015  vancomycin (VANCOCIN) IVPB 1000 mg/200 mL premix     1,000 mg 200 mL/hr over 60 Minutes Intravenous  Once 12/30/16 1000 12/30/16 1201   12/30/16 1015  piperacillin-tazobactam (ZOSYN) IVPB 3.375 g     3.375 g 100 mL/hr over 30 Minutes Intravenous  Once 12/30/16 1000 12/30/16 1059       Subjective: Seems somnolent this a.m., per RN has had periods of restlessness and has pulled out several IV lines.  Objective: Vitals:  01/02/17 0542 01/02/17 1423 01/02/17 2118 01/03/17 0554  BP: 129/77 124/74 124/74 140/71  Pulse: 79 76 91 85  Resp: 18 20 18 18   Temp: 98.5 F (36.9 C) 98.4 F (36.9 C) 98.9 F (37.2 C) 98.2 F (36.8 C)  TempSrc: Oral Oral Oral Oral  SpO2: 93% 95% 98% 92%  Weight:      Height:        Intake/Output Summary (Last 24 hours) at 01/03/17 1520 Last data filed at 01/03/17 0900  Gross per 24 hour  Intake              480 ml  Output              350 ml  Net              130 ml   Filed Weights   12/30/16 0937    Weight: 63.5 kg (140 lb)    Examination:  General exam: Quite somnolent today. Respiratory system: Clear to auscultation. Respiratory effort normal. Cardiovascular system:RRR. No murmurs, rubs, gallops. Gastrointestinal system: Abdomen is nondistended, soft and nontender. No organomegaly or masses felt. Normal bowel sounds heard. Central nervous system: Drowsy, unable to fully assess given current mental status Extremities: Arms, especially left hare markedly edematous. Skin: No rashes, lesions or ulcers Psychiatry: Unable to fully assess given current mental status     Data Reviewed: I have personally reviewed following labs and imaging studies  CBC:  Recent Labs Lab 12/29/16 1132 12/30/16 0951 01/01/17 0554 01/02/17 0642  WBC 15.2* 11.6* 6.3 5.1  NEUTROABS 13.3* 9.2*  --   --   HGB 13.1 11.4* 10.8* 11.6*  HCT 41.6 36.6 35.2* 36.0  MCV 89.7 90.8 89.8 89.1  PLT 353 317 299 322   Basic Metabolic Panel:  Recent Labs Lab 12/29/16 1132 12/30/16 0951 01/01/17 0554 01/02/17 0642  NA 141 142 144 143  K 4.2 3.3* 3.2* 3.6  CL 105 111 114* 111  CO2 27 23 26 26   GLUCOSE 123* 132* 95 86  BUN 24* 14 9 5*  CREATININE 1.18* 0.84 0.65 0.60  CALCIUM 9.5 8.8* 8.1* 8.3*   GFR: Estimated Creatinine Clearance: 63.9 mL/min (by C-G formula based on SCr of 0.6 mg/dL). Liver Function Tests:  Recent Labs Lab 12/29/16 1132 12/30/16 0951  AST 23 19  ALT 19 16  ALKPHOS 58 49  BILITOT 0.4 0.5  PROT 7.0 6.2*  ALBUMIN 3.6 3.1*    Recent Labs Lab 12/29/16 1132  LIPASE 39   No results for input(s): AMMONIA in the last 168 hours. Coagulation Profile:  Recent Labs Lab 12/30/16 0951  INR 1.04   Cardiac Enzymes:  Recent Labs Lab 12/29/16 1132  TROPONINI 0.05*   BNP (last 3 results) No results for input(s): PROBNP in the last 8760 hours. HbA1C: No results for input(s): HGBA1C in the last 72 hours. CBG: No results for input(s): GLUCAP in the last 168  hours. Lipid Profile: No results for input(s): CHOL, HDL, LDLCALC, TRIG, CHOLHDL, LDLDIRECT in the last 72 hours. Thyroid Function Tests: No results for input(s): TSH, T4TOTAL, FREET4, T3FREE, THYROIDAB in the last 72 hours. Anemia Panel: No results for input(s): VITAMINB12, FOLATE, FERRITIN, TIBC, IRON, RETICCTPCT in the last 72 hours. Urine analysis:    Component Value Date/Time   COLORURINE RED (A) 12/30/2016 0951   APPEARANCEUR CLOUDY (A) 12/30/2016 0951   LABSPEC <1.005 (L) 12/30/2016 0951   PHURINE >9.0 (H) 12/30/2016 0951   GLUCOSEU NEGATIVE 12/30/2016 0951   HGBUR  LARGE (A) 12/30/2016 0951   BILIRUBINUR NEGATIVE 12/30/2016 0951   KETONESUR NEGATIVE 12/30/2016 0951   PROTEINUR >300 (A) 12/30/2016 0951   UROBILINOGEN 0.2 11/02/2013 1320   NITRITE NEGATIVE 12/30/2016 0951   LEUKOCYTESUR LARGE (A) 12/30/2016 0951   Sepsis Labs: @LABRCNTIP (procalcitonin:4,lacticidven:4)  ) Recent Results (from the past 240 hour(s))  Urine culture     Status: Abnormal   Collection Time: 12/29/16  2:23 PM  Result Value Ref Range Status   Specimen Description URINE, CLEAN CATCH  Final   Special Requests NONE  Final   Culture >=100,000 COLONIES/mL ENTEROCOCCUS FAECALIS (A)  Final   Report Status 01/01/2017 FINAL  Final   Organism ID, Bacteria ENTEROCOCCUS FAECALIS (A)  Final      Susceptibility   Enterococcus faecalis - MIC*    AMPICILLIN <=2 SENSITIVE Sensitive     LEVOFLOXACIN >=8 RESISTANT Resistant     NITROFURANTOIN <=16 SENSITIVE Sensitive     VANCOMYCIN 1 SENSITIVE Sensitive     * >=100,000 COLONIES/mL ENTEROCOCCUS FAECALIS  Blood culture (routine x 2)     Status: Abnormal   Collection Time: 12/29/16  3:08 PM  Result Value Ref Range Status   Specimen Description BLOOD RIGHT ARM  Final   Special Requests   Final    BOTTLES DRAWN AEROBIC ONLY Blood Culture results may not be optimal due to an inadequate volume of blood received in culture bottles   Culture  Setup Time   Final     GRAM POSITIVE COCCI Gram Stain Report Called to,Read Back By and Verified With: CARDWELL L. AT 1023A ON [id number] BY THOMPSON S.    Culture STAPHYLOCOCCUS SPECIES (COAGULASE NEGATIVE) (A)  Final   Report Status 01/01/2017 FINAL  Final   Organism ID, Bacteria STAPHYLOCOCCUS SPECIES (COAGULASE NEGATIVE)  Final      Susceptibility   Staphylococcus species (coagulase negative) - MIC*    CIPROFLOXACIN 4 RESISTANT Resistant     ERYTHROMYCIN >=8 RESISTANT Resistant     GENTAMICIN <=0.5 SENSITIVE Sensitive     OXACILLIN >=4 RESISTANT Resistant     TETRACYCLINE 4 SENSITIVE Sensitive     VANCOMYCIN 1 SENSITIVE Sensitive     TRIMETH/SULFA 80 RESISTANT Resistant     CLINDAMYCIN RESISTANT Resistant     RIFAMPIN <=0.5 SENSITIVE Sensitive     Inducible Clindamycin POSITIVE Resistant     * STAPHYLOCOCCUS SPECIES (COAGULASE NEGATIVE)  Blood Culture ID Panel (Reflexed)     Status: Abnormal   Collection Time: 12/29/16  3:08 PM  Result Value Ref Range Status   Enterococcus species NOT DETECTED NOT DETECTED Final   Listeria monocytogenes NOT DETECTED NOT DETECTED Final   Staphylococcus species DETECTED (A) NOT DETECTED Final    Comment: Methicillin (oxacillin) resistant coagulase negative staphylococcus. Possible blood culture contaminant (unless isolated from more than one blood culture draw or clinical case suggests pathogenicity). No antibiotic treatment is indicated for blood  culture contaminants. CRITICAL RESULT CALLED TO, READ BACK BY AND VERIFIED WITH: LBoykin Reaper.D. 14:45 12/30/16 ([email or web address])    Staphylococcus aureus NOT DETECTED NOT DETECTED Final   Methicillin resistance DETECTED (A) NOT DETECTED Final    Comment: CRITICAL RESULT CALLED TO, READ BACK BY AND VERIFIED WITH: LBoykin Reaper.D. 14:45 12/30/16 ([email or web address])    Streptococcus species NOT DETECTED NOT DETECTED Final   Streptococcus agalactiae NOT DETECTED NOT DETECTED Final   Streptococcus pneumoniae NOT DETECTED NOT DETECTED Final    Streptococcus pyogenes NOT DETECTED NOT DETECTED Final   Acinetobacter baumannii NOT DETECTED NOT DETECTED  Final   Enterobacteriaceae species NOT DETECTED NOT DETECTED Final   Enterobacter cloacae complex NOT DETECTED NOT DETECTED Final   Escherichia coli NOT DETECTED NOT DETECTED Final   Klebsiella oxytoca NOT DETECTED NOT DETECTED Final   Klebsiella pneumoniae NOT DETECTED NOT DETECTED Final   Proteus species NOT DETECTED NOT DETECTED Final   Serratia marcescens NOT DETECTED NOT DETECTED Final   Haemophilus influenzae NOT DETECTED NOT DETECTED Final   Neisseria meningitidis NOT DETECTED NOT DETECTED Final   Pseudomonas aeruginosa NOT DETECTED NOT DETECTED Final   Candida albicans NOT DETECTED NOT DETECTED Final   Candida glabrata NOT DETECTED NOT DETECTED Final   Candida krusei NOT DETECTED NOT DETECTED Final   Candida parapsilosis NOT DETECTED NOT DETECTED Final   Candida tropicalis NOT DETECTED NOT DETECTED Final    Comment: Performed at [facility], [address] [place], [place] [address]  Blood culture (routine x 2)     Status: Abnormal   Collection Time: 12/29/16  3:10 PM  Result Value Ref Range Status   Specimen Description BLOOD LEFT HAND  Final   Special Requests   Final    BOTTLES DRAWN AEROBIC ONLY Blood Culture results may not be optimal due to an inadequate volume of blood received in culture bottles   Culture  Setup Time   Final    GRAM POSITIVE COCCI Gram Stain Report Called to,Read Back By and Verified With: CARDWELL L. AT 1023A ON [id number] BY THOMPSON S.    Culture (A)  Final    STAPHYLOCOCCUS SPECIES (COAGULASE NEGATIVE) SUSCEPTIBILITIES PERFORMED ON PREVIOUS CULTURE WITHIN THE LAST 5 DAYS. Performed at [facility], [address], [place], [place] [address]    Report Status 01/01/2017 FINAL  Final  Culture, blood (Routine X 2) w Reflex to ID Panel     Status: None (Preliminary result)   Collection Time: 01/01/17  9:15 PM  Result Value Ref Range  Status   Specimen Description BLOOD LEFT WRIST  Final   Special Requests   Final    BOTTLES DRAWN AEROBIC ONLY Blood Culture results may not be optimal due to an inadequate volume of blood received in culture bottles   Culture NO GROWTH 2 DAYS  Final   Report Status PENDING  Incomplete  Culture, blood (Routine X 2) w Reflex to ID Panel     Status: None (Preliminary result)   Collection Time: 01/01/17  9:15 PM  Result Value Ref Range Status   Specimen Description LEFT ANTECUBITAL  Final   Special Requests   Final    BOTTLES DRAWN AEROBIC AND ANAEROBIC Blood Culture results may not be optimal due to an inadequate volume of blood received in culture bottles   Culture NO GROWTH 2 DAYS  Final   Report Status PENDING  Incomplete  Culture, blood (Routine X 2) w Reflex to ID Panel     Status: None (Preliminary result)   Collection Time: 01/02/17  3:30 PM  Result Value Ref Range Status   Specimen Description BLOOD LEFT HAND  Final   Special Requests   Final    BOTTLES DRAWN AEROBIC AND ANAEROBIC Blood Culture results may not be optimal due to an inadequate volume of blood received in culture bottles   Culture NO GROWTH < 24 HOURS  Final   Report Status PENDING  Incomplete         Radiology Studies: No results found.      Scheduled Meds: . acetaminophen  650 mg Oral  BID  . ARIPiprazole  2 mg Oral QHS  . cholecalciferol  800 Units Oral Daily  . donepezil  5 mg Oral QHS  . heparin  5,000 Units Subcutaneous Q8H  . levothyroxine  25 mcg Oral QAC breakfast  . LORazepam  0.5 mg Oral BID  . memantine  10 mg Oral BID  . mirtazapine  7.5 mg Oral QHS  . multivitamin with minerals  1 tablet Oral Daily  . sertraline  75 mg Oral QHS  . simvastatin  20 mg Oral q1800   Continuous Infusions: . sodium chloride Stopped (01/02/17 1023)  . dextrose 5 % and 0.9% NaCl 50 mL/hr at 01/01/17 2248  . vancomycin Stopped (01/03/17 1306)     LOS: 1 day    Time spent: 25 minutes. Greater than 50%  of this time was spent in direct contact with the patient coordinating care.     Chaya Jan, MD [facility] Pager [phone]  If 7PM-7AM, please contact night-coverage [email or web address] Password [email or web address] 01/03/2017, 3:20 PM

## 2017-01-04 DIAGNOSIS — A419 Sepsis, unspecified organism: Secondary | ICD-10-CM | POA: Diagnosis not present

## 2017-01-04 DIAGNOSIS — R509 Fever, unspecified: Secondary | ICD-10-CM | POA: Diagnosis not present

## 2017-01-04 DIAGNOSIS — A4101 Sepsis due to Methicillin susceptible Staphylococcus aureus: Secondary | ICD-10-CM | POA: Diagnosis not present

## 2017-01-04 LAB — GLUCOSE, CAPILLARY: Glucose-Capillary: 109 mg/dL — ABNORMAL HIGH (ref 65–99)

## 2017-01-04 NOTE — Progress Notes (Addendum)
PROGRESS NOTE    Kimberly [name]  NWG:[id number] DOB: 1950-08-19 DOA: 12/30/2016 PCP: Pearson GrippeKim, James, MD     Brief Narrative:  66 year old woman admitted to the hospital on 8/7 due to hypotension. She was seen in the ED the day prior for a UTI and given Macrobid and however returned the next day due to hypotension and fever. She was found to have a mild leukocytosis with a WBC count of 13.3. Started on Rocephin and admission was requested for early sepsis due to UTI. Urine culture is positive for enterococcus and 2 out of 2 blood cultures are growing coag-negative staph. Antibiotics have been transitioned over to vancomycin.   Assessment & Plan:   Active Problems:   Chronic diastolic heart failure (HCC)   Memory disorder   Syncope and collapse   Blind   Hypothyroidism   History of CVA (cerebrovascular accident)   Dementia without behavioral disturbance   Urinary retention   Hyperlipidemia   Sepsis (HCC)   MRSA bacteremia   Sepsis -Has culture positive UTI (enterococcus), however is also growing multidrug resistant coag negative staph in the blood -Discussed case via phone with infectious diseases. They have recommended TEE to rule out endocarditis, also repeat blood cultures with placement of PICC and 2 versus 4 weeks of antibiotics pending results of TEE. -Discussed with cardiology. Unable to perform TEE given she cannot consent to procedure given her dementia and multiple attempts to reach family members through all 3 numbers that we have have been unsuccessful. Procedure has been canceled. -Sepsis parameters have resolved. -Cultures from 8/9 remain negative, will place PICC line today and anticipate discharge SNF in a.m with a planned 4 weeks of vancomycin -2-D echo done without evidence of endocarditis.  Dementia -At baseline, has had some restlessness.  Hypothyroidism -Continue Synthroid  Hyperlipidemia -Continue statin   DVT prophylaxis: Subcutaneous heparin  Code  Status: Full code  Family Communication: Patient only  Disposition Plan: SNF once medically ready, anticipate 24 hours.  Consultants:  None   Procedures:  None   Antimicrobials:  Anti-infectives    Start     Dose/Rate Route Frequency Ordered Stop   01/02/17 2300  vancomycin (VANCOCIN) IVPB 750 mg/150 ml premix     750 mg 150 mL/hr over 60 Minutes Intravenous Every 12 hours 01/02/17 1134     01/02/17 1200  vancomycin (VANCOCIN) IVPB 1000 mg/200 mL premix     1,000 mg 200 mL/hr over 60 Minutes Intravenous  Once 01/02/17 1136 01/02/17 1317   12/30/16 2300  vancomycin (VANCOCIN) 500 mg in sodium chloride 0.9 % 100 mL IVPB  Status:  Discontinued     500 mg 100 mL/hr over 60 Minutes Intravenous Every 12 hours 12/30/16 1513 01/02/17 1134   12/30/16 1800  cefTRIAXone (ROCEPHIN) 1 g in dextrose 5 % 50 mL IVPB  Status:  Discontinued     1 g 100 mL/hr over 30 Minutes Intravenous Every 24 hours 12/30/16 1321 12/31/16 1808   12/30/16 1015  vancomycin (VANCOCIN) IVPB 1000 mg/200 mL premix     1,000 mg 200 mL/hr over 60 Minutes Intravenous  Once 12/30/16 1000 12/30/16 1201   12/30/16 1015  piperacillin-tazobactam (ZOSYN) IVPB 3.375 g     3.375 g 100 mL/hr over 30 Minutes Intravenous  Once 12/30/16 1000 12/30/16 1059       Subjective: Is awake today, has no complaints appears situationally confused.  Objective: Vitals:   01/03/17 1527 01/03/17 2123 01/04/17 0453 01/04/17 1300  BP: 115/65 139/74 128/67 Marland Kitchen(!)  148/70  Pulse: 84 74 71 79  Resp: 20 18 18 18   Temp: 98.8 F (37.1 C) 98.4 F (36.9 C) 98.4 F (36.9 C) 98.3 F (36.8 C)  TempSrc: Oral Oral Oral Oral  SpO2: 96% 95% 96% 94%  Weight:      Height:        Intake/Output Summary (Last 24 hours) at 01/04/17 1609 Last data filed at 01/04/17 1500  Gross per 24 hour  Intake          3476.67 ml  Output             1200 ml  Net          2276.67 ml   Filed Weights   12/30/16 0937  Weight: 63.5 kg (140 lb)     Examination:  General exam: Awake, confused Respiratory system: Clear to auscultation. Respiratory effort normal. Cardiovascular system:RRR. No murmurs, rubs, gallops. Gastrointestinal system: Abdomen is nondistended, soft and nontender. No organomegaly or masses felt. Normal bowel sounds heard. Central nervous system: Alert and oriented. No focal neurological deficits. Extremities: 1+ pitting edema bilaterally, significant edema of left upper extremity, however improved from yesterday Skin: No rashes, lesions or ulcers Psychiatry: Unable to fully assess given current mental state, mood appears stable      Data Reviewed: I have personally reviewed following labs and imaging studies  CBC:  Recent Labs Lab 12/29/16 1132 12/30/16 0951 01/01/17 0554 01/02/17 0642  WBC 15.2* 11.6* 6.3 5.1  NEUTROABS 13.3* 9.2*  --   --   HGB 13.1 11.4* 10.8* 11.6*  HCT 41.6 36.6 35.2* 36.0  MCV 89.7 90.8 89.8 89.1  PLT 353 317 299 322   Basic Metabolic Panel:  Recent Labs Lab 12/29/16 1132 12/30/16 0951 01/01/17 0554 01/02/17 0642  NA 141 142 144 143  K 4.2 3.3* 3.2* 3.6  CL 105 111 114* 111  CO2 27 23 26 26   GLUCOSE 123* 132* 95 86  BUN 24* 14 9 5*  CREATININE 1.18* 0.84 0.65 0.60  CALCIUM 9.5 8.8* 8.1* 8.3*   GFR: Estimated Creatinine Clearance: 63.9 mL/min (by C-G formula based on SCr of 0.6 mg/dL). Liver Function Tests:  Recent Labs Lab 12/29/16 1132 12/30/16 0951  AST 23 19  ALT 19 16  ALKPHOS 58 49  BILITOT 0.4 0.5  PROT 7.0 6.2*  ALBUMIN 3.6 3.1*    Recent Labs Lab 12/29/16 1132  LIPASE 39   No results for input(s): AMMONIA in the last 168 hours. Coagulation Profile:  Recent Labs Lab 12/30/16 0951  INR 1.04   Cardiac Enzymes:  Recent Labs Lab 12/29/16 1132  TROPONINI 0.05*   BNP (last 3 results) No results for input(s): PROBNP in the last 8760 hours. HbA1C: No results for input(s): HGBA1C in the last 72 hours. CBG: No results for  input(s): GLUCAP in the last 168 hours. Lipid Profile: No results for input(s): CHOL, HDL, LDLCALC, TRIG, CHOLHDL, LDLDIRECT in the last 72 hours. Thyroid Function Tests: No results for input(s): TSH, T4TOTAL, FREET4, T3FREE, THYROIDAB in the last 72 hours. Anemia Panel: No results for input(s): VITAMINB12, FOLATE, FERRITIN, TIBC, IRON, RETICCTPCT in the last 72 hours. Urine analysis:    Component Value Date/Time   COLORURINE RED (A) 12/30/2016 0951   APPEARANCEUR CLOUDY (A) 12/30/2016 0951   LABSPEC <1.005 (L) 12/30/2016 0951   PHURINE >9.0 (H) 12/30/2016 0951   GLUCOSEU NEGATIVE 12/30/2016 0951   HGBUR LARGE (A) 12/30/2016 0951   BILIRUBINUR NEGATIVE 12/30/2016 0951   KETONESUR NEGATIVE  12/30/2016 0951   PROTEINUR >300 (A) 12/30/2016 0951   UROBILINOGEN 0.2 11/02/2013 1320   NITRITE NEGATIVE 12/30/2016 0951   LEUKOCYTESUR LARGE (A) 12/30/2016 0951   Sepsis Labs: @LABRCNTIP (procalcitonin:4,lacticidven:4)  ) Recent Results (from the past 240 hour(s))  Urine culture     Status: Abnormal   Collection Time: 12/29/16  2:23 PM  Result Value Ref Range Status   Specimen Description URINE, CLEAN CATCH  Final   Special Requests NONE  Final   Culture >=100,000 COLONIES/mL ENTEROCOCCUS FAECALIS (A)  Final   Report Status 01/01/2017 FINAL  Final   Organism ID, Bacteria ENTEROCOCCUS FAECALIS (A)  Final      Susceptibility   Enterococcus faecalis - MIC*    AMPICILLIN <=2 SENSITIVE Sensitive     LEVOFLOXACIN >=8 RESISTANT Resistant     NITROFURANTOIN <=16 SENSITIVE Sensitive     VANCOMYCIN 1 SENSITIVE Sensitive     * >=100,000 COLONIES/mL ENTEROCOCCUS FAECALIS  Blood culture (routine x 2)     Status: Abnormal   Collection Time: 12/29/16  3:08 PM  Result Value Ref Range Status   Specimen Description BLOOD RIGHT ARM  Final   Special Requests   Final    BOTTLES DRAWN AEROBIC ONLY Blood Culture results may not be optimal due to an inadequate volume of blood received in culture bottles    Culture  Setup Time   Final    GRAM POSITIVE COCCI Gram Stain Report Called to,Read Back By and Verified With: CARDWELL L. AT 1023A ON [id number] BY THOMPSON S.    Culture STAPHYLOCOCCUS SPECIES (COAGULASE NEGATIVE) (A)  Final   Report Status 01/01/2017 FINAL  Final   Organism ID, Bacteria STAPHYLOCOCCUS SPECIES (COAGULASE NEGATIVE)  Final      Susceptibility   Staphylococcus species (coagulase negative) - MIC*    CIPROFLOXACIN 4 RESISTANT Resistant     ERYTHROMYCIN >=8 RESISTANT Resistant     GENTAMICIN <=0.5 SENSITIVE Sensitive     OXACILLIN >=4 RESISTANT Resistant     TETRACYCLINE 4 SENSITIVE Sensitive     VANCOMYCIN 1 SENSITIVE Sensitive     TRIMETH/SULFA 80 RESISTANT Resistant     CLINDAMYCIN RESISTANT Resistant     RIFAMPIN <=0.5 SENSITIVE Sensitive     Inducible Clindamycin POSITIVE Resistant     * STAPHYLOCOCCUS SPECIES (COAGULASE NEGATIVE)  Blood Culture ID Panel (Reflexed)     Status: Abnormal   Collection Time: 12/29/16  3:08 PM  Result Value Ref Range Status   Enterococcus species NOT DETECTED NOT DETECTED Final   Listeria monocytogenes NOT DETECTED NOT DETECTED Final   Staphylococcus species DETECTED (A) NOT DETECTED Final    Comment: Methicillin (oxacillin) resistant coagulase negative staphylococcus. Possible blood culture contaminant (unless isolated from more than one blood culture draw or clinical case suggests pathogenicity). No antibiotic treatment is indicated for blood  culture contaminants. CRITICAL RESULT CALLED TO, READ BACK BY AND VERIFIED WITH: LBoykin Reaper.D. 14:45 12/30/16 (wilsonm)    Staphylococcus aureus NOT DETECTED NOT DETECTED Final   Methicillin resistance DETECTED (A) NOT DETECTED Final    Comment: CRITICAL RESULT CALLED TO, READ BACK BY AND VERIFIED WITH: LBoykin Reaper.D. 14:45 12/30/16 (wilsonm)    Streptococcus species NOT DETECTED NOT DETECTED Final   Streptococcus agalactiae NOT DETECTED NOT DETECTED Final   Streptococcus pneumoniae NOT  DETECTED NOT DETECTED Final   Streptococcus pyogenes NOT DETECTED NOT DETECTED Final   Acinetobacter baumannii NOT DETECTED NOT DETECTED Final   Enterobacteriaceae species NOT DETECTED NOT DETECTED Final   Enterobacter cloacae  complex NOT DETECTED NOT DETECTED Final   Escherichia coli NOT DETECTED NOT DETECTED Final   Klebsiella oxytoca NOT DETECTED NOT DETECTED Final   Klebsiella pneumoniae NOT DETECTED NOT DETECTED Final   Proteus species NOT DETECTED NOT DETECTED Final   Serratia marcescens NOT DETECTED NOT DETECTED Final   Haemophilus influenzae NOT DETECTED NOT DETECTED Final   Neisseria meningitidis NOT DETECTED NOT DETECTED Final   Pseudomonas aeruginosa NOT DETECTED NOT DETECTED Final   Candida albicans NOT DETECTED NOT DETECTED Final   Candida glabrata NOT DETECTED NOT DETECTED Final   Candida krusei NOT DETECTED NOT DETECTED Final   Candida parapsilosis NOT DETECTED NOT DETECTED Final   Candida tropicalis NOT DETECTED NOT DETECTED Final    Comment: Performed at [facility], [address], [place], [place] [address]  Blood culture (routine x 2)     Status: Abnormal   Collection Time: 12/29/16  3:10 PM  Result Value Ref Range Status   Specimen Description BLOOD LEFT HAND  Final   Special Requests   Final    BOTTLES DRAWN AEROBIC ONLY Blood Culture results may not be optimal due to an inadequate volume of blood received in culture bottles   Culture  Setup Time   Final    GRAM POSITIVE COCCI Gram Stain Report Called to,Read Back By and Verified With: CARDWELL L. AT 1023A ON [id number] BY THOMPSON S.    Culture (A)  Final    STAPHYLOCOCCUS SPECIES (COAGULASE NEGATIVE) SUSCEPTIBILITIES PERFORMED ON PREVIOUS CULTURE WITHIN THE LAST 5 DAYS. Performed at [facility], [address], [place], [place] [address]    Report Status 01/01/2017 FINAL  Final  Culture, blood (Routine X 2) w Reflex to ID Panel     Status: None (Preliminary result)   Collection Time: 01/01/17   9:15 PM  Result Value Ref Range Status   Specimen Description BLOOD LEFT WRIST  Final   Special Requests   Final    BOTTLES DRAWN AEROBIC ONLY Blood Culture results may not be optimal due to an inadequate volume of blood received in culture bottles   Culture NO GROWTH 3 DAYS  Final   Report Status PENDING  Incomplete  Culture, blood (Routine X 2) w Reflex to ID Panel     Status: None (Preliminary result)   Collection Time: 01/01/17  9:15 PM  Result Value Ref Range Status   Specimen Description LEFT ANTECUBITAL  Final   Special Requests   Final    BOTTLES DRAWN AEROBIC AND ANAEROBIC Blood Culture results may not be optimal due to an inadequate volume of blood received in culture bottles   Culture NO GROWTH 3 DAYS  Final   Report Status PENDING  Incomplete  Culture, blood (Routine X 2) w Reflex to ID Panel     Status: None (Preliminary result)   Collection Time: 01/02/17  3:30 PM  Result Value Ref Range Status   Specimen Description BLOOD LEFT HAND  Final   Special Requests   Final    BOTTLES DRAWN AEROBIC AND ANAEROBIC Blood Culture results may not be optimal due to an inadequate volume of blood received in culture bottles   Culture NO GROWTH 2 DAYS  Final   Report Status PENDING  Incomplete         Radiology Studies: No results found.      Scheduled Meds: . acetaminophen  650 mg Oral BID  . ARIPiprazole  2 mg Oral QHS  . cholecalciferol  800 Units  Oral Daily  . donepezil  5 mg Oral QHS  . heparin  5,000 Units Subcutaneous Q8H  . levothyroxine  25 mcg Oral QAC breakfast  . LORazepam  0.5 mg Oral BID  . memantine  10 mg Oral BID  . mirtazapine  7.5 mg Oral QHS  . multivitamin with minerals  1 tablet Oral Daily  . sertraline  75 mg Oral QHS  . simvastatin  20 mg Oral q1800   Continuous Infusions: . sodium chloride Stopped (01/02/17 1023)  . dextrose 5 % and 0.9% NaCl 50 mL/hr at 01/01/17 2248  . vancomycin Stopped (01/04/17 1147)     LOS: 2 days    Time spent:  25 minutes. Greater than 50% of this time was spent in direct contact with the patient coordinating care.     Chaya Jan, MD [facility] Pager [phone]  If 7PM-7AM, please contact night-coverage [email or web address] Password [facility] 01/04/2017, 4:09 PM

## 2017-01-04 NOTE — Progress Notes (Addendum)
Staff has called and left two messages with daughters and messages for other contact individuals concerning the patient's need for a PICC line. The messages contained the information on what a PICC line is, that a consent is needed and the importance of the patient receiving one today. There has not been a response from any of the contacts to the numbers left, MD aware. Patient's sister-in-law called and gave phone consent for PICC line at 1225, witnessed by 2 RNs and MD informed.

## 2017-01-04 NOTE — Plan of Care (Signed)
Problem: Safety: Goal: Ability to remain free from injury will improve Outcome: Adequate for Discharge Pt on high fall risk d/t medications and blind in both eyes. Pts bed in lowest locked position, SR up x3, call bell within reach. Pt educated to call in pt needs anything and not to try to get up. Pt verbalized understanding, but needs reinforcement d/t being confused. Will continue to monitor pt

## 2017-01-05 DIAGNOSIS — A419 Sepsis, unspecified organism: Secondary | ICD-10-CM | POA: Diagnosis not present

## 2017-01-05 DIAGNOSIS — R509 Fever, unspecified: Secondary | ICD-10-CM | POA: Diagnosis not present

## 2017-01-05 DIAGNOSIS — I5032 Chronic diastolic (congestive) heart failure: Secondary | ICD-10-CM | POA: Diagnosis not present

## 2017-01-05 LAB — BASIC METABOLIC PANEL
Anion gap: 10 (ref 5–15)
BUN: 12 mg/dL (ref 6–20)
CO2: 27 mmol/L (ref 22–32)
CREATININE: 0.63 mg/dL (ref 0.44–1.00)
Calcium: 8.4 mg/dL — ABNORMAL LOW (ref 8.9–10.3)
Chloride: 105 mmol/L (ref 101–111)
Glucose, Bld: 91 mg/dL (ref 65–99)
POTASSIUM: 3.8 mmol/L (ref 3.5–5.1)
SODIUM: 142 mmol/L (ref 135–145)

## 2017-01-05 MED ORDER — VANCOMYCIN HCL IN DEXTROSE 750-5 MG/150ML-% IV SOLN: 750.0000 mg | Freq: Two times a day (BID) | INTRAVENOUS | Status: AC

## 2017-01-05 NOTE — Clinical Social Work Note (Signed)
LCSW left a message for patient's daughter, Hilbert CorriganLorrie, advising that discharge planning decisions needed to be made and requesting return contact.     Binta Statzer, Juleen ChinaHeather D, LCSW

## 2017-01-05 NOTE — Progress Notes (Signed)
Pt. [facility] [place]. Report called to Harriette OharaBarbara Roberts. New PICC inserted by vascular nurse and wrapped with Kerlix for protection.

## 2017-01-05 NOTE — Care Management Note (Signed)
Case Management Note  Patient Details  Name: Rae MarSara L Nau MRN: [id number] Date of Birth: 1950-06-18  Expected Discharge Date:  01/05/17               Expected Discharge Plan:  Skilled Nursing Facility  In-House Referral:  Clinical Social Work  Discharge planning Services  CM Consult  Post Acute Care Choice:  NA Choice offered to:  NA  Status of Service:  Completed, signed off  Additional Comments: Discharging [facility] today. CM has updated [facility] rep on DC plan. CSW has made SNF arrangements.   Malcolm Metrohildress, Yamilka Lopiccolo Demske, RN 01/05/2017, 1:45 PM

## 2017-01-05 NOTE — NC FL2 (Signed)
[place] MEDICAID FL2 LEVEL OF CARE SCREENING TOOL     IDENTIFICATION  Patient Name: Kimberly [name] Birthdate: [birth year] Sex: female Admission Date (Current Location): 12/30/2016  [facility] and [place] Number:  [employer] and Address:  [facility],  [address], [place] [address]      Provider Number: [id number]  Attending Physician Name and Address:  Philip Aspen, Minerva Ends*  Relative Name and Phone Number:       Current Level of Care: Hospital Recommended Level of Care: Skilled Nursing Facility Prior Approval Number:    Date Approved/Denied:   PASRR Number: [id number] ([id number])  Discharge Plan: SNF    Current Diagnoses: Patient Active Problem List   Diagnosis Date Noted  . MRSA bacteremia 12/31/2016  . Sepsis (HCC) 12/30/2016  . Urinary retention 07/18/2016  . Essential hypertension 07/18/2016  . Hyperlipidemia 07/18/2016  . Ischemic stroke (HCC) - L insular embolic infarct s/p tPA, source unknown 07/14/2016  . Acute right hemiparesis (HCC)   . Dysarthria   . Dementia without behavioral disturbance   . Syncope 11/02/2013  . Syncope and collapse 11/02/2013  . Blind 11/02/2013  . Hypothyroidism 11/02/2013  . History of CVA (cerebrovascular accident) 11/02/2013  . Bradycardia 11/02/2013  . Tobacco abuse 11/02/2013  . Memory disorder 08/02/2013  . Abnormality of gait 08/02/2013  . Chronic diastolic heart failure (HCC) 12/19/2011  . ANEMIA 05/04/2009  . LEUKOCYTOSIS UNSPECIFIED 05/04/2009  . 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 ABDOMINAL PAIN, LOWER 05/04/2009    Orientation RESPIRATION BLADDER Height & Weight     Self  Normal Indwelling catheter Weight: 140 lb (63.5 kg) Height:  5\' 6"  (167.6 cm)  BEHAVIORAL SYMPTOMS/MOOD NEUROLOGICAL BOWEL NUTRITION STATUS      Continent Diet (see discharge plan)  AMBULATORY STATUS COMMUNICATION OF NEEDS Skin   Limited Assist Verbally Normal                       Personal  Care Assistance Level of Assistance  Bathing, Dressing, Feeding Bathing Assistance: Limited assistance Feeding assistance: Limited assistance Dressing Assistance: Limited assistance     Functional Limitations Info  Sight, Speech, Hearing Sight Info: Adequate Hearing Info: Adequate Speech Info: Adequate    SPECIAL CARE FACTORS FREQUENCY                       Contractures Contractures Info: Not present    Additional Factors Info  Code Status Code Status Info: Full Code   Psychotropic Info: Abilify, Ativan, Remeron, zoloft         Current Medications (01/05/2017):  This is the current hospital active medication list Current Facility-Administered Medications  Medication Dose Route Frequency Provider Last Rate Last Dose  . 0.9 %  sodium chloride infusion   Intravenous Continuous Antoine Poche, MD   Stopped at 01/02/17 1023  . acetaminophen (TYLENOL) tablet 650 mg  650 mg Oral Q6H PRN Houston Siren, MD       Or  . acetaminophen (TYLENOL) suppository 650 mg  650 mg Rectal Q6H PRN Houston Siren, MD      . acetaminophen (TYLENOL) tablet 650 mg  650 mg Oral BID Houston Siren, MD   650 mg at 01/05/17 1014  . ARIPiprazole (ABILIFY) tablet 2 mg  2 mg Oral QHS Houston Siren, MD   2 mg at 01/04/17 2037  . cholecalciferol (VITAMIN D) tablet 800 Units  800 Units Oral Daily Houston Siren, MD   800 Units at  01/05/17 1014  . dextrose 5 %-0.9 % sodium chloride infusion   Intravenous Continuous Houston SirenLe, Peter, MD 50 mL/hr at 01/01/17 2248    . donepezil (ARICEPT) tablet 5 mg  5 mg Oral QHS Houston SirenLe, Peter, MD   5 mg at 01/04/17 2035  . heparin injection 5,000 Units  5,000 Units Subcutaneous Q8H Houston SirenLe, Peter, MD   5,000 Units at 01/05/17 [id number]  . hydrocerin (EUCERIN) cream   Topical PRN Houston SirenLe, Peter, MD      . levothyroxine (SYNTHROID, LEVOTHROID) tablet 25 mcg  25 mcg Oral QAC breakfast Houston SirenLe, Peter, MD   25 mcg at 01/05/17 [phone]  . LORazepam (ATIVAN) tablet 0.5 mg  0.5 mg Oral BID Houston SirenLe, Peter, MD   0.5 mg at 01/05/17 1014  .  memantine (NAMENDA) tablet 10 mg  10 mg Oral BID Houston SirenLe, Peter, MD   10 mg at 01/05/17 1014  . mirtazapine (REMERON) tablet 7.5 mg  7.5 mg Oral QHS Houston SirenLe, Peter, MD   7.5 mg at 01/04/17 2034  . multivitamin with minerals tablet 1 tablet  1 tablet Oral Daily Houston SirenLe, Peter, MD   1 tablet at 01/05/17 1014  . sertraline (ZOLOFT) tablet 75 mg  75 mg Oral QHS Houston SirenLe, Peter, MD   75 mg at 01/04/17 2035  . simvastatin (ZOCOR) tablet 20 mg  20 mg Oral q1800 Houston SirenLe, Peter, MD   20 mg at 01/04/17 1726  . vancomycin (VANCOCIN) IVPB 750 mg/150 ml premix  750 mg Intravenous Q12H Philip AspenHernandez Acosta, Limmie PatriciaEstela Y, MD   Stopped at 01/05/17 0121     Discharge Medications: Please see discharge summary for a list of discharge medications.  Relevant Imaging Results:  Relevant Lab Results:   Additional Information SSN: [id number]. PATIENT WILL NEED IV VANCOMYCIN TWICE A DAY THROUGH January 27, 2017.   Chin Wachter, Juleen ChinaHeather D, LCSW

## 2017-01-05 NOTE — Clinical Social Work Note (Signed)
LCSW left a second message for patient's daughters Kimberly [name] and Kimberly [name]. LCSW advised of discharge, [facility] and the need for IV antibiotics through 01/27/17.  LCSW requested return contact.   LCSW sent clinicals through [employer]. LCSW signing off.      Emerly Prak, Juleen ChinaHeather D, LCSW

## 2017-01-05 NOTE — Progress Notes (Signed)
This Clinical research associatewriter entered into patients room and observed PICC line wrapped up in patients hand, cathetar tip in place, patient confused, oriented to self only, right hand PIV still remains, hospitalist will be made aware.

## 2017-01-05 NOTE — Clinical Social Work Note (Signed)
LCSW left a message with [facility]CD[facility]/APS requesting return contact in reference to a report re: patient needing a guardian as family cannot be contacted.      Kimberly [name], Juleen ChinaHeather D, LCSW

## 2017-01-05 NOTE — Care Management Important Message (Signed)
Important Message  Patient Details  Name: Kimberly [name] MRN: [id number] Date of Birth: 03-05-1951   Medicare Important Message Given:  Yes    Malcolm MetroChildress, Shebra Muldrow Demske, RN 01/05/2017, 1:43 PM

## 2017-01-05 NOTE — Discharge Summary (Signed)
Physician Discharge Summary  Kimberly [name] ZOX:[id number] DOB: 1950/10/02 DOA: 12/30/2016  PCP: Pearson Grippe, MD  Admit date: 12/30/2016 Discharge date: 01/05/2017  Time spent: 45 minutes  Recommendations for Outpatient Follow-up:  -To be discharged to skilled nursing facility today. -Will need to continue vancomycin through a PICC line until 9/4.   Discharge Diagnoses:  Active Problems:   Chronic diastolic heart failure (HCC)   Memory disorder   Syncope and collapse   Blind   Hypothyroidism   History of CVA (cerebrovascular accident)   Dementia without behavioral disturbance   Urinary retention   Hyperlipidemia   Sepsis (HCC)   MRSA bacteremia   Discharge Condition: Stable and improved  Filed Weights   12/30/16 0937  Weight: 63.5 kg (140 lb)    History of present illness:  As per Dr. Conley Rolls on 8/7: [name] is an 66 y.o. female with hx of dementia, COPD, CHF, hypothyroidism, prior CVA, seen yesterday for UTI, given Macrodantin, returned to the ER with hypotension and fever.  Work up in the ER included mild leukocytosis with WBC of 12K, and K of 3.3 mE/L.  She was given IVF, and her BP responded.  Though she is confused with dementia, she is alert, and was able to answer questions.  She has a foley placed yesterday.  She was started on IV Van/Zosyn and hospitalist was asked to admit her for probably early sepsis with UTI.    Hospital Course:   Sepsis -Has culture positive UTI (enterococcus), however is also growing multidrug resistant coag negative staph in the blood -Discussed case via phone with infectious diseases. They have recommended TEE to rule out endocarditis, also repeat blood cultures with placement of PICC and 2 versus 4 weeks of antibiotics pending results of TEE. -Discussed with cardiology. Unable to perform TEE given she cannot consent to procedure given her dementia and multiple attempts to reach family members through all 3 numbers that we have have been  unsuccessful. Procedure has been canceled. -Sepsis parameters have resolved. -Cultures from 8/9 remain negative,  PICC line has been placed today for a planned 4 week course of vancomycin to be completed on 01/27/17. -2-D echo done without evidence of endocarditis.  Dementia -At baseline, has had some restlessness.  Hypothyroidism -Continue Synthroid  Hyperlipidemia -Continue statin   Procedures:  PICC line placement   Consultations:  None  Discharge Instructions  Discharge Instructions    Diet - low sodium heart healthy    Complete by:  As directed    Increase activity slowly    Complete by:  As directed      Allergies as of 01/05/2017   No Known Allergies     Medication List    STOP taking these medications   eucerin cream   nitrofurantoin (macrocrystal-monohydrate) 100 MG capsule Commonly known as:  MACROBID   ondansetron 4 MG disintegrating tablet Commonly known as:  ZOFRAN ODT     TAKE these medications   acetaminophen 650 MG CR tablet Commonly known as:  TYLENOL Take 650 mg by mouth 2 (two) times daily.   acetaminophen 325 MG tablet Commonly known as:  TYLENOL Take 325 mg by mouth daily.   ARIPiprazole 2 MG tablet Commonly known as:  ABILIFY Take 2 mg by mouth at bedtime.   cholecalciferol 400 units Tabs tablet Commonly known as:  VITAMIN D Take 800 Units by mouth daily.   DAILY-VITE PO Take 1 tablet by mouth daily.   donepezil 5 MG tablet  Commonly known as:  ARICEPT TAKE 1 TABLET BY MOUTH AT BEDTIME.   ICAPS AREDS FORMULA PO Take 1 tablet by mouth daily.   levothyroxine 25 MCG tablet Commonly known as:  SYNTHROID, LEVOTHROID Take 25 mcg by mouth daily.   LORazepam 0.5 MG tablet Commonly known as:  ATIVAN Take 0.5 mg by mouth 2 (two) times daily.   memantine 10 MG tablet Commonly known as:  NAMENDA Take 10 mg by mouth 2 (two) times daily.   mirtazapine 15 MG tablet Commonly known as:  REMERON Take 7.5 mg by mouth at  bedtime.   sertraline 50 MG tablet Commonly known as:  ZOLOFT Take 75 mg by mouth at bedtime.   simvastatin 40 MG tablet Commonly known as:  ZOCOR Take 20 mg by mouth daily at 6 PM.   Vancomycin 750-5 MG/150ML-% Soln Commonly known as:  VANCOCIN Inject 150 mLs (750 mg total) into the vein every 12 (twelve) hours.      No Known Allergies    The results of significant diagnostics from this hospitalization (including imaging, microbiology, ancillary and laboratory) are listed below for reference.    Significant Diagnostic Studies: Ct Abdomen Pelvis W Contrast  Result Date: 12/29/2016 CLINICAL DATA:  66 year old female with history of vomiting this morning. EXAM: CT ABDOMEN AND PELVIS WITH CONTRAST TECHNIQUE: Multidetector CT imaging of the abdomen and pelvis was performed using the standard protocol following bolus administration of intravenous contrast. CONTRAST:  ISOVUE-300 IOPAMIDOL (ISOVUE-300) INJECTION 61% COMPARISON:  CT the abdomen and pelvis 03/22/2011. FINDINGS: Lower chest: Unremarkable. Hepatobiliary: 5 mm low-attenuation lesion in segment 7 of the liver is too small to characterize, but is statistically likely a tiny cyst. No other suspicious appearing hepatic lesions are noted. No intra or extrahepatic biliary ductal dilatation. 11 mm rim calcified gallstone in the gallbladder. No findings to suggest an acute cholecystitis at this time. Pancreas: No pancreatic mass. No pancreatic ductal dilatation. No pancreatic or peripancreatic fluid or inflammatory changes. Spleen: Unremarkable. Adrenals/Urinary Tract: 1.9 cm simple cyst in the upper pole the left kidney. Other subcentimeter low-attenuation lesions in the left kidney are too small to definitively characterize, but are statistically likely tiny cysts. Mild right-sided hydroureteronephrosis which extends into the proximal anatomic pelvis where the ureter becomes indistinguishable from a soft tissue lesion measures  approximately 3.0 x 1.8 x 2.5 cm (axial image 44 of series 2 and coronal image 42 of series 5) along the right pelvic side wall immediately adjacent to the bifurcation of the right common iliac artery, which appears to represent the patient's right ovary. No left-sided hydroureteronephrosis. Foley balloon catheter in the lumen of the urinary bladder which demonstrates markedly thickened trabeculated wall with avid mucosal enhancement, and surrounding inflammatory changes and trace volume of fluid in the immediate perivesical fat. Bilateral adrenal glands are normal in appearance. Stomach/Bowel: The appearance of the stomach is normal. There is no pathologic dilatation of small bowel or colon. Numerous colonic diverticulae are noted, particularly in the descending colon and sigmoid colon. No definite surrounding inflammatory changes to suggest an acute diverticulitis at this time. Vascular/Lymphatic: Aortic atherosclerosis, without evidence of aneurysm or dissection in the abdominal or pelvic vasculature. No definite lymphadenopathy identified in the abdomen or pelvis. Reproductive: Uterus and left ovary are atrophic. Right ovary is noted along the right pelvic side wall (as above) Other: Trace amount of perivesical fluid in the low anatomic pelvis. No larger volume of ascites. No pneumoperitoneum. Musculoskeletal: Chronic appearing compression fractures at T11 and T12, most  severe T12 where there is 50% loss of anterior vertebral body height. There are no aggressive appearing lytic or blastic lesions noted in the visualized portions of the skeleton. IMPRESSION: 1. Urinary bladder appears markedly thick walled, heavily trabeculated, with increased enhancement of the mucosa, and extensive surrounding inflammatory changes, concerning for severe cystitis. There is some mild right-sided hydroureteronephrosis. Notably, there is an indwelling Foley catheter, which is unusual given the moderate distention of the urinary  bladder at the time of the examination. Clinical correlation for potential catheter dysfunction is recommended. 2. Colonic diverticulosis without evidence of acute diverticulitis at this time. 3. Cholelithiasis without evidence of acute cholecystitis at this time. 4. Aortic atherosclerosis. Aortic Atherosclerosis (ICD10-I70.0). Electronically Signed   By: Trudie Reed M.D.   On: 12/29/2016 13:24   Dg Chest Portable 1 View  Result Date: 12/30/2016 CLINICAL DATA:  Fever. EXAM: PORTABLE CHEST 1 VIEW COMPARISON:  Chest x-ray dated November 02, 2013. FINDINGS: The cardiomediastinal silhouette is normal in size. Normal pulmonary vascularity. No focal consolidation, pleural effusion, or pneumothorax. Bibasilar atelectasis. No acute osseous abnormality. IMPRESSION: No active cardiopulmonary disease. Electronically Signed   By: Obie Dredge M.D.   On: 12/30/2016 10:42    Microbiology: Recent Results (from the past 240 hour(s))  Urine culture     Status: Abnormal   Collection Time: 12/29/16  2:23 PM  Result Value Ref Range Status   Specimen Description URINE, CLEAN CATCH  Final   Special Requests NONE  Final   Culture >=100,000 COLONIES/mL ENTEROCOCCUS FAECALIS (A)  Final   Report Status 01/01/2017 FINAL  Final   Organism ID, Bacteria ENTEROCOCCUS FAECALIS (A)  Final      Susceptibility   Enterococcus faecalis - MIC*    AMPICILLIN <=2 SENSITIVE Sensitive     LEVOFLOXACIN >=8 RESISTANT Resistant     NITROFURANTOIN <=16 SENSITIVE Sensitive     VANCOMYCIN 1 SENSITIVE Sensitive     * >=100,000 COLONIES/mL ENTEROCOCCUS FAECALIS  Blood culture (routine x 2)     Status: Abnormal   Collection Time: 12/29/16  3:08 PM  Result Value Ref Range Status   Specimen Description BLOOD RIGHT ARM  Final   Special Requests   Final    BOTTLES DRAWN AEROBIC ONLY Blood Culture results may not be optimal due to an inadequate volume of blood received in culture bottles   Culture  Setup Time   Final    GRAM POSITIVE  COCCI Gram Stain Report Called to,Read Back By and Verified With: CARDWELL L. AT 1023A ON [id number] BY THOMPSON S.    Culture STAPHYLOCOCCUS SPECIES (COAGULASE NEGATIVE) (A)  Final   Report Status 01/01/2017 FINAL  Final   Organism ID, Bacteria STAPHYLOCOCCUS SPECIES (COAGULASE NEGATIVE)  Final      Susceptibility   Staphylococcus species (coagulase negative) - MIC*    CIPROFLOXACIN 4 RESISTANT Resistant     ERYTHROMYCIN >=8 RESISTANT Resistant     GENTAMICIN <=0.5 SENSITIVE Sensitive     OXACILLIN >=4 RESISTANT Resistant     TETRACYCLINE 4 SENSITIVE Sensitive     VANCOMYCIN 1 SENSITIVE Sensitive     TRIMETH/SULFA 80 RESISTANT Resistant     CLINDAMYCIN RESISTANT Resistant     RIFAMPIN <=0.5 SENSITIVE Sensitive     Inducible Clindamycin POSITIVE Resistant     * STAPHYLOCOCCUS SPECIES (COAGULASE NEGATIVE)  Blood Culture ID Panel (Reflexed)     Status: Abnormal   Collection Time: 12/29/16  3:08 PM  Result Value Ref Range Status   Enterococcus species NOT DETECTED  NOT DETECTED Final   Listeria monocytogenes NOT DETECTED NOT DETECTED Final   Staphylococcus species DETECTED (A) NOT DETECTED Final    Comment: Methicillin (oxacillin) resistant coagulase negative staphylococcus. Possible blood culture contaminant (unless isolated from more than one blood culture draw or clinical case suggests pathogenicity). No antibiotic treatment is indicated for blood  culture contaminants. CRITICAL RESULT CALLED TO, READ BACK BY AND VERIFIED WITH: LBoykin Reaper.D. 14:45 12/30/16 (wilsonm)    Staphylococcus aureus NOT DETECTED NOT DETECTED Final   Methicillin resistance DETECTED (A) NOT DETECTED Final    Comment: CRITICAL RESULT CALLED TO, READ BACK BY AND VERIFIED WITH: LBoykin Reaper.D. 14:45 12/30/16 (wilsonm)    Streptococcus species NOT DETECTED NOT DETECTED Final   Streptococcus agalactiae NOT DETECTED NOT DETECTED Final   Streptococcus pneumoniae NOT DETECTED NOT DETECTED Final   Streptococcus  pyogenes NOT DETECTED NOT DETECTED Final   Acinetobacter baumannii NOT DETECTED NOT DETECTED Final   Enterobacteriaceae species NOT DETECTED NOT DETECTED Final   Enterobacter cloacae complex NOT DETECTED NOT DETECTED Final   Escherichia coli NOT DETECTED NOT DETECTED Final   Klebsiella oxytoca NOT DETECTED NOT DETECTED Final   Klebsiella pneumoniae NOT DETECTED NOT DETECTED Final   Proteus species NOT DETECTED NOT DETECTED Final   Serratia marcescens NOT DETECTED NOT DETECTED Final   Haemophilus influenzae NOT DETECTED NOT DETECTED Final   Neisseria meningitidis NOT DETECTED NOT DETECTED Final   Pseudomonas aeruginosa NOT DETECTED NOT DETECTED Final   Candida albicans NOT DETECTED NOT DETECTED Final   Candida glabrata NOT DETECTED NOT DETECTED Final   Candida krusei NOT DETECTED NOT DETECTED Final   Candida parapsilosis NOT DETECTED NOT DETECTED Final   Candida tropicalis NOT DETECTED NOT DETECTED Final    Comment: Performed at [facility], [address] [place], [place], [place] [address]  Blood culture (routine x 2)     Status: Abnormal   Collection Time: 12/29/16  3:10 PM  Result Value Ref Range Status   Specimen Description BLOOD LEFT HAND  Final   Special Requests   Final    BOTTLES DRAWN AEROBIC ONLY Blood Culture results may not be optimal due to an inadequate volume of blood received in culture bottles   Culture  Setup Time   Final    GRAM POSITIVE COCCI Gram Stain Report Called to,Read Back By and Verified With: CARDWELL L. AT 1023A ON [id number] BY THOMPSON S.    Culture (A)  Final    STAPHYLOCOCCUS SPECIES (COAGULASE NEGATIVE) SUSCEPTIBILITIES PERFORMED ON PREVIOUS CULTURE WITHIN THE LAST 5 DAYS. Performed at [facility], [address] [place], [place] [address]    Report Status 01/01/2017 FINAL  Final  Culture, blood (Routine X 2) w Reflex to ID Panel     Status: None (Preliminary result)   Collection Time: 01/01/17  9:15 PM  Result Value Ref Range Status    Specimen Description BLOOD LEFT WRIST  Final   Special Requests   Final    BOTTLES DRAWN AEROBIC ONLY Blood Culture results may not be optimal due to an inadequate volume of blood received in culture bottles   Culture NO GROWTH 4 DAYS  Final   Report Status PENDING  Incomplete  Culture, blood (Routine X 2) w Reflex to ID Panel     Status: None (Preliminary result)   Collection Time: 01/01/17  9:15 PM  Result Value Ref Range Status   Specimen Description LEFT ANTECUBITAL  Final   Special Requests   Final  BOTTLES DRAWN AEROBIC AND ANAEROBIC Blood Culture results may not be optimal due to an inadequate volume of blood received in culture bottles   Culture NO GROWTH 4 DAYS  Final   Report Status PENDING  Incomplete  Culture, blood (Routine X 2) w Reflex to ID Panel     Status: None (Preliminary result)   Collection Time: 01/02/17  3:30 PM  Result Value Ref Range Status   Specimen Description BLOOD LEFT HAND  Final   Special Requests   Final    BOTTLES DRAWN AEROBIC AND ANAEROBIC Blood Culture results may not be optimal due to an inadequate volume of blood received in culture bottles   Culture NO GROWTH 3 DAYS  Final   Report Status PENDING  Incomplete     Labs: Basic Metabolic Panel:  Recent Labs Lab 12/30/16 0951 01/01/17 0554 01/02/17 0642 01/05/17 0619  NA 142 144 143 142  K 3.3* 3.2* 3.6 3.8  CL 111 114* 111 105  CO2 23 26 26 27   GLUCOSE 132* 95 86 91  BUN 14 9 5* 12  CREATININE 0.84 0.65 0.60 0.63  CALCIUM 8.8* 8.1* 8.3* 8.4*   Liver Function Tests:  Recent Labs Lab 12/30/16 0951  AST 19  ALT 16  ALKPHOS 49  BILITOT 0.5  PROT 6.2*  ALBUMIN 3.1*   No results for input(s): LIPASE, AMYLASE in the last 168 hours. No results for input(s): AMMONIA in the last 168 hours. CBC:  Recent Labs Lab 12/30/16 0951 01/01/17 0554 01/02/17 0642  WBC 11.6* 6.3 5.1  NEUTROABS 9.2*  --   --   HGB 11.4* 10.8* 11.6*  HCT 36.6 35.2* 36.0  MCV 90.8 89.8 89.1  PLT 317 299  322   Cardiac Enzymes: No results for input(s): CKTOTAL, CKMB, CKMBINDEX, TROPONINI in the last 168 hours. BNP: BNP (last 3 results) No results for input(s): BNP in the last 8760 hours.  ProBNP (last 3 results) No results for input(s): PROBNP in the last 8760 hours.  CBG:  Recent Labs Lab 01/04/17 2031  GLUCAP 109*       Signed:  Chaya JanHERNANDEZ ACOSTA,Merrissa Giacobbe  [facility] Pager: [phone] 01/05/2017, 12:17 PM

## 2017-01-06 ENCOUNTER — Encounter (HOSPITAL_COMMUNITY): Payer: Self-pay | Admitting: Cardiology

## 2017-01-06 LAB — CULTURE, BLOOD (ROUTINE X 2)
CULTURE: NO GROWTH
Culture: NO GROWTH

## 2017-01-07 LAB — CULTURE, BLOOD (ROUTINE X 2): Culture: NO GROWTH

## 2017-01-07 NOTE — Clinical Social Work Note (Signed)
LCSW made an APS referral to Kimberly AldermanMelissa [name] at [facility] for patient regarding guardianship as no family was available throughout patient's hospital stay and patient has a dementia diagnosis and is only oriented to self.     Kimberly [name], Kimberly ChinaHeather D, LCSW

## 2017-06-01 ENCOUNTER — Telehealth: Payer: Self-pay | Admitting: Neurology

## 2017-06-01 NOTE — Telephone Encounter (Signed)
Judge StallCarol Anderson-NP [facility] [phone] request return call from Dr Anne HahnWillis at his convenience to discuss plan of care and medications.

## 2017-06-01 NOTE — Telephone Encounter (Signed)
I tried to call the office, the office is closed, I will call tomorrow.  I personally have not seen the patient since 2015, Dr. Pearlean BrownieSethi has seen in April 2018.  I will try to call back tomorrow.

## 2017-06-02 NOTE — Telephone Encounter (Signed)
I called and talked with the nurse practitioner, Dareen PianoAnderson.  The patient has not been seen by myself since 2015, was seen by Dr. Pearlean BrownieSethi in April 2018, but no follow-up appointment has been made.  If they need follow-up, they can reschedule an appointment.  The patient is not getting medications through this office currently.

## 2017-08-29 ENCOUNTER — Emergency Department (HOSPITAL_COMMUNITY)
Admission: EM | Admit: 2017-08-29 | Discharge: 2017-08-29 | Disposition: A | Discharge status: Home / Self Care | Payer: Medicare Other | Attending: Emergency Medicine | Admitting: Emergency Medicine

## 2017-08-29 ENCOUNTER — Other Ambulatory Visit: Payer: Self-pay

## 2017-08-29 ENCOUNTER — Encounter (HOSPITAL_COMMUNITY): Payer: Self-pay | Admitting: *Deleted

## 2017-08-29 DIAGNOSIS — Z79899 Other long term (current) drug therapy: Secondary | ICD-10-CM | POA: Diagnosis not present

## 2017-08-29 DIAGNOSIS — Z8673 Personal history of transient ischemic attack (TIA), and cerebral infarction without residual deficits: Secondary | ICD-10-CM | POA: Insufficient documentation

## 2017-08-29 DIAGNOSIS — Y828 Other medical devices associated with adverse incidents: Secondary | ICD-10-CM | POA: Insufficient documentation

## 2017-08-29 DIAGNOSIS — E039 Hypothyroidism, unspecified: Secondary | ICD-10-CM | POA: Diagnosis not present

## 2017-08-29 DIAGNOSIS — T839XXA Unspecified complication of genitourinary prosthetic device, implant and graft, initial encounter: Secondary | ICD-10-CM | POA: Insufficient documentation

## 2017-08-29 DIAGNOSIS — J449 Chronic obstructive pulmonary disease, unspecified: Secondary | ICD-10-CM | POA: Diagnosis not present

## 2017-08-29 DIAGNOSIS — F0390 Unspecified dementia without behavioral disturbance: Secondary | ICD-10-CM | POA: Insufficient documentation

## 2017-08-29 DIAGNOSIS — Z87891 Personal history of nicotine dependence: Secondary | ICD-10-CM | POA: Insufficient documentation

## 2017-08-29 DIAGNOSIS — R339 Retention of urine, unspecified: Secondary | ICD-10-CM

## 2017-08-29 LAB — URINALYSIS, ROUTINE W REFLEX MICROSCOPIC
Bilirubin Urine: NEGATIVE
Glucose, UA: NEGATIVE mg/dL
HGB URINE DIPSTICK: NEGATIVE
Ketones, ur: NEGATIVE mg/dL
Leukocytes, UA: NEGATIVE
NITRITE: POSITIVE — AB
PROTEIN: NEGATIVE mg/dL
Specific Gravity, Urine: 1.008 (ref 1.005–1.030)
pH: 5 (ref 5.0–8.0)

## 2017-08-29 NOTE — ED Provider Notes (Addendum)
[facility] EMERGENCY DEPARTMENT Provider Note   CSN: [id number] Arrival date & time: 08/29/17  1937     History   Chief Complaint Chief Complaint  Patient presents with  . Urinary Retention    HPI Kimberly [name] is a 67 y.o. female.  HPI Level 5 caveat due to dementia. Sent in from nursing home after pulling out her chronic Foley catheter.  Reportedly also asking to be checked for urinary tract infection.  Patient is demented states she does not want to be here.  States she thinks she has a catheter still in. Past Medical History:  Diagnosis Date  . Abnormality of gait 08/02/2013  . Anemia   . Arthritis    osteoarthritis  . Blind   . CHF (congestive heart failure) (HCC)   . COPD (chronic obstructive pulmonary disease) (HCC)   . DDD (degenerative disc disease)   . Depression   . HOH (hard of hearing)   . Hypothyroidism   . Memory disorder 08/02/2013  . Pneumonia   . Sepsis (HCC)   . Shortness of breath   . Stroke (HCC)   . Tobacco abuse 11/02/2013    Patient Active Problem List   Diagnosis Date Noted  . MRSA bacteremia 12/31/2016  . Sepsis (HCC) 12/30/2016  . Urinary retention 07/18/2016  . Essential hypertension 07/18/2016  . Hyperlipidemia 07/18/2016  . Ischemic stroke (HCC) - L insular embolic infarct s/p tPA, source unknown 07/14/2016  . Acute right hemiparesis (HCC)   . Dysarthria   . Dementia without behavioral disturbance   . Syncope 11/02/2013  . Syncope and collapse 11/02/2013  . Blind 11/02/2013  . Hypothyroidism 11/02/2013  . History of CVA (cerebrovascular accident) 11/02/2013  . Bradycardia 11/02/2013  . Tobacco abuse 11/02/2013  . Memory disorder 08/02/2013  . Abnormality of gait 08/02/2013  . Chronic diastolic heart failure (HCC) 12/19/2011  . ANEMIA 05/04/2009  . LEUKOCYTOSIS UNSPECIFIED 05/04/2009  . 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 ABDOMINAL PAIN, LOWER 05/04/2009    Past Surgical History:  Procedure Laterality Date  . ARM  DEBRIDEMENT     as child  . CATARACT EXTRACTION W/PHACO  04/28/2011   Procedure: CATARACT EXTRACTION PHACO AND INTRAOCULAR LENS PLACEMENT (IOC);  Surgeon: Susa Simmonds;  Location: [facility];  Service: Ophthalmology;  Laterality: Left;  CDE: 5.37  . CESAREAN SECTION    . 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 STRABISMUS SURGERY     age 34  . TEE WITHOUT CARDIOVERSION N/A 01/02/2017   Procedure: TRANSESOPHAGEAL ECHOCARDIOGRAM (TEE);  Surgeon: Antoine Poche, MD;  Location: [facility];  Service: Endoscopy;  Laterality: N/A;     OB History    Gravida  2   Para  2   Term  2   Preterm      AB      Living  2     SAB      TAB      Ectopic      Multiple      Live Births               Home Medications    Prior to Admission medications   Medication Sig Start Date End Date Taking? Authorizing Provider  acetaminophen (TYLENOL) 325 MG tablet Take 325 mg by mouth daily.     [provider]  acetaminophen (TYLENOL) 650 MG CR tablet Take 650 mg by mouth 2 (two)  times daily.    [provider]  ARIPiprazole (ABILIFY) 2 MG tablet Take 2 mg by mouth at bedtime.    [provider]  cholecalciferol (VITAMIN D) 400 UNITS TABS Take 800 Units by mouth daily.      [provider]  donepezil (ARICEPT) 5 MG tablet TAKE 1 TABLET BY MOUTH AT BEDTIME. 01/15/15   York SpanielWillis, Charles K, MD  levothyroxine (SYNTHROID, LEVOTHROID) 25 MCG tablet Take 25 mcg by mouth daily.      [provider]  LORazepam (ATIVAN) 0.5 MG tablet Take 0.5 mg by mouth 2 (two) times daily.     [provider]  memantine (NAMENDA) 10 MG tablet Take 10 mg by mouth 2 (two) times daily.    [provider]  mirtazapine (REMERON) 15 MG tablet Take 7.5 mg by mouth at bedtime.  08/09/16   [provider]  Multiple Vitamin (DAILY-VITE PO) Take 1 tablet by mouth  daily.    [provider]  Multiple Vitamins-Minerals (ICAPS AREDS FORMULA PO) Take 1 tablet by mouth daily.    [provider]  sertraline (ZOLOFT) 50 MG tablet Take 75 mg by mouth at bedtime.     [provider]  simvastatin (ZOCOR) 40 MG tablet Take 20 mg by mouth daily at 6 PM.  08/10/16   [provider]    Family History Family History  Problem Relation Age of Onset  . Diabetes Brother   . Diabetes Brother   . Diabetes Mother   . Cancer - Lung Father   . Colon cancer Unknown        neg hx?     Social History Social History   Tobacco Use  . Smoking status: Former Smoker    Packs/day: 0.50    Types: Cigarettes  . Smokeless tobacco: Never Used  Substance Use Topics  . Alcohol use: No  . Drug use: No     Allergies   Patient has no known allergies.   Review of Systems Review of Systems  Unable to perform ROS: Dementia     Physical Exam Updated Vital Signs BP 124/83   Pulse 91   Temp 98.2 F (36.8 C) (Oral)   Resp 16   Ht 5\' 1"  (1.549 m)   Wt 63.5 kg (140 lb)   SpO2 92%   BMI 26.45 kg/m   Physical Exam  Constitutional: She appears well-developed.  Eyes: Right eye exhibits no discharge. Left eye exhibits no discharge.  Cardiovascular: Normal rate.  Pulmonary/Chest: She has no wheezes. She has no rales.  Abdominal: There is no tenderness.  Genitourinary:  Genitourinary Comments: Foley catheter had been pulled out.  Replaced by nursing.  Musculoskeletal: She exhibits no edema.  Neurological: She is alert.  Patient is awake and pleasant but somewhat demented.  Skin: Capillary refill takes less than 2 seconds.     ED Treatments / Results  Labs (all labs ordered are listed, but only abnormal results are displayed) Labs Reviewed  URINALYSIS, ROUTINE W REFLEX MICROSCOPIC - Abnormal; Notable for the following components:      Result Value   Nitrite POSITIVE (*)    Bacteria, UA MANY (*)    Squamous Epithelial / LPF  0-5 (*)    All other components within normal limits  URINE CULTURE    EKG None  Radiology No results found.  Procedures Procedures (including critical care time)  Medications Ordered in ED Medications - No data to display   Initial Impression / Assessment  and Plan / ED Course  I have reviewed the triage vital signs and the nursing notes.  Pertinent labs & imaging results that were available during my care of the patient were reviewed by me and considered in my medical decision making (see chart for details).     Patient sent in for replacement of Foley catheter.  Rest home also reportedly wanted check for urinary tract infection although I do not know if she is been having any symptoms as patient cannot tell me.  With her chronic catheter she easily could be colonized did not have an actual infection.  No fever here.  Urine was nonspecific.  Culture sent.  However patient's primary care doctor will need to evaluate both the culture results and potentially if this is an infection versus colonization.  Discharge back to nursing home. Patient has had multidrug-resistant organisms in the past and could potentially require a more complex treatment plan. Final Clinical Impressions(s) / ED Diagnoses   Final diagnoses:  Urinary retention  Complication of Foley catheter, initial encounter [facility])    ED Discharge Orders    None       Benjiman Core, MD 08/29/17 2206    Benjiman Core, MD 08/29/17 2207

## 2017-08-29 NOTE — ED Notes (Signed)
Bladder scanner revealed > prior to foley insertion.

## 2017-08-29 NOTE — Discharge Instructions (Addendum)
Urine culture has been sent.  Primary care doctor will need to follow the results and help interpret as the patient could easily be colonized without an actual infection.

## 2017-08-29 NOTE — ED Notes (Signed)
Pt waiting for ems transport back to nursing home,

## 2017-08-29 NOTE — ED Triage Notes (Addendum)
[facility] who pulled her foley out today, staff sent pt to er to have foley replaced and to have pt checked for uti. Pt has hx of neuromuscular dysfunctional bladder.

## 2017-08-31 LAB — URINE CULTURE

## 2017-11-22 ENCOUNTER — Other Ambulatory Visit: Payer: Self-pay

## 2017-11-22 ENCOUNTER — Encounter (HOSPITAL_COMMUNITY): Payer: Self-pay | Admitting: Emergency Medicine

## 2017-11-22 ENCOUNTER — Inpatient Hospital Stay (HOSPITAL_COMMUNITY)
Admission: EM | Admit: 2017-11-22 | Discharge: 2017-11-24 | DRG: 872 | Disposition: A | Discharge status: Skilled Nursing Facility | Payer: Medicare Other | Attending: Internal Medicine | Admitting: Internal Medicine

## 2017-11-22 DIAGNOSIS — F329 Major depressive disorder, single episode, unspecified: Secondary | ICD-10-CM | POA: Diagnosis present

## 2017-11-22 DIAGNOSIS — R400 Somnolence: Secondary | ICD-10-CM | POA: Diagnosis present

## 2017-11-22 DIAGNOSIS — I5032 Chronic diastolic (congestive) heart failure: Secondary | ICD-10-CM | POA: Diagnosis present

## 2017-11-22 DIAGNOSIS — Z79899 Other long term (current) drug therapy: Secondary | ICD-10-CM

## 2017-11-22 DIAGNOSIS — J449 Chronic obstructive pulmonary disease, unspecified: Secondary | ICD-10-CM | POA: Diagnosis present

## 2017-11-22 DIAGNOSIS — E785 Hyperlipidemia, unspecified: Secondary | ICD-10-CM | POA: Diagnosis present

## 2017-11-22 DIAGNOSIS — Z9842 Cataract extraction status, left eye: Secondary | ICD-10-CM

## 2017-11-22 DIAGNOSIS — H9190 Unspecified hearing loss, unspecified ear: Secondary | ICD-10-CM | POA: Diagnosis present

## 2017-11-22 DIAGNOSIS — Z79890 Hormone replacement therapy (postmenopausal): Secondary | ICD-10-CM

## 2017-11-22 DIAGNOSIS — R319 Hematuria, unspecified: Secondary | ICD-10-CM

## 2017-11-22 DIAGNOSIS — Z87891 Personal history of nicotine dependence: Secondary | ICD-10-CM

## 2017-11-22 DIAGNOSIS — E039 Hypothyroidism, unspecified: Secondary | ICD-10-CM | POA: Diagnosis present

## 2017-11-22 DIAGNOSIS — H547 Unspecified visual loss: Secondary | ICD-10-CM | POA: Diagnosis present

## 2017-11-22 DIAGNOSIS — Z8673 Personal history of transient ischemic attack (TIA), and cerebral infarction without residual deficits: Secondary | ICD-10-CM

## 2017-11-22 DIAGNOSIS — N390 Urinary tract infection, site not specified: Secondary | ICD-10-CM | POA: Diagnosis present

## 2017-11-22 DIAGNOSIS — Z961 Presence of intraocular lens: Secondary | ICD-10-CM | POA: Diagnosis present

## 2017-11-22 DIAGNOSIS — A4159 Other Gram-negative sepsis: Principal | ICD-10-CM | POA: Diagnosis present

## 2017-11-22 DIAGNOSIS — Z66 Do not resuscitate: Secondary | ICD-10-CM | POA: Diagnosis present

## 2017-11-22 DIAGNOSIS — A419 Sepsis, unspecified organism: Secondary | ICD-10-CM

## 2017-11-22 DIAGNOSIS — F0390 Unspecified dementia without behavioral disturbance: Secondary | ICD-10-CM | POA: Diagnosis present

## 2017-11-22 DIAGNOSIS — N179 Acute kidney failure, unspecified: Secondary | ICD-10-CM

## 2017-11-22 DIAGNOSIS — M1990 Unspecified osteoarthritis, unspecified site: Secondary | ICD-10-CM | POA: Diagnosis present

## 2017-11-22 LAB — CBC
HCT: 46.3 % — ABNORMAL HIGH (ref 36.0–46.0)
HEMOGLOBIN: 15 g/dL (ref 12.0–15.0)
MCH: 29 pg (ref 26.0–34.0)
MCHC: 32.4 g/dL (ref 30.0–36.0)
MCV: 89.6 fL (ref 78.0–100.0)
Platelets: 298 10*3/uL (ref 150–400)
RBC: 5.17 MIL/uL — AB (ref 3.87–5.11)
RDW: 14.3 % (ref 11.5–15.5)
WBC: 12.5 10*3/uL — AB (ref 4.0–10.5)

## 2017-11-22 LAB — I-STAT CG4 LACTIC ACID, ED: Lactic Acid, Venous: 2.49 mmol/L (ref 0.5–1.9)

## 2017-11-22 LAB — CBG MONITORING, ED: Glucose-Capillary: 164 mg/dL — ABNORMAL HIGH (ref 70–99)

## 2017-11-22 MED ORDER — SODIUM CHLORIDE 0.9 % IV BOLUS
1000.0000 mL | Freq: Once | INTRAVENOUS | Status: AC
Start: 2017-11-22 — End: 2017-11-23
  Administered 2017-11-22: 1000 mL via INTRAVENOUS

## 2017-11-22 MED ORDER — CEFEPIME HCL 2 G IJ SOLR
2.0000 g | Freq: Once | INTRAMUSCULAR | Status: AC
  Administered 2017-11-22: 2 g via INTRAVENOUS
  Filled 2017-11-22: qty 2

## 2017-11-22 MED ORDER — SODIUM CHLORIDE 0.9 % IV BOLUS
1000.0000 mL | Freq: Once | INTRAVENOUS | Status: AC
  Administered 2017-11-22: 1000 mL via INTRAVENOUS

## 2017-11-22 NOTE — ED Triage Notes (Signed)
Pt brought in by [facility] from [facility] after vomiting twice today and having AMS. [facility] EMS[facility] response is talking and screaming. Pt currently sleeping but arousable to questions. Per NH, pt currently being treated for UTI with ABX.

## 2017-11-22 NOTE — ED Notes (Signed)
Date and time results received: 11/22/17 23:54  Test: lactic acid 2.49 Critical Value: lactic acid 2.49  Name of Provider Notified: Dr Lynelle DoctorKnapp  Orders Received? Or Actions Taken?:no additional orders given,

## 2017-11-23 DIAGNOSIS — R400 Somnolence: Secondary | ICD-10-CM

## 2017-11-23 DIAGNOSIS — N390 Urinary tract infection, site not specified: Secondary | ICD-10-CM | POA: Diagnosis present

## 2017-11-23 DIAGNOSIS — A419 Sepsis, unspecified organism: Secondary | ICD-10-CM | POA: Diagnosis not present

## 2017-11-23 DIAGNOSIS — J449 Chronic obstructive pulmonary disease, unspecified: Secondary | ICD-10-CM | POA: Diagnosis present

## 2017-11-23 DIAGNOSIS — Z79899 Other long term (current) drug therapy: Secondary | ICD-10-CM | POA: Diagnosis not present

## 2017-11-23 DIAGNOSIS — E039 Hypothyroidism, unspecified: Secondary | ICD-10-CM | POA: Diagnosis present

## 2017-11-23 DIAGNOSIS — A4159 Other Gram-negative sepsis: Secondary | ICD-10-CM | POA: Diagnosis present

## 2017-11-23 DIAGNOSIS — H9190 Unspecified hearing loss, unspecified ear: Secondary | ICD-10-CM | POA: Diagnosis present

## 2017-11-23 DIAGNOSIS — E785 Hyperlipidemia, unspecified: Secondary | ICD-10-CM | POA: Diagnosis present

## 2017-11-23 DIAGNOSIS — Z8673 Personal history of transient ischemic attack (TIA), and cerebral infarction without residual deficits: Secondary | ICD-10-CM | POA: Diagnosis not present

## 2017-11-23 DIAGNOSIS — H547 Unspecified visual loss: Secondary | ICD-10-CM | POA: Diagnosis present

## 2017-11-23 DIAGNOSIS — F0390 Unspecified dementia without behavioral disturbance: Secondary | ICD-10-CM

## 2017-11-23 DIAGNOSIS — N179 Acute kidney failure, unspecified: Secondary | ICD-10-CM | POA: Diagnosis present

## 2017-11-23 DIAGNOSIS — Z79890 Hormone replacement therapy (postmenopausal): Secondary | ICD-10-CM | POA: Diagnosis not present

## 2017-11-23 DIAGNOSIS — M1990 Unspecified osteoarthritis, unspecified site: Secondary | ICD-10-CM | POA: Diagnosis present

## 2017-11-23 DIAGNOSIS — R319 Hematuria, unspecified: Secondary | ICD-10-CM | POA: Diagnosis not present

## 2017-11-23 DIAGNOSIS — Z961 Presence of intraocular lens: Secondary | ICD-10-CM | POA: Diagnosis present

## 2017-11-23 DIAGNOSIS — Z66 Do not resuscitate: Secondary | ICD-10-CM | POA: Diagnosis present

## 2017-11-23 DIAGNOSIS — Z9842 Cataract extraction status, left eye: Secondary | ICD-10-CM | POA: Diagnosis not present

## 2017-11-23 DIAGNOSIS — Z87891 Personal history of nicotine dependence: Secondary | ICD-10-CM | POA: Diagnosis not present

## 2017-11-23 DIAGNOSIS — I5032 Chronic diastolic (congestive) heart failure: Secondary | ICD-10-CM | POA: Diagnosis present

## 2017-11-23 DIAGNOSIS — F329 Major depressive disorder, single episode, unspecified: Secondary | ICD-10-CM | POA: Diagnosis present

## 2017-11-23 LAB — COMPREHENSIVE METABOLIC PANEL
ALBUMIN: 2.8 g/dL — AB (ref 3.5–5.0)
ALT: 14 U/L (ref 0–44)
ALT: 14 U/L (ref 0–44)
ANION GAP: 9 (ref 5–15)
AST: 23 U/L (ref 15–41)
AST: 29 U/L (ref 15–41)
Albumin: 3.6 g/dL (ref 3.5–5.0)
Alkaline Phosphatase: 53 U/L (ref 38–126)
Alkaline Phosphatase: 64 U/L (ref 38–126)
Anion gap: 13 (ref 5–15)
BUN: 30 mg/dL — ABNORMAL HIGH (ref 8–23)
BUN: 35 mg/dL — ABNORMAL HIGH (ref 8–23)
CALCIUM: 8.3 mg/dL — AB (ref 8.9–10.3)
CHLORIDE: 104 mmol/L (ref 98–111)
CO2: 21 mmol/L — ABNORMAL LOW (ref 22–32)
CO2: 22 mmol/L (ref 22–32)
CREATININE: 1.93 mg/dL — AB (ref 0.44–1.00)
Calcium: 9.2 mg/dL (ref 8.9–10.3)
Chloride: 117 mmol/L — ABNORMAL HIGH (ref 98–111)
Creatinine, Ser: 1.32 mg/dL — ABNORMAL HIGH (ref 0.44–1.00)
GFR calc Af Amer: 47 mL/min — ABNORMAL LOW (ref >60)
GFR calc non Af Amer: 26 mL/min — ABNORMAL LOW (ref >60)
GFR calc non Af Amer: 40 mL/min — ABNORMAL LOW (ref >60)
GFR, EST AFRICAN AMERICAN: 30 mL/min — AB (ref >60)
Glucose, Bld: 134 mg/dL — ABNORMAL HIGH (ref 70–99)
Glucose, Bld: 194 mg/dL — ABNORMAL HIGH (ref 70–99)
Potassium: 3.7 mmol/L (ref 3.5–5.1)
Potassium: 4.3 mmol/L (ref 3.5–5.1)
SODIUM: 147 mmol/L — AB (ref 135–145)
Sodium: 139 mmol/L (ref 135–145)
TOTAL PROTEIN: 5.8 g/dL — AB (ref 6.5–8.1)
Total Bilirubin: 0.2 mg/dL — ABNORMAL LOW (ref 0.3–1.2)
Total Bilirubin: 0.6 mg/dL (ref 0.3–1.2)
Total Protein: 7.1 g/dL (ref 6.5–8.1)

## 2017-11-23 LAB — URINALYSIS, ROUTINE W REFLEX MICROSCOPIC
Bilirubin Urine: NEGATIVE
Glucose, UA: NEGATIVE mg/dL
KETONES UR: NEGATIVE mg/dL
NITRITE: NEGATIVE
PROTEIN: 100 mg/dL — AB
RBC / HPF: >50 RBC/hpf — ABNORMAL HIGH (ref 0–5)
Specific Gravity, Urine: 1.01 (ref 1.005–1.030)
pH: 8 (ref 5.0–8.0)

## 2017-11-23 LAB — CBC
HCT: 41.6 % (ref 36.0–46.0)
Hemoglobin: 12.9 g/dL (ref 12.0–15.0)
MCH: 28.2 pg (ref 26.0–34.0)
MCHC: 31 g/dL (ref 30.0–36.0)
MCV: 90.8 fL (ref 78.0–100.0)
PLATELETS: 261 10*3/uL (ref 150–400)
RBC: 4.58 MIL/uL (ref 3.87–5.11)
RDW: 14.5 % (ref 11.5–15.5)
WBC: 13.3 10*3/uL — ABNORMAL HIGH (ref 4.0–10.5)

## 2017-11-23 LAB — I-STAT CG4 LACTIC ACID, ED: Lactic Acid, Venous: 2.23 mmol/L (ref 0.5–1.9)

## 2017-11-23 LAB — MRSA PCR SCREENING: MRSA by PCR: NEGATIVE

## 2017-11-23 MED ORDER — SODIUM CHLORIDE 0.9 % IV SOLN
1.0000 g | INTRAVENOUS | Status: DC
  Administered 2017-11-23: 1 g via INTRAVENOUS
  Filled 2017-11-23: qty 1
  Filled 2017-11-23: qty 10

## 2017-11-23 MED ORDER — ARIPIPRAZOLE 2 MG PO TABS
2.0000 mg | ORAL_TABLET | Freq: Every day | ORAL | Status: DC
  Administered 2017-11-23: 2 mg via ORAL
  Filled 2017-11-23 (×2): qty 1

## 2017-11-23 MED ORDER — ENOXAPARIN SODIUM 40 MG/0.4ML ~~LOC~~ SOLN
40.0000 mg | SUBCUTANEOUS | Status: DC
  Administered 2017-11-23 – 2017-11-24 (×2): 40 mg via SUBCUTANEOUS
  Filled 2017-11-23 (×2): qty 0.4

## 2017-11-23 MED ORDER — ACETAMINOPHEN 325 MG PO TABS: 650.0000 mg | ORAL_TABLET | Freq: Four times a day (QID) | ORAL | Status: DC | PRN

## 2017-11-23 MED ORDER — ACETAMINOPHEN 650 MG RE SUPP: 650.0000 mg | Freq: Four times a day (QID) | RECTAL | Status: DC | PRN

## 2017-11-23 MED ORDER — ONDANSETRON HCL 4 MG/2ML IJ SOLN: 4.0000 mg | Freq: Four times a day (QID) | INTRAMUSCULAR | Status: DC | PRN

## 2017-11-23 MED ORDER — ONDANSETRON HCL 4 MG PO TABS: 4.0000 mg | ORAL_TABLET | Freq: Four times a day (QID) | ORAL | Status: DC | PRN

## 2017-11-23 MED ORDER — SERTRALINE HCL 50 MG PO TABS
75.0000 mg | ORAL_TABLET | Freq: Every day | ORAL | Status: DC
  Administered 2017-11-23: 75 mg via ORAL
  Filled 2017-11-23: qty 2

## 2017-11-23 MED ORDER — SIMVASTATIN 20 MG PO TABS
20.0000 mg | ORAL_TABLET | Freq: Every day | ORAL | Status: DC
  Administered 2017-11-23: 20 mg via ORAL
  Filled 2017-11-23: qty 1

## 2017-11-23 MED ORDER — LEVOTHYROXINE SODIUM 25 MCG PO TABS
25.0000 ug | ORAL_TABLET | Freq: Every day | ORAL | Status: DC
  Administered 2017-11-23 – 2017-11-24 (×2): 25 ug via ORAL
  Filled 2017-11-23 (×2): qty 1

## 2017-11-23 MED ORDER — DONEPEZIL HCL 5 MG PO TABS
5.0000 mg | ORAL_TABLET | Freq: Every day | ORAL | Status: DC
  Administered 2017-11-23: 5 mg via ORAL
  Filled 2017-11-23: qty 1

## 2017-11-23 MED ORDER — MEMANTINE HCL 10 MG PO TABS
10.0000 mg | ORAL_TABLET | Freq: Two times a day (BID) | ORAL | Status: DC
  Administered 2017-11-23 – 2017-11-24 (×3): 10 mg via ORAL
  Filled 2017-11-23 (×3): qty 1

## 2017-11-23 MED ORDER — SODIUM CHLORIDE 0.9 % IV SOLN: 1.0000 g | INTRAVENOUS | Status: DC

## 2017-11-23 MED ORDER — SODIUM CHLORIDE 0.9 % IV SOLN
INTRAVENOUS | Status: DC
  Administered 2017-11-23: 04:00:00 via INTRAVENOUS

## 2017-11-23 NOTE — ED Notes (Signed)
CRITICAL VALUE ALERT  Critical Value:  Lactic 2.23  Date & Time Notied:  11/23/17 0140  Provider Notified: EDP Knapp   Orders Received/Actions taken: No orders at this time

## 2017-11-23 NOTE — Progress Notes (Signed)
Patient seen and examined, database reviewed.  No family members at bedside.  Patient was admitted earlier today from her skilled nursing facility due to abdominal pain.  She has a history of dementia, hypothyroidism prior history of CVA among other issues who had abdominal pain and emesis at her assisted living facility yesterday.  She has a chronic indwelling Foley catheter that was supposedly replaced the day prior to admission.  She was found to have a dirty urine and presumed to have sepsis due to UTI.  Was initially placed on cefepime, prior culture data has not shown resistant organisms.  Believe it is safe to transition over to Rocephin.  Sepsis parameters have resolved at this point.  Believe patient is okay to transfer to floor.  Should possibly be able to return to assisted living facility over the next 24 to 48 hours.  We will continue to follow.  Peggye PittEstela Hernandez, MD [facility] Pager: [phone]

## 2017-11-23 NOTE — Progress Notes (Signed)
Pharmacy Antibiotic Note  Rae MarSara L Genther is a 67 y.o. female admitted on 11/22/2017 with UTI.  Pharmacy has been consulted for cefepime dosing.  Plan: Cefepime 2gm IV given in ED, then 1gm IV q24h F/u cxs and clinical progress Monitor V/S, labs  Height: 5\' 3"  (160 cm) Weight: 117 lb 4.6 oz (53.2 kg) IBW/kg (Calculated) : 52.4  Temp (24hrs), Avg:99.1 F (37.3 C), Min:97.6 F (36.4 C), Max:100.5 F (38.1 C)  Recent Labs  Lab 11/22/17 2337 11/22/17 2342 11/23/17 0138 11/23/17 0556  WBC 12.5*  --   --  13.3*  CREATININE 1.93*  --   --  1.32*  LATICACIDVEN  --  2.49* 2.23*  --     Estimated Creatinine Clearance: 33.7 mL/min (A) (by C-G formula based on SCr of 1.32 mg/dL (H)).    No Known Allergies  Antimicrobials this admission: Cefepime 6/30 >>   Dose adjustments this admission: n/a  Microbiology results: 6/30 BCx: pending 6/30 UCx: pending  Thank you for allowing pharmacy to be a part of this patient's care.  Elder CyphersLorie Lachelle Rissler, BS Pharm D, [place] Clinical Pharmacist Pager [phone] 11/23/2017 8:49 AM

## 2017-11-23 NOTE — ED Notes (Signed)
Pt's urine has foul odor, is dark brown/red in color and has small sedimentation.

## 2017-11-23 NOTE — ED Notes (Signed)
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

## 2017-11-23 NOTE — H&P (Addendum)
[facility] H&P    Patient Demographics:    Kimberly [name], is a 67 y.o. female  MRN: [id number]  DOB - [birth year]  Admit Date - 11/22/2017  Referring MD/NP/PA: Dr. Lynelle Doctor  Outpatient Primary MD for the patient is Pearson Grippe, MD  Patient coming from: Assisted living facility  Chief complaint-abdominal pain   HPI:    Kimberly [name]  is a 67 y.o. female, history of dementia, CVA, congestive heart failure, depression, hypothyroidism who is currently residing at assisted living facility, patient is mostly bedbound, total assist, was brought to the ED complaining of abdominal pain and had episodes of vomiting at the assisted living facility.  Patient has a chronic indwelling Foley catheter which was replaced yesterday.  She was noted to have dark-colored urine.  No other symptoms have been reported.  No chest pain or shortness of breath. Patient is a poor historian due to hearing loss, patient is totally blind She denies pain at this time. In the ED, urine was found to be abnormal and patient started on cefepime. She was treated recently for UTI by PCP at assisted living facility.    Review of systems:      All other systems reviewed and are negative.   With Past History of the following :    Past Medical History:  Diagnosis Date  . Abnormality of gait 08/02/2013  . Anemia   . Arthritis    osteoarthritis  . Blind   . CHF (congestive heart failure) (HCC)   . COPD (chronic obstructive pulmonary disease) (HCC)   . DDD (degenerative disc disease)   . Depression   . HOH (hard of hearing)   . Hypothyroidism   . Memory disorder 08/02/2013  . Pneumonia   . Sepsis (HCC)   . Shortness of breath   . Stroke (HCC)   . Tobacco abuse 11/02/2013      Past Surgical History:  Procedure Laterality Date  . ARM DEBRIDEMENT     as child  . CATARACT EXTRACTION W/PHACO  04/28/2011   Procedure: CATARACT EXTRACTION PHACO AND  INTRAOCULAR LENS PLACEMENT (IOC);  Surgeon: Susa Simmonds;  Location: [facility];  Service: Ophthalmology;  Laterality: Left;  CDE: 5.37  . CESAREAN SECTION    . 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 STRABISMUS SURGERY     age 32  . TEE WITHOUT CARDIOVERSION N/A 01/02/2017   Procedure: TRANSESOPHAGEAL ECHOCARDIOGRAM (TEE);  Surgeon: Antoine Poche, MD;  Location: [facility];  Service: Endoscopy;  Laterality: N/A;      Social History:      Social History   Tobacco Use  . Smoking status: Former Smoker    Packs/day: 0.50    Types: Cigarettes  . Smokeless tobacco: Never Used  Substance Use Topics  . Alcohol use: No       Family History :     Family History  Problem Relation Age of Onset  . Diabetes Brother   . Diabetes Brother   .  Diabetes Mother   . Cancer - Lung Father   . Colon cancer Unknown        neg hx?      Home Medications:   Prior to Admission medications   Medication Sig Start Date End Date Taking? Authorizing Provider  acetaminophen (TYLENOL) 325 MG tablet Take 325 mg by mouth daily.     [provider]  acetaminophen (TYLENOL) 650 MG CR tablet Take 650 mg by mouth 2 (two) times daily.    [provider]  ARIPiprazole (ABILIFY) 2 MG tablet Take 2 mg by mouth at bedtime.    [provider]  cholecalciferol (VITAMIN D) 400 UNITS TABS Take 800 Units by mouth daily.      [provider]  donepezil (ARICEPT) 5 MG tablet TAKE 1 TABLET BY MOUTH AT BEDTIME. 01/15/15   York SpanielWillis, Charles K, MD  levothyroxine (SYNTHROID, LEVOTHROID) 25 MCG tablet Take 25 mcg by mouth daily.      [provider]  LORazepam (ATIVAN) 0.5 MG tablet Take 0.5 mg by mouth 2 (two) times daily.     [provider]  memantine (NAMENDA) 10 MG tablet Take 10 mg by mouth 2 (two) times daily.    [provider]  mirtazapine (REMERON) 15 MG  tablet Take 7.5 mg by mouth at bedtime.  08/09/16   [provider]  Multiple Vitamin (DAILY-VITE PO) Take 1 tablet by mouth daily.    [provider]  Multiple Vitamins-Minerals (ICAPS AREDS FORMULA PO) Take 1 tablet by mouth daily.    [provider]  sertraline (ZOLOFT) 50 MG tablet Take 75 mg by mouth at bedtime.     [provider]  simvastatin (ZOCOR) 40 MG tablet Take 20 mg by mouth daily at 6 PM.  08/10/16   [provider]     Allergies:    No Known Allergies   Physical Exam:   Vitals  Blood pressure 122/75, pulse 91, temperature (!) 100.5 F (38.1 C), temperature source Rectal, resp. rate (!) 30, SpO2 92 %.  1.  General: Appears lethargic  2. Psychiatric: Not tested  3. Neurologic: Not tested, patient is lethargic  4. Eyes :  anicteric sclerae, moist conjunctivae with no lid lag. PERRLA.  5. ENMT:  Oropharynx clear with moist mucous membranes and good dentition  6. Neck:  supple, no cervical lymphadenopathy appriciated, No thyromegaly  7. Respiratory : Normal respiratory effort, good air movement bilaterally,clear to  auscultation bilaterally  8. Cardiovascular : RRR, no gallops, rubs or murmurs, no leg edema  9. Gastrointestinal:  Positive bowel sounds, abdomen soft, non-tender to palpation,no hepatosplenomegaly, no rigidity or guarding       10. Skin:  No cyanosis, normal texture and turgor, no rash, lesions or ulcers      Data Review:    CBC Recent Labs  Lab 11/22/17 2337  WBC 12.5*  HGB 15.0  HCT 46.3*  PLT 298  MCV 89.6  MCH 29.0  MCHC 32.4  RDW 14.3   ------------------------------------------------------------------------------------------------------------------  Chemistries  Recent Labs  Lab 11/22/17 2337  NA 139  K 4.3  CL 104  CO2 22  GLUCOSE 194*  BUN 35*  CREATININE 1.93*  CALCIUM 9.2  AST 29  ALT 14  ALKPHOS 64  BILITOT 0.6    ------------------------------------------------------------------------------------------------------------------  ------------------------------------------------------------------------------------------------------------------ GFR: CrCl cannot be calculated (Unknown ideal weight.). Liver Function Tests: Recent Labs  Lab 11/22/17 2337  AST 29  ALT 14  ALKPHOS 64  BILITOT 0.6  PROT  7.1  ALBUMIN 3.6    Recent Labs  Lab 11/22/17 2258  GLUCAP 164*    --------------------------------------------------------------------------------------------------------------- Urine analysis:    Component Value Date/Time   COLORURINE AMBER (A) 11/22/2017 2317   APPEARANCEUR CLOUDY (A) 11/22/2017 2317   LABSPEC 1.010 11/22/2017 2317   PHURINE 8.0 11/22/2017 2317   GLUCOSEU NEGATIVE 11/22/2017 2317   HGBUR LARGE (A) 11/22/2017 2317   BILIRUBINUR NEGATIVE 11/22/2017 2317   KETONESUR NEGATIVE 11/22/2017 2317   PROTEINUR 100 (A) 11/22/2017 2317   UROBILINOGEN 0.2 11/02/2013 1320   NITRITE NEGATIVE 11/22/2017 2317   LEUKOCYTESUR LARGE (A) 11/22/2017 2317      Imaging Results:      Assessment & Plan:    Active Problems:   Chronic diastolic heart failure (HCC)   History of CVA (cerebrovascular accident)   Dementia without behavioral disturbance   Sepsis (HCC)   1. Sepsis due to UTI-patient presenting with sepsis due to UTI, lactic acid 2.23.  Continue with cefepime per pharmacy consultation.  Follow urine and blood culture results.  2. Hematuria-patient found to have significant hematuria with cola colored urine in the Foley bed.  Likely from UTI.  If does not improve with antibiotics consider urology consultation. 3. Dementia-no behavioral disturbance, stable.  Continue Namenda, Aricept, Abilify. Hold lorazepam as patient is very lethargic. 4. Hyperlipidemia-  continue Zocor     DVT Prophylaxis-   Lovenox   AM Labs Ordered, also please review Full Orders  Family  Communication: Admission, patients condition and plan of care including tests being ordered have been discussed with the patient and her daughter at bedside who indicate understanding and agree with the plan and Code Status.  Code Status: DNR  Admission status: Inpatient  Time spent in minutes : 60 minutes   Meredeth Ide M.D on 11/23/2017 at 2:10 AM  Between 7am to 7pm - Pager - [phone]. After 7pm go to [email or web address] - password [facility]  [facility] - Office  [phone]

## 2017-11-23 NOTE — ED Provider Notes (Signed)
[facility] EMERGENCY DEPARTMENT Provider Note   CSN: [id number] Arrival date & time: 11/22/17  2217  Time seen 23:40 PM   History   Chief Complaint Chief Complaint  Patient presents with  . Altered Mental Status   Level 5 caveat for altered mental status  HPI Rae MarSara L Howley is a 67 y.o. female.  HPI patient presents from her nursing facility via EMS.  They report the facility states she is vomited twice a day and she is not having her normal interaction.  They state normally she talks and screams.  Patient is currently quiet.  When I asked her question she does not answer however she will follow very few simple commands.  He reportedly has been having a UTI recently and is being treated with an antibiotic.  PCP Pearson GrippeKim, James, MD    Past Medical History:  Diagnosis Date  . Abnormality of gait 08/02/2013  . Anemia   . Arthritis    osteoarthritis  . Blind   . CHF (congestive heart failure) (HCC)   . COPD (chronic obstructive pulmonary disease) (HCC)   . DDD (degenerative disc disease)   . Depression   . HOH (hard of hearing)   . Hypothyroidism   . Memory disorder 08/02/2013  . Pneumonia   . Sepsis (HCC)   . Shortness of breath   . Stroke (HCC)   . Tobacco abuse 11/02/2013    Patient Active Problem List   Diagnosis Date Noted  . MRSA bacteremia 12/31/2016  . Sepsis (HCC) 12/30/2016  . Urinary retention 07/18/2016  . Essential hypertension 07/18/2016  . Hyperlipidemia 07/18/2016  . Ischemic stroke (HCC) - L insular embolic infarct s/p tPA, source unknown 07/14/2016  . Acute right hemiparesis (HCC)   . Dysarthria   . Dementia without behavioral disturbance   . Syncope 11/02/2013  . Syncope and collapse 11/02/2013  . Blind 11/02/2013  . Hypothyroidism 11/02/2013  . History of CVA (cerebrovascular accident) 11/02/2013  . Bradycardia 11/02/2013  . Tobacco abuse 11/02/2013  . Memory disorder 08/02/2013  . Abnormality of gait 08/02/2013  . Chronic diastolic heart  failure (HCC) 40/98/119107/26/2013  . ANEMIA 05/04/2009  . LEUKOCYTOSIS UNSPECIFIED 05/04/2009  . 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 ABDOMINAL PAIN, LOWER 05/04/2009    Past Surgical History:  Procedure Laterality Date  . ARM DEBRIDEMENT     as child  . CATARACT EXTRACTION W/PHACO  04/28/2011   Procedure: CATARACT EXTRACTION PHACO AND INTRAOCULAR LENS PLACEMENT (IOC);  Surgeon: Susa Simmondsarroll F Haines;  Location: [facility] ORS;  Service: Ophthalmology;  Laterality: Left;  CDE: 5.37  . CESAREAN SECTION    . 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 STRABISMUS SURGERY     age 89  . TEE WITHOUT CARDIOVERSION N/A 01/02/2017   Procedure: TRANSESOPHAGEAL ECHOCARDIOGRAM (TEE);  Surgeon: Antoine PocheBranch, Jonathan F, MD;  Location: [facility] ENDO SUITE;  Service: Endoscopy;  Laterality: N/A;     OB History    Gravida  2   Para  2   Term  2   Preterm      AB      Living  2     SAB      TAB      Ectopic      Multiple      Live Births               Home Medications    Prior to Admission medications  Medication Sig Start Date End Date Taking? Authorizing Provider  acetaminophen (TYLENOL) 325 MG tablet Take 325 mg by mouth daily.     [provider]  acetaminophen (TYLENOL) 650 MG CR tablet Take 650 mg by mouth 2 (two) times daily.    [provider]  ARIPiprazole (ABILIFY) 2 MG tablet Take 2 mg by mouth at bedtime.    [provider]  cholecalciferol (VITAMIN D) 400 UNITS TABS Take 800 Units by mouth daily.      [provider]  donepezil (ARICEPT) 5 MG tablet TAKE 1 TABLET BY MOUTH AT BEDTIME. 01/15/15   York Spaniel, MD  levothyroxine (SYNTHROID, LEVOTHROID) 25 MCG tablet Take 25 mcg by mouth daily.      [provider]  LORazepam (ATIVAN) 0.5 MG tablet Take 0.5 mg by mouth 2 (two) times daily.     [provider]  memantine (NAMENDA) 10 MG tablet Take 10  mg by mouth 2 (two) times daily.    [provider]  mirtazapine (REMERON) 15 MG tablet Take 7.5 mg by mouth at bedtime.  08/09/16   [provider]  Multiple Vitamin (DAILY-VITE PO) Take 1 tablet by mouth daily.    [provider]  Multiple Vitamins-Minerals (ICAPS AREDS FORMULA PO) Take 1 tablet by mouth daily.    [provider]  sertraline (ZOLOFT) 50 MG tablet Take 75 mg by mouth at bedtime.     [provider]  simvastatin (ZOCOR) 40 MG tablet Take 20 mg by mouth daily at 6 PM.  08/10/16   [provider]    Family History Family History  Problem Relation Age of Onset  . Diabetes Brother   . Diabetes Brother   . Diabetes Mother   . Cancer - Lung Father   . Colon cancer Unknown        neg hx?     Social History Social History   Tobacco Use  . Smoking status: Former Smoker    Packs/day: 0.50    Types: Cigarettes  . Smokeless tobacco: Never Used  Substance Use Topics  . Alcohol use: No  . Drug use: No  lives in a facililty   Allergies   Patient has no known allergies.   Review of Systems Review of Systems  All other systems reviewed and are negative.    Physical Exam Updated Vital Signs BP 122/75   Pulse 91   Temp (!) 100.5 F (38.1 C) (Rectal)   Resp (!) 30   SpO2 92%   Physical Exam  Constitutional:  Non-toxic appearance. She does not appear ill. No distress.  Frail elderly female  HENT:  Head: Normocephalic and atraumatic.  Right Ear: External ear normal.  Left Ear: External ear normal.  Nose: Nose normal. No mucosal edema or rhinorrhea.  Mouth/Throat: Mucous membranes are dry. No dental abscesses or uvula swelling.  Patient's upper dentures are dropped down, however she will open her mouth for me to look in, her tongue appears to be dry  Eyes: Pupils are equal, round, and reactive to light. Conjunctivae and EOM are normal.  Neck: Normal range of motion and full passive range of motion without  pain. Neck supple.  Cardiovascular: Normal rate, regular rhythm and normal heart sounds. Exam reveals no gallop and no friction rub.  No murmur heard. Pulmonary/Chest: Effort normal and breath sounds normal. No respiratory distress. She has no wheezes. She has no rhonchi. She has no rales. She exhibits no tenderness and no crepitus.  Abdominal: Soft. Normal appearance and bowel sounds are normal. She exhibits no distension. There is no tenderness. There is no rebound and no guarding.  Genitourinary:  Genitourinary Comments: Patient's Foley catheter has thick purulent looking drainage.  It is also blood-tinged.  Musculoskeletal: Normal range of motion. She exhibits no edema or tenderness.  Moves all extremities well.   Neurological: She has normal strength.  Patient unable to do  Skin: Skin is warm, dry and intact. No rash noted. No erythema. No pallor.  Psychiatric: She is slowed. She is noncommunicative.  Nursing note and vitals reviewed.    ED Treatments / Results  Labs (all labs ordered are listed, but only abnormal results are displayed) Results for orders placed or performed during the hospital encounter of 11/22/17  Comprehensive metabolic panel  Result Value Ref Range   Sodium 139 135 - 145 mmol/L   Potassium 4.3 3.5 - 5.1 mmol/L   Chloride 104 98 - 111 mmol/L   CO2 22 22 - 32 mmol/L   Glucose, Bld 194 (H) 70 - 99 mg/dL   BUN 35 (H) 8 - 23 mg/dL   Creatinine, Ser 1.61 (H) 0.44 - 1.00 mg/dL   Calcium 9.2 8.9 - 09.6 mg/dL   Total Protein 7.1 6.5 - 8.1 g/dL   Albumin 3.6 3.5 - 5.0 g/dL   AST 29 15 - 41 U/L   ALT 14 0 - 44 U/L   Alkaline Phosphatase 64 38 - 126 U/L   Total Bilirubin 0.6 0.3 - 1.2 mg/dL   GFR calc non Af Amer 26 (L) >60 mL/min   GFR calc Af Amer 30 (L) >60 mL/min   Anion gap 13 5 - 15  CBC  Result Value Ref Range   WBC 12.5 (H) 4.0 - 10.5 K/uL   RBC 5.17 (H) 3.87 - 5.11 MIL/uL   Hemoglobin 15.0 12.0 - 15.0 g/dL   HCT 04.5 (H) 40.9 - 81.1 %   MCV 89.6  78.0 - 100.0 fL   MCH 29.0 26.0 - 34.0 pg   MCHC 32.4 30.0 - 36.0 g/dL   RDW 91.4 78.2 - 95.6 %   Platelets 298 150 - 400 K/uL  Urinalysis, Routine w reflex microscopic  Result Value Ref Range   Color, Urine AMBER (A) YELLOW   APPearance CLOUDY (A) CLEAR   Specific Gravity, Urine 1.010 1.005 - 1.030   pH 8.0 5.0 - 8.0   Glucose, UA NEGATIVE NEGATIVE mg/dL   Hgb urine dipstick LARGE (A) NEGATIVE   Bilirubin Urine NEGATIVE NEGATIVE   Ketones, ur NEGATIVE NEGATIVE mg/dL   Protein, ur 213 (A) NEGATIVE mg/dL   Nitrite NEGATIVE NEGATIVE   Leukocytes, UA LARGE (A) NEGATIVE   RBC / HPF >50 (H) 0 - 5 RBC/hpf   WBC, UA 11-20 0 - 5 WBC/hpf   Bacteria, UA FEW (A) NONE SEEN  CBG monitoring, ED  Result Value Ref Range   Glucose-Capillary 164 (H) 70 - 99 mg/dL  I-Stat CG4 Lactic Acid, ED  Result Value Ref Range   Lactic Acid, Venous 2.49 (HH) 0.5 - 1.9 mmol/L   Comment NOTIFIED PHYSICIAN    Vital signs normal except for elevated lactic acid, probable UTI, leukocytosis, elevated BUN and creatinine over her baseline consistent with dehydration    EKG None  Radiology No results found.  Procedures .Critical Care Performed by: Devoria Albe, MD Authorized by: Devoria Albe, MD   Critical care provider statement:    Critical care time (minutes):  31   Critical  care was necessary to treat or prevent imminent or life-threatening deterioration of the following conditions:  Sepsis   Critical care was time spent personally by me on the following activities:  Discussions with consultants, examination of patient, obtaining history from patient or surrogate, ordering and review of laboratory studies, pulse oximetry and re-evaluation of patient's condition   (including critical care time)  Medications Ordered in ED Medications  sodium chloride 0.9 % bolus 1,000 mL (1,000 mLs Intravenous New Bag/Given 11/22/17 2356)  sodium chloride 0.9 % bolus 1,000 mL (1,000 mLs Intravenous New Bag/Given 11/22/17  2356)  ceFEPIme (MAXIPIME) 2 g in sodium chloride 0.9 % 100 mL IVPB (0 g Intravenous Stopped 11/23/17 0030)     Initial Impression / Assessment and Plan / ED Course  I have reviewed the triage vital signs and the nursing notes.  Pertinent labs & imaging results that were available during my care of the patient were reviewed by me and considered in my medical decision making (see chart for details).     Patient was started on sepsis protocol, she was started on IV Maxipime for her suspected UTI.  She was given IV fluids.  Her lactic acid was noted to be elevated and the IV fluids should help improve that.  12:52 AM Dr Sharl Ma, hospitalist, will admit.   Final Clinical Impressions(s) / ED Diagnoses   Final diagnoses:  Somnolence  Urinary tract infection with hematuria, site unspecified    Plan admission  Devoria Albe, MD, Concha Pyo, MD 11/23/17 [id number]

## 2017-11-24 DIAGNOSIS — A419 Sepsis, unspecified organism: Secondary | ICD-10-CM

## 2017-11-24 DIAGNOSIS — N390 Urinary tract infection, site not specified: Secondary | ICD-10-CM | POA: Diagnosis not present

## 2017-11-24 DIAGNOSIS — N179 Acute kidney failure, unspecified: Secondary | ICD-10-CM

## 2017-11-24 LAB — BASIC METABOLIC PANEL
Anion gap: 8 (ref 5–15)
BUN: 18 mg/dL (ref 8–23)
CO2: 21 mmol/L — ABNORMAL LOW (ref 22–32)
CREATININE: 0.76 mg/dL (ref 0.44–1.00)
Calcium: 8.4 mg/dL — ABNORMAL LOW (ref 8.9–10.3)
Chloride: 118 mmol/L — ABNORMAL HIGH (ref 98–111)
GFR calc Af Amer: >60 mL/min (ref >60)
Glucose, Bld: 105 mg/dL — ABNORMAL HIGH (ref 70–99)
Potassium: 3.8 mmol/L (ref 3.5–5.1)
SODIUM: 147 mmol/L — AB (ref 135–145)

## 2017-11-24 LAB — CBC
HCT: 37 % (ref 36.0–46.0)
Hemoglobin: 11.7 g/dL — ABNORMAL LOW (ref 12.0–15.0)
MCH: 28.8 pg (ref 26.0–34.0)
MCHC: 31.6 g/dL (ref 30.0–36.0)
MCV: 91.1 fL (ref 78.0–100.0)
PLATELETS: 267 10*3/uL (ref 150–400)
RBC: 4.06 MIL/uL (ref 3.87–5.11)
RDW: 14.7 % (ref 11.5–15.5)
WBC: 13.8 10*3/uL — AB (ref 4.0–10.5)

## 2017-11-24 LAB — LACTIC ACID, PLASMA: LACTIC ACID, VENOUS: 1.1 mmol/L (ref 0.5–1.9)

## 2017-11-24 LAB — HIV ANTIBODY (ROUTINE TESTING W REFLEX): HIV SCREEN 4TH GENERATION: NONREACTIVE

## 2017-11-24 MED ORDER — CIPROFLOXACIN HCL 500 MG PO TABS: 500.0000 mg | ORAL_TABLET | Freq: Two times a day (BID) | ORAL | 0 refills | Status: DC

## 2017-11-24 MED ORDER — CIPROFLOXACIN HCL 500 MG PO TABS: 500.0000 mg | ORAL_TABLET | Freq: Two times a day (BID) | ORAL | 0 refills | Status: AC

## 2017-11-24 NOTE — Care Management (Signed)
Returning to ALF today. CM has called and updated daughter, Arline AspCindy, on DC plan.

## 2017-11-24 NOTE — Clinical Social Work Note (Signed)
Pt is a 67 year old female who is medically ready for dc back to her ALF today. Spoke with RN at [facility] to update. They will come pick her up at 2:00. Will fax dc clinical to the ALF. Updated pt's RN. RN CM to call pt's daughter to update on dc plan. There are no other CSW needs for dc.

## 2017-11-24 NOTE — Progress Notes (Signed)
Pt's IV catheter removed and intact. Pt's IV site clean dry and intact. Marcelle Smilingatasha at [facility] aware that patient is being discharged and that transporter has discharge paperwork. No further questions at this time. Pt in stable condition and in no acute distress at time of discharge. Pt escorted by nurse tech and transported to [facility] by facility transporter Leslie AndreaAnnie Hairston.

## 2017-11-24 NOTE — NC FL2 (Signed)
[place] MEDICAID FL2 LEVEL OF CARE SCREENING TOOL     IDENTIFICATION  Patient Name: Kimberly [name] Birthdate: 1950-12-13 Sex: female Admission Date (Current Location): 11/22/2017  [facility] [place]   Facility and Address:  [facility],  [address], [place] [id number]      Provider Number: [id number]  Attending Physician Name and Address:  Philip AspenHernandez Acosta, Minerva EndsEstela*  Relative Name and Phone Number:       Current Level of Care: Hospital Recommended Level of Care: Assisted Living Facility Prior Approval Number:    Date Approved/Denied:   PASRR Number:    Discharge Plan: Other (Comment)(ALF)    Current Diagnoses: Patient Active Problem List   Diagnosis Date Noted  . ARF (acute renal failure) (HCC) 11/24/2017  . MRSA bacteremia 12/31/2016  . Sepsis secondary to UTI (HCC) 12/30/2016  . Urinary retention 07/18/2016  . Essential hypertension 07/18/2016  . Hyperlipidemia 07/18/2016  . Ischemic stroke (HCC) - L insular embolic infarct s/p tPA, source unknown 07/14/2016  . Acute right hemiparesis (HCC)   . Dysarthria   . Dementia without behavioral disturbance   . Syncope 11/02/2013  . Syncope and collapse 11/02/2013  . Blind 11/02/2013  . Hypothyroidism 11/02/2013  . History of CVA (cerebrovascular accident) 11/02/2013  . Bradycardia 11/02/2013  . Tobacco abuse 11/02/2013  . Memory disorder 08/02/2013  . Abnormality of gait 08/02/2013  . Chronic diastolic heart failure (HCC) 12/19/2011  . ANEMIA 05/04/2009  . LEUKOCYTOSIS UNSPECIFIED 05/04/2009  . 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 ABDOMINAL PAIN, LOWER 05/04/2009    Orientation RESPIRATION BLADDER Height & Weight     Self  Normal Continent Weight: 117 lb 4.6 oz (53.2 kg) Height:  5\' 3"  (160 cm)  BEHAVIORAL SYMPTOMS/MOOD NEUROLOGICAL BOWEL NUTRITION STATUS      Continent Diet(cut up meats, no added salt)  AMBULATORY STATUS COMMUNICATION OF NEEDS Skin   Extensive  Assist Verbally Normal                       Personal Care Assistance Level of Assistance  Bathing, Feeding, Dressing Bathing Assistance: Limited assistance Feeding assistance: Limited assistance Dressing Assistance: Limited assistance     Functional Limitations Info  Sight, Hearing, Speech Sight Info: Adequate Hearing Info: Adequate Speech Info: Adequate    SPECIAL CARE FACTORS FREQUENCY                       Contractures Contractures Info: Not present    Additional Factors Info  Code Status, Allergies, Psychotropic Code Status Info: DNR Allergies Info: No [place] Allergies Psychotropic Info: Abilify, Ativan, Remeron, Zoloft         Current Medications (11/24/2017):  This is the current hospital active medication list Current Facility-Administered Medications  Medication Dose Route Frequency Provider Last Rate Last Dose  . 0.9 %  sodium chloride infusion   Intravenous Continuous Philip AspenHernandez Acosta, Limmie PatriciaEstela Y, MD 75 mL/hr at 11/23/17 [phone] [id number]    . acetaminophen (TYLENOL) tablet 650 mg  650 mg Oral Q6H PRN Philip AspenHernandez Acosta, Limmie PatriciaEstela Y, MD       Or  . acetaminophen (TYLENOL) suppository 650 mg  650 mg Rectal Q6H PRN Philip AspenHernandez Acosta, Limmie PatriciaEstela Y, MD      . ARIPiprazole (ABILIFY) tablet 2 mg  2 mg Oral QHS Philip AspenHernandez Acosta, Limmie PatriciaEstela Y, MD   2 mg at 11/23/17 2128  . cefTRIAXone (ROCEPHIN) 1 g in sodium chloride 0.9 % 100 mL IVPB  1  g Intravenous Q24H Philip Aspen, Limmie Patricia, MD   Stopped at 11/23/17 2158  . donepezil (ARICEPT) tablet 5 mg  5 mg Oral QHS Philip Aspen, Limmie Patricia, MD   5 mg at 11/23/17 2128  . enoxaparin (LOVENOX) injection 40 mg  40 mg Subcutaneous Q24H Philip Aspen, Limmie Patricia, MD   40 mg at 11/24/17 0533  . levothyroxine (SYNTHROID, LEVOTHROID) tablet 25 mcg  25 mcg Oral Daily Philip Aspen, Limmie Patricia, MD   25 mcg at 11/24/17 0534  . memantine (NAMENDA) tablet 10 mg  10 mg Oral BID Philip Aspen, Limmie Patricia, MD   10 mg at 11/23/17 2128  . ondansetron  (ZOFRAN) tablet 4 mg  4 mg Oral Q6H PRN Philip Aspen, Limmie Patricia, MD       Or  . ondansetron [facility]) injection 4 mg  4 mg Intravenous Q6H PRN Philip Aspen, Limmie Patricia, MD      . sertraline (ZOLOFT) tablet 75 mg  75 mg Oral QHS Philip Aspen, Limmie Patricia, MD   75 mg at 11/23/17 2126  . simvastatin (ZOCOR) tablet 20 mg  20 mg Oral q1800 Philip Aspen, Limmie Patricia, MD   20 mg at 11/23/17 1814     Discharge Medications:  TAKE these medications   acetaminophen 650 MG CR tablet Commonly known as:  TYLENOL Take 650 mg by mouth 2 (two) times daily.   ARIPiprazole 2 MG tablet Commonly known as:  ABILIFY Take 2 mg by mouth at bedtime.   atorvastatin 10 MG tablet Commonly known as:  LIPITOR Take 10 mg by mouth daily.   cholecalciferol 400 units Tabs tablet Commonly known as:  VITAMIN D Take 800 Units by mouth daily.   ciprofloxacin 500 MG tablet Commonly known as:  CIPRO Take 1 tablet (500 mg total) by mouth 2 (two) times daily for 5 days.   DAILY-VITE PO Take 1 tablet by mouth daily.   donepezil 5 MG tablet Commonly known as:  ARICEPT TAKE 1 TABLET BY MOUTH AT BEDTIME.   ICAPS AREDS FORMULA PO Take 1 tablet by mouth daily.   levothyroxine 25 MCG tablet Commonly known as:  SYNTHROID, LEVOTHROID Take 25 mcg by mouth daily.   LORazepam 0.5 MG tablet Commonly known as:  ATIVAN Take 0.5 mg by mouth 2 (two) times daily.   memantine 10 MG tablet Commonly known as:  NAMENDA Take 10 mg by mouth 2 (two) times daily.   mirtazapine 15 MG tablet Commonly known as:  REMERON Take 7.5 mg by mouth at bedtime.   sertraline 50 MG tablet Commonly known as:  ZOLOFT Take 75 mg by mouth at bedtime.         Relevant Imaging Results:  Relevant Lab Results:   Additional Information    Elliot Gault, LCSW

## 2017-11-24 NOTE — Progress Notes (Signed)
Report given to Bree, RN 

## 2017-11-24 NOTE — Discharge Summary (Signed)
Physician Discharge Summary  Kimberly [name] WUJ:[id number] DOB: 05/04/1951 DOA: 11/22/2017  PCP: Pearson GrippeKim, James, MD  Admit date: 11/22/2017 Discharge date: 11/24/2017  Time spent: 45 minutes  Recommendations for Outpatient Follow-up:  -Will be discharged back to SNF today. -Sensitivities from cultures need to be followed to determine if cipro was an adequate abx choice, and abx changed if necessary.   Discharge Diagnoses:  Principal Problem:   Sepsis secondary to UTI [facility](HCC) Active Problems:   ARF (acute renal failure) (HCC)   Chronic diastolic heart failure (HCC)   History of CVA (cerebrovascular accident)   Dementia without behavioral disturbance   Discharge Condition: Stable and improved  Filed Weights   11/23/17 0700 11/23/17 [id number]  Weight: 140 lb (63.5 kg) 117 lb 4.6 oz (53.2 kg)    History of present illness:  As per Dr. Sharl MaLama on 7/1: Kimberly [name]  is a 67 y.o. female, history of dementia, CVA, congestive heart failure, depression, hypothyroidism who is currently residing at assisted living facility, patient is mostly bedbound, total assist, was brought to the ED complaining of abdominal pain and had episodes of vomiting at the assisted living facility.  Patient has a chronic indwelling Foley catheter which was replaced yesterday.  She was noted to have dark-colored urine.  No other symptoms have been reported.  No chest pain or shortness of breath. Patient is a poor historian due to hearing loss, patient is totally blind She denies pain at this time. In the ED, urine was found to be abnormal and patient started on cefepime. She was treated recently for UTI by PCP at assisted living facility.    Hospital Course:   Sepsis due to Proteus UTI -All sepsis parameters have resolved. -Patient is back at baseline level of mentation. -Urine cultures growing greater than 100,000 colonies of Proteus, sensitivities are pending. -Patient does not have a history of resistant  microorganisms, as such we will discharge her on Cipro for 5 days. -Nonetheless sensitivities from her urine culture need to be followed in 2 to 3 days to determine if Cipro was an adequate antibiotic choice for her.  Dementia without behavioral disturbance -At baseline. -Continue Namenda, Aricept, Abilify, lorazepam.  Hyperlipidemia -Continue statin.  Procedures:  None  Consultations:  None  Discharge Instructions  Discharge Instructions    Diet - low sodium heart healthy   Complete by:  As directed    Increase activity slowly   Complete by:  As directed      Allergies as of 11/24/2017   No Known Allergies     Medication List    TAKE these medications   acetaminophen 650 MG CR tablet Commonly known as:  TYLENOL Take 650 mg by mouth 2 (two) times daily.   ARIPiprazole 2 MG tablet Commonly known as:  ABILIFY Take 2 mg by mouth at bedtime.   atorvastatin 10 MG tablet Commonly known as:  LIPITOR Take 10 mg by mouth daily.   cholecalciferol 400 units Tabs tablet Commonly known as:  VITAMIN D Take 800 Units by mouth daily.   ciprofloxacin 500 MG tablet Commonly known as:  CIPRO Take 1 tablet (500 mg total) by mouth 2 (two) times daily for 5 days.   DAILY-VITE PO Take 1 tablet by mouth daily.   donepezil 5 MG tablet Commonly known as:  ARICEPT TAKE 1 TABLET BY MOUTH AT BEDTIME.   ICAPS AREDS FORMULA PO Take 1 tablet by mouth daily.   levothyroxine 25 MCG tablet Commonly known as:  SYNTHROID, LEVOTHROID Take 25 mcg by mouth daily.   LORazepam 0.5 MG tablet Commonly known as:  ATIVAN Take 0.5 mg by mouth 2 (two) times daily.   memantine 10 MG tablet Commonly known as:  NAMENDA Take 10 mg by mouth 2 (two) times daily.   mirtazapine 15 MG tablet Commonly known as:  REMERON Take 7.5 mg by mouth at bedtime.   sertraline 50 MG tablet Commonly known as:  ZOLOFT Take 75 mg by mouth at bedtime.      No Known Allergies    The results of  significant diagnostics from this hospitalization (including imaging, microbiology, ancillary and laboratory) are listed below for reference.    Significant Diagnostic Studies: No results found.  Microbiology: Recent Results (from the past 240 hour(s))  Culture, blood (routine x 2)     Status: None (Preliminary result)   Collection Time: 11/22/17 10:59 PM  Result Value Ref Range Status   Specimen Description BLOOD RIGHT HAND  Final   Special Requests   Final    BOTTLES DRAWN AEROBIC AND ANAEROBIC Blood Culture adequate volume   Culture   Final    NO GROWTH 2 DAYS Performed at [facility], [address], [place], [place] [address]    Report Status PENDING  Incomplete  Urine culture     Status: Abnormal (Preliminary result)   Collection Time: 11/22/17 11:17 PM  Result Value Ref Range Status   Specimen Description   Final    URINE, CATHETERIZED Performed at [facility], [address], [place], [place] [address]    Special Requests   Final    Normal Performed at [facility], [address], [place], [place] [address]    Culture (A)  Final    >=100,000 COLONIES/mL PROTEUS MIRABILIS SUSCEPTIBILITIES TO FOLLOW Performed at [facility], [address], [place], [place] [address]    Report Status PENDING  Incomplete  Culture, blood (routine x 2)     Status: None (Preliminary result)   Collection Time: 11/22/17 11:38 PM  Result Value Ref Range Status   Specimen Description BLOOD LEFT HAND  Final   Special Requests   Final    BOTTLES DRAWN AEROBIC AND ANAEROBIC Blood Culture adequate volume   Culture   Final    NO GROWTH 2 DAYS Performed at [facility], [address], [place], [place] [address]    Report Status PENDING  Incomplete  MRSA PCR Screening     Status: None   Collection Time: 11/23/17  8:20 AM  Result Value Ref Range Status   MRSA by PCR NEGATIVE NEGATIVE Final    Comment:        The GeneXpert MRSA Assay (FDA approved for NASAL specimens only), is one  component of a comprehensive MRSA colonization surveillance program. It is not intended to diagnose MRSA infection nor to guide or monitor treatment for MRSA infections. Performed at [facility], [address] [place], [place] [address]      Labs: Basic Metabolic Panel: Recent Labs  Lab 11/22/17 2337 11/23/17 0556 11/24/17 0836  NA 139 147* 147*  K 4.3 3.7 3.8  CL 104 117* 118*  CO2 22 21* 21*  GLUCOSE 194* 134* 105*  BUN 35* 30* 18  CREATININE 1.93* 1.32* 0.76  CALCIUM 9.2 8.3* 8.4*   Liver Function Tests: Recent Labs  Lab 11/22/17 2337 11/23/17 0556  AST 29 23  ALT 14 14  ALKPHOS 64 53  BILITOT 0.6 0.2*  PROT 7.1 5.8*  ALBUMIN 3.6 2.8*   No results for input(s): LIPASE, AMYLASE in the last 168 hours. No results for input(s): AMMONIA in the last 168 hours. CBC: Recent Labs  Lab 11/22/17 2337 11/23/17 0556 11/24/17 0836  WBC 12.5* 13.3* 13.8*  HGB 15.0 12.9 11.7*  HCT 46.3* 41.6 37.0  MCV 89.6 90.8 91.1  PLT 298 261 267   Cardiac Enzymes: No results for input(s): CKTOTAL, CKMB, CKMBINDEX, TROPONINI in the last 168 hours. BNP: BNP (last 3 results) No results for input(s): BNP in the last 8760 hours.  ProBNP (last 3 results) No results for input(s): PROBNP in the last 8760 hours.  CBG: Recent Labs  Lab 11/22/17 2258  GLUCAP 164*       Signed:  Chaya Jan  [facility] Pager: [phone] 11/24/2017, 11:54 AM

## 2017-11-25 LAB — URINE CULTURE
Culture: >=100000 — AB
Special Requests: NORMAL

## 2017-11-27 LAB — CULTURE, BLOOD (ROUTINE X 2)
CULTURE: NO GROWTH
Culture: NO GROWTH
SPECIAL REQUESTS: ADEQUATE
Special Requests: ADEQUATE

## 2018-02-10 ENCOUNTER — Emergency Department (HOSPITAL_COMMUNITY)
Admission: EM | Admit: 2018-02-10 | Discharge: 2018-02-10 | Disposition: A | Discharge status: Home / Self Care | Payer: Medicare Other | Attending: Emergency Medicine | Admitting: Emergency Medicine

## 2018-02-10 ENCOUNTER — Other Ambulatory Visit: Payer: Self-pay

## 2018-02-10 ENCOUNTER — Encounter (HOSPITAL_COMMUNITY): Payer: Self-pay

## 2018-02-10 DIAGNOSIS — Z8673 Personal history of transient ischemic attack (TIA), and cerebral infarction without residual deficits: Secondary | ICD-10-CM | POA: Diagnosis not present

## 2018-02-10 DIAGNOSIS — Z79899 Other long term (current) drug therapy: Secondary | ICD-10-CM | POA: Diagnosis not present

## 2018-02-10 DIAGNOSIS — I5032 Chronic diastolic (congestive) heart failure: Secondary | ICD-10-CM | POA: Insufficient documentation

## 2018-02-10 DIAGNOSIS — E039 Hypothyroidism, unspecified: Secondary | ICD-10-CM | POA: Diagnosis not present

## 2018-02-10 DIAGNOSIS — J449 Chronic obstructive pulmonary disease, unspecified: Secondary | ICD-10-CM | POA: Insufficient documentation

## 2018-02-10 DIAGNOSIS — F329 Major depressive disorder, single episode, unspecified: Secondary | ICD-10-CM | POA: Insufficient documentation

## 2018-02-10 DIAGNOSIS — I110 Hypertensive heart disease with heart failure: Secondary | ICD-10-CM | POA: Diagnosis not present

## 2018-02-10 DIAGNOSIS — N3001 Acute cystitis with hematuria: Secondary | ICD-10-CM | POA: Diagnosis not present

## 2018-02-10 DIAGNOSIS — T83098A Other mechanical complication of other indwelling urethral catheter, initial encounter: Secondary | ICD-10-CM | POA: Diagnosis present

## 2018-02-10 DIAGNOSIS — F0390 Unspecified dementia without behavioral disturbance: Secondary | ICD-10-CM | POA: Diagnosis not present

## 2018-02-10 DIAGNOSIS — Y829 Unspecified medical devices associated with adverse incidents: Secondary | ICD-10-CM | POA: Insufficient documentation

## 2018-02-10 DIAGNOSIS — Z87891 Personal history of nicotine dependence: Secondary | ICD-10-CM | POA: Insufficient documentation

## 2018-02-10 LAB — URINALYSIS, ROUTINE W REFLEX MICROSCOPIC
BILIRUBIN URINE: NEGATIVE
Glucose, UA: NEGATIVE mg/dL
KETONES UR: NEGATIVE mg/dL
Nitrite: POSITIVE — AB
PH: 9 — AB (ref 5.0–8.0)
PROTEIN: 100 mg/dL — AB
Specific Gravity, Urine: 1.009 (ref 1.005–1.030)

## 2018-02-10 MED ORDER — FOSFOMYCIN TROMETHAMINE 3 G PO PACK
3.0000 g | PACK | Freq: Once | ORAL | Status: AC
  Administered 2018-02-10: 3 g via ORAL
  Filled 2018-02-10: qty 3

## 2018-02-10 NOTE — ED Provider Notes (Signed)
[facility] EMERGENCY DEPARTMENT Provider Note   CSN: [id number] Arrival date & time: 02/10/18  1013     History   Chief Complaint Chief Complaint  Patient presents with  . Catheter Issue    HPI Rae MarSara L Tacey is a 67 y.o. female.  HPI Patient with dementia presents from a nursing facility with concern of catheter malfunction. Level 5 caveat secondary to dementia, and the patient cannot provide any relevant details of her presentation. Reportedly the patient had a suprapubic catheter malfunction earlier today, but on chart review it is clear that the patient has had a long standing Foley catheter in place. Nursing home report does not include concern for new fever, change in behavior, or other notable findings. Past Medical History:  Diagnosis Date  . Abnormality of gait 08/02/2013  . Anemia   . Arthritis    osteoarthritis  . Blind   . CHF (congestive heart failure) (HCC)   . COPD (chronic obstructive pulmonary disease) (HCC)   . DDD (degenerative disc disease)   . Depression   . HOH (hard of hearing)   . Hypothyroidism   . Memory disorder 08/02/2013  . Pneumonia   . Sepsis (HCC)   . Shortness of breath   . Stroke (HCC)   . Tobacco abuse 11/02/2013    Patient Active Problem List   Diagnosis Date Noted  . ARF (acute renal failure) (HCC) 11/24/2017  . MRSA bacteremia 12/31/2016  . Sepsis secondary to UTI (HCC) 12/30/2016  . Urinary retention 07/18/2016  . Essential hypertension 07/18/2016  . Hyperlipidemia 07/18/2016  . Ischemic stroke (HCC) - L insular embolic infarct s/p tPA, source unknown 07/14/2016  . Acute right hemiparesis (HCC)   . Dysarthria   . Dementia without behavioral disturbance   . Syncope 11/02/2013  . Syncope and collapse 11/02/2013  . Blind 11/02/2013  . Hypothyroidism 11/02/2013  . History of CVA (cerebrovascular accident) 11/02/2013  . Bradycardia 11/02/2013  . Tobacco abuse 11/02/2013  . Memory disorder 08/02/2013  . Abnormality of  gait 08/02/2013  . Chronic diastolic heart failure (HCC) 12/19/2011  . ANEMIA 05/04/2009  . LEUKOCYTOSIS UNSPECIFIED 05/04/2009  . 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 ABDOMINAL PAIN, LOWER 05/04/2009    Past Surgical History:  Procedure Laterality Date  . ARM DEBRIDEMENT     as child  . CATARACT EXTRACTION W/PHACO  04/28/2011   Procedure: CATARACT EXTRACTION PHACO AND INTRAOCULAR LENS PLACEMENT (IOC);  Surgeon: Susa Simmondsarroll F Haines;  Location: [facility] ORS;  Service: Ophthalmology;  Laterality: Left;  CDE: 5.37  . CESAREAN SECTION    . 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 STRABISMUS SURGERY     age 89  . TEE WITHOUT CARDIOVERSION N/A 01/02/2017   Procedure: TRANSESOPHAGEAL ECHOCARDIOGRAM (TEE);  Surgeon: Antoine PocheBranch, Jonathan F, MD;  Location: [facility] ENDO SUITE;  Service: Endoscopy;  Laterality: N/A;     OB History    Gravida  2   Para  2   Term  2   Preterm      AB      Living  2     SAB      TAB      Ectopic      Multiple      Live Births               Home Medications    Prior to Admission medications   Medication Sig Start Date End Date  Taking? Authorizing Provider  acetaminophen (TYLENOL) 650 MG CR tablet Take 650 mg by mouth 2 (two) times daily.    [provider]  ARIPiprazole (ABILIFY) 2 MG tablet Take 2 mg by mouth at bedtime.    [provider]  atorvastatin (LIPITOR) 10 MG tablet Take 10 mg by mouth daily.    [provider]  cholecalciferol (VITAMIN D) 400 UNITS TABS Take 800 Units by mouth daily.      [provider]  donepezil (ARICEPT) 5 MG tablet TAKE 1 TABLET BY MOUTH AT BEDTIME. 01/15/15   York Spaniel, MD  levothyroxine (SYNTHROID, LEVOTHROID) 25 MCG tablet Take 25 mcg by mouth daily.      [provider]  LORazepam (ATIVAN) 0.5 MG tablet Take 0.5 mg by mouth 2 (two) times daily.     [provider]    memantine (NAMENDA) 10 MG tablet Take 10 mg by mouth 2 (two) times daily.    [provider]  mirtazapine (REMERON) 15 MG tablet Take 7.5 mg by mouth at bedtime.  08/09/16   [provider]  Multiple Vitamin (DAILY-VITE PO) Take 1 tablet by mouth daily.    [provider]  Multiple Vitamins-Minerals (ICAPS AREDS FORMULA PO) Take 1 tablet by mouth daily.    [provider]  sertraline (ZOLOFT) 50 MG tablet Take 75 mg by mouth at bedtime.     [provider]    Family History Family History  Problem Relation Age of Onset  . Diabetes Brother   . Diabetes Brother   . Diabetes Mother   . Cancer - Lung Father   . Colon cancer Unknown        neg hx?     Social History Social History   Tobacco Use  . Smoking status: Former Smoker    Packs/day: 0.50    Types: Cigarettes  . Smokeless tobacco: Never Used  Substance Use Topics  . Alcohol use: No  . Drug use: No     Allergies   Patient has no known allergies.   Review of Systems Review of Systems  Unable to perform ROS: Dementia     Physical Exam Updated Vital Signs BP (!) 101/59   Pulse 84   Temp 98.2 F (36.8 C) (Oral)   Resp 12   SpO2 94%   Physical Exam  Constitutional: She has a sickly appearance. No distress.  HENT:  Head: Normocephalic and atraumatic.  Eyes: Conjunctivae and EOM are normal.  Pulmonary/Chest: Effort normal and breath sounds normal. No stridor. No respiratory distress.  Abdominal: She exhibits no distension. There is no tenderness. There is no guarding.  Musculoskeletal: She exhibits no edema.  Neurological: She is alert. She displays atrophy. She displays no tremor.  Patient has her eyes open resting in bed, offers nonsensical responses to multiple questions, seemingly denies pain, but does not follow neurologic command reliably.  Skin: Skin is warm and dry.  Psychiatric: She is slowed and withdrawn. Cognition and memory are impaired.  Nursing note  and vitals reviewed.    ED Treatments / Results  Labs (all labs ordered are listed, but only abnormal results are displayed) Labs Reviewed  URINALYSIS, ROUTINE W REFLEX MICROSCOPIC - Abnormal; Notable for the following components:      Result Value   APPearance CLOUDY (*)    pH 9.0 (*)    Hgb urine dipstick SMALL (*)    Protein, ur 100 (*)    Nitrite POSITIVE (*)  Leukocytes, UA LARGE (*)    WBC, UA >50 (*)    Bacteria, UA MANY (*)    All other components within normal limits     Procedures Procedures (including critical care time)  Medications Ordered in ED Medications  fosfomycin (MONUROL) packet 3 g (has no administration in time range)     Initial Impression / Assessment and Plan / ED Course  I have reviewed the triage vital signs and the nursing notes.  Pertinent labs & imaging results that were available during my care of the patient were reviewed by me and considered in my medical decision making (see chart for details).  Chart review after the initial evaluation notable for multiple. Notably, the patient's urinalysis today is more conclusive for urinary tract infection. The patient remains afebrile, in no distress, and per chart review in a similar condition to multiple prior studies, there is low suspicion for bacteremia or sepsis. Patient received antibiotics here, had a placement of her Foley catheter provided by nursing staff, and she was returned to her nursing facility.  Final Clinical Impressions(s) / ED Diagnoses   Final diagnoses:  Acute cystitis with hematuria      Gerhard Munch, MD 02/10/18 1242

## 2018-02-10 NOTE — ED Notes (Signed)
Pt redressed and vitals updated. Pt is comfortable at this time.

## 2018-02-10 NOTE — ED Notes (Signed)
Pt has been discharged. Awaiting pick up

## 2018-02-10 NOTE — ED Notes (Signed)
Called EMS for transport back to [facility]. 

## 2018-02-10 NOTE — ED Notes (Signed)
Have paged pharmacy for patient's antibiotic

## 2018-02-10 NOTE — ED Notes (Signed)
Have notified secretary of calling EMS for transport

## 2018-02-10 NOTE — Discharge Instructions (Addendum)
As discussed, today's evaluation has demonstrated the presence of a urinary tract infection. You have been provided the necessary antibiotics during this emergency department visit. Please be sure to schedule follow-up with your primary care physician. Return here for any concerning changes in your condition.

## 2018-02-10 NOTE — ED Triage Notes (Signed)
Pt is a resident at [facility]. Per staff she pulled out her suprapubic catheter, but are not sure when. Is altered, but has a history of dementia. VSS.

## 2018-02-10 NOTE — ED Notes (Signed)
On examination, pt does not have an insertion point for a suprapubic catheter

## 2018-03-12 ENCOUNTER — Emergency Department (HOSPITAL_COMMUNITY)
Admission: EM | Admit: 2018-03-12 | Discharge: 2018-03-13 | Disposition: A | Discharge status: Home / Self Care | Payer: Medicare Other | Attending: Emergency Medicine | Admitting: Emergency Medicine

## 2018-03-12 ENCOUNTER — Other Ambulatory Visit: Payer: Self-pay

## 2018-03-12 ENCOUNTER — Emergency Department (HOSPITAL_COMMUNITY): Payer: Medicare Other

## 2018-03-12 ENCOUNTER — Encounter (HOSPITAL_COMMUNITY): Payer: Self-pay | Admitting: *Deleted

## 2018-03-12 DIAGNOSIS — Z87891 Personal history of nicotine dependence: Secondary | ICD-10-CM | POA: Insufficient documentation

## 2018-03-12 DIAGNOSIS — J449 Chronic obstructive pulmonary disease, unspecified: Secondary | ICD-10-CM | POA: Insufficient documentation

## 2018-03-12 DIAGNOSIS — E039 Hypothyroidism, unspecified: Secondary | ICD-10-CM | POA: Diagnosis not present

## 2018-03-12 DIAGNOSIS — F0390 Unspecified dementia without behavioral disturbance: Secondary | ICD-10-CM | POA: Insufficient documentation

## 2018-03-12 DIAGNOSIS — I10 Essential (primary) hypertension: Secondary | ICD-10-CM | POA: Insufficient documentation

## 2018-03-12 DIAGNOSIS — N3000 Acute cystitis without hematuria: Secondary | ICD-10-CM | POA: Diagnosis not present

## 2018-03-12 DIAGNOSIS — Z79899 Other long term (current) drug therapy: Secondary | ICD-10-CM | POA: Diagnosis not present

## 2018-03-12 DIAGNOSIS — R82998 Other abnormal findings in urine: Secondary | ICD-10-CM | POA: Diagnosis present

## 2018-03-12 LAB — URINALYSIS, ROUTINE W REFLEX MICROSCOPIC
Bilirubin Urine: NEGATIVE
GLUCOSE, UA: NEGATIVE mg/dL
Ketones, ur: 5 mg/dL — AB
NITRITE: NEGATIVE
PROTEIN: 100 mg/dL — AB
Specific Gravity, Urine: 1.01 (ref 1.005–1.030)
pH: 8 (ref 5.0–8.0)

## 2018-03-12 LAB — RAPID URINE DRUG SCREEN, HOSP PERFORMED
Amphetamines: NOT DETECTED
BARBITURATES: NOT DETECTED
Benzodiazepines: NOT DETECTED
COCAINE: NOT DETECTED
Opiates: NOT DETECTED
TETRAHYDROCANNABINOL: NOT DETECTED

## 2018-03-12 LAB — BLOOD GAS, ARTERIAL
Acid-Base Excess: 1 mmol/L (ref 0.0–2.0)
BICARBONATE: 25.6 mmol/L (ref 20.0–28.0)
Drawn by: 221791
O2 CONTENT: 3 L/min
O2 SAT: 98.9 %
PCO2 ART: 35.3 mmHg (ref 32.0–48.0)
pH, Arterial: 7.456 — ABNORMAL HIGH (ref 7.350–7.450)
pO2, Arterial: 126 mmHg — ABNORMAL HIGH (ref 83.0–108.0)

## 2018-03-12 LAB — CBC WITH DIFFERENTIAL/PLATELET
Abs Immature Granulocytes: 0.05 10*3/uL (ref 0.00–0.07)
BASOS ABS: 0.1 10*3/uL (ref 0.0–0.1)
Basophils Relative: 0 %
EOS PCT: 1 %
Eosinophils Absolute: 0.2 10*3/uL (ref 0.0–0.5)
HCT: 46.1 % — ABNORMAL HIGH (ref 36.0–46.0)
HEMOGLOBIN: 13.8 g/dL (ref 12.0–15.0)
IMMATURE GRANULOCYTES: 0 %
LYMPHS PCT: 9 %
Lymphs Abs: 1.3 10*3/uL (ref 0.7–4.0)
MCH: 26.7 pg (ref 26.0–34.0)
MCHC: 29.9 g/dL — AB (ref 30.0–36.0)
MCV: 89.3 fL (ref 80.0–100.0)
Monocytes Absolute: 1.2 10*3/uL — ABNORMAL HIGH (ref 0.1–1.0)
Monocytes Relative: 8 %
NEUTROS PCT: 82 %
NRBC: 0 % (ref 0.0–0.2)
Neutro Abs: 11.6 10*3/uL — ABNORMAL HIGH (ref 1.7–7.7)
Platelets: 380 10*3/uL (ref 150–400)
RBC: 5.16 MIL/uL — ABNORMAL HIGH (ref 3.87–5.11)
RDW: 14 % (ref 11.5–15.5)
WBC: 14.4 10*3/uL — ABNORMAL HIGH (ref 4.0–10.5)

## 2018-03-12 LAB — COMPREHENSIVE METABOLIC PANEL
ALBUMIN: 3.6 g/dL (ref 3.5–5.0)
ALK PHOS: 82 U/L (ref 38–126)
ALT: 10 U/L (ref 0–44)
AST: 24 U/L (ref 15–41)
Anion gap: 12 (ref 5–15)
BUN: 24 mg/dL — AB (ref 8–23)
CALCIUM: 9.3 mg/dL (ref 8.9–10.3)
CHLORIDE: 101 mmol/L (ref 98–111)
CO2: 25 mmol/L (ref 22–32)
CREATININE: 1.09 mg/dL — AB (ref 0.44–1.00)
GFR calc Af Amer: 59 mL/min — ABNORMAL LOW (ref >60)
GFR calc non Af Amer: 51 mL/min — ABNORMAL LOW (ref >60)
GLUCOSE: 123 mg/dL — AB (ref 70–99)
Potassium: 4.5 mmol/L (ref 3.5–5.1)
SODIUM: 138 mmol/L (ref 135–145)
Total Bilirubin: 1 mg/dL (ref 0.3–1.2)
Total Protein: 7.8 g/dL (ref 6.5–8.1)

## 2018-03-12 LAB — ETHANOL: Alcohol, Ethyl (B): <10 mg/dL (ref <10)

## 2018-03-12 LAB — LACTIC ACID, PLASMA: LACTIC ACID, VENOUS: 1.7 mmol/L (ref 0.5–1.9)

## 2018-03-12 LAB — LIPASE, BLOOD: Lipase: 33 U/L (ref 11–51)

## 2018-03-12 MED ORDER — SODIUM CHLORIDE 0.9 % IV SOLN
INTRAVENOUS | Status: DC | PRN
  Administered 2018-03-12: 1000 mL via INTRAVENOUS

## 2018-03-12 MED ORDER — SODIUM CHLORIDE 0.9 % IV SOLN
1.0000 g | Freq: Once | INTRAVENOUS | Status: AC
  Administered 2018-03-12: 1 g via INTRAVENOUS
  Filled 2018-03-12: qty 10

## 2018-03-12 MED ORDER — SODIUM CHLORIDE 0.9 % IV SOLN: INTRAVENOUS | Status: DC

## 2018-03-12 MED ORDER — CEPHALEXIN 500 MG PO CAPS: 500.0000 mg | ORAL_CAPSULE | Freq: Four times a day (QID) | ORAL | 0 refills | Status: DC

## 2018-03-12 MED ORDER — NALOXONE HCL 0.4 MG/ML IJ SOLN
0.4000 mg | Freq: Once | INTRAMUSCULAR | Status: AC
  Administered 2018-03-12: 0.4 mg via INTRAVENOUS
  Filled 2018-03-12: qty 1

## 2018-03-12 NOTE — Discharge Instructions (Addendum)
Work-up here does seem to be consistent with a urinary tract infection urine sent for culture for confirmation.  Patient received Rocephin here and then has a discharge prescription for Keflex to take for the next 7 days.  Following up of the urine culture would be important.  Also did an evaluation for patient's drowsiness without any specific findings.  Patient daughter who is her power of attorney did arrive and felt that patient was at baseline.  Patient stable for discharge back to assisted living.

## 2018-03-12 NOTE — ED Triage Notes (Signed)
[facility] staff called EMS to report patient has foul odor of urine with "muddy looking " appearance.  Patient has foley catheter in that came out during transfer of patient from stretcher to bed.  EMS does not report fever, vital signs WDL.  Patient has history of dementia with no new reported altered mental status.

## 2018-03-12 NOTE — ED Notes (Signed)
Attempt call 3 times for pick up, no answer, "voice mail full".

## 2018-03-12 NOTE — ED Provider Notes (Signed)
[facility] EMERGENCY DEPARTMENT Provider Note   CSN: [id number] Arrival date & time: 03/12/18  1133     History   Chief Complaint Chief Complaint  Patient presents with  . Urinary Frequency    HPI LASHAUNTA [name] is a 67 y.o. female.  Patient sent in from [place] 4 hours.  Staff called EMS to report patient had foul order with her urine and it was muddy looking.  Nursing here noted the Foley catheter was in place but it came out during transfer.  Nurses noted that it seemed to be old.  There was no history of fever.  Patient has a history of dementia.  And apparently a new altered mental status.     Past Medical History:  Diagnosis Date  . Abnormality of gait 08/02/2013  . Anemia   . Arthritis    osteoarthritis  . Blind   . CHF (congestive heart failure) (HCC)   . COPD (chronic obstructive pulmonary disease) (HCC)   . DDD (degenerative disc disease)   . Depression   . HOH (hard of hearing)   . Hypothyroidism   . Memory disorder 08/02/2013  . Pneumonia   . Sepsis (HCC)   . Shortness of breath   . Stroke (HCC)   . Tobacco abuse 11/02/2013    Patient Active Problem List   Diagnosis Date Noted  . ARF (acute renal failure) (HCC) 11/24/2017  . MRSA bacteremia 12/31/2016  . Sepsis secondary to UTI (HCC) 12/30/2016  . Urinary retention 07/18/2016  . Essential hypertension 07/18/2016  . Hyperlipidemia 07/18/2016  . Ischemic stroke (HCC) - L insular embolic infarct s/p tPA, source unknown 07/14/2016  . Acute right hemiparesis (HCC)   . Dysarthria   . Dementia without behavioral disturbance (HCC)   . Syncope 11/02/2013  . Syncope and collapse 11/02/2013  . Blind 11/02/2013  . Hypothyroidism 11/02/2013  . History of CVA (cerebrovascular accident) 11/02/2013  . Bradycardia 11/02/2013  . Tobacco abuse 11/02/2013  . Memory disorder 08/02/2013  . Abnormality of gait 08/02/2013  . Chronic diastolic heart failure (HCC) 12/19/2011  . ANEMIA 05/04/2009  . LEUKOCYTOSIS  UNSPECIFIED 05/04/2009  . 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 ABDOMINAL PAIN, LOWER 05/04/2009    Past Surgical History:  Procedure Laterality Date  . ARM DEBRIDEMENT     as child  . CATARACT EXTRACTION W/PHACO  04/28/2011   Procedure: CATARACT EXTRACTION PHACO AND INTRAOCULAR LENS PLACEMENT (IOC);  Surgeon: Susa Simmonds;  Location: [facility];  Service: Ophthalmology;  Laterality: Left;  CDE: 5.37  . CESAREAN SECTION    . 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 STRABISMUS SURGERY     age 55  . TEE WITHOUT CARDIOVERSION N/A 01/02/2017   Procedure: TRANSESOPHAGEAL ECHOCARDIOGRAM (TEE);  Surgeon: Antoine Poche, MD;  Location: [facility];  Service: Endoscopy;  Laterality: N/A;     OB History    Gravida  2   Para  2   Term  2   Preterm      AB      Living  2     SAB      TAB      Ectopic      Multiple      Live Births               Home Medications    Prior to Admission medications   Medication Sig Start Date End Date  Taking? Authorizing Provider  acetaminophen (TYLENOL) 650 MG CR tablet Take 650 mg by mouth 2 (two) times daily.   Yes [provider]  ARIPiprazole (ABILIFY) 2 MG tablet Take 2 mg by mouth at bedtime.   Yes [provider]  atorvastatin (LIPITOR) 10 MG tablet Take 10 mg by mouth daily.   Yes [provider]  cholecalciferol (VITAMIN D) 400 UNITS TABS Take 800 Units by mouth daily.     Yes [provider]  donepezil (ARICEPT) 5 MG tablet TAKE 1 TABLET BY MOUTH AT BEDTIME. 01/15/15  Yes York Spaniel, MD  levothyroxine (SYNTHROID, LEVOTHROID) 25 MCG tablet Take 25 mcg by mouth daily.     Yes [provider]  LORazepam (ATIVAN) 0.5 MG tablet Take 0.5 mg by mouth 2 (two) times daily.    Yes [provider]  memantine (NAMENDA) 10 MG tablet Take 10 mg by mouth 2 (two) times daily.   Yes  [provider]  mirtazapine (REMERON) 15 MG tablet Take 7.5 mg by mouth at bedtime.  08/09/16  Yes [provider]  Multiple Vitamin (DAILY-VITE PO) Take 1 tablet by mouth daily.   Yes [provider]  Multiple Vitamins-Minerals (ICAPS AREDS FORMULA PO) Take 1 tablet by mouth daily.   Yes [provider]  sertraline (ZOLOFT) 50 MG tablet Take 75 mg by mouth at bedtime.    Yes [provider]  cephALEXin (KEFLEX) 500 MG capsule Take 1 capsule (500 mg total) by mouth 4 (four) times daily. 03/12/18   Vanetta Mulders, MD    Family History Family History  Problem Relation Age of Onset  . Diabetes Brother   . Diabetes Brother   . Diabetes Mother   . Cancer - Lung Father   . Colon cancer Unknown        neg hx?     Social History Social History   Tobacco Use  . Smoking status: Former Smoker    Packs/day: 0.50    Types: Cigarettes  . Smokeless tobacco: Never Used  Substance Use Topics  . Alcohol use: No  . Drug use: No     Allergies   Patient has no known allergies.   Review of Systems Review of Systems  Unable to perform ROS: Dementia  Constitutional: Negative for fever.  HENT: Positive for hearing loss.   Eyes: Positive for visual disturbance.  Respiratory: Negative for shortness of breath.   Cardiovascular: Negative for chest pain.  Gastrointestinal: Negative for abdominal pain, nausea and vomiting.  Genitourinary: Positive for dysuria.  Musculoskeletal: Negative for myalgias.  Skin: Negative for rash.  Neurological: Negative for headaches.  Hematological: Does not bruise/bleed easily.  Psychiatric/Behavioral: Positive for confusion.     Physical Exam Updated Vital Signs BP 122/67   Pulse 95   Temp 97.6 F (36.4 C) (Oral)   Resp (!) 23   SpO2 96%   Physical Exam  Constitutional: She appears well-developed and well-nourished. No distress.  HENT:  Head: Normocephalic and atraumatic.  Mouth/Throat: Oropharynx is  clear and moist.  Eyes: Pupils are equal, round, and reactive to light. Conjunctivae and EOM are normal.  Neck: Neck supple.  Cardiovascular: Normal rate, regular rhythm and normal heart sounds.  Pulmonary/Chest: Effort normal and breath sounds normal. No respiratory distress.  Abdominal: Bowel sounds are normal. There is no tenderness.  Musculoskeletal: Normal range of motion. She exhibits no edema.  Neurological:  Drowsy, moves all extremities spontaneously  Skin: Skin is warm.  Nursing note and  vitals reviewed.    ED Treatments / Results  Labs (all labs ordered are listed, but only abnormal results are displayed) Labs Reviewed  URINALYSIS, ROUTINE W REFLEX MICROSCOPIC - Abnormal; Notable for the following components:      Result Value   Color, Urine AMBER (*)    APPearance CLOUDY (*)    Hgb urine dipstick SMALL (*)    Ketones, ur 5 (*)    Protein, ur 100 (*)    Leukocytes, UA MODERATE (*)    WBC, UA >50 (*)    Bacteria, UA FEW (*)    All other components within normal limits  CBC WITH DIFFERENTIAL/PLATELET - Abnormal; Notable for the following components:   WBC 14.4 (*)    RBC 5.16 (*)    HCT 46.1 (*)    MCHC 29.9 (*)    Neutro Abs 11.6 (*)    Monocytes Absolute 1.2 (*)    All other components within normal limits  COMPREHENSIVE METABOLIC PANEL - Abnormal; Notable for the following components:   Glucose, Bld 123 (*)    BUN 24 (*)    Creatinine, Ser 1.09 (*)    GFR calc non Af Amer 51 (*)    GFR calc Af Amer 59 (*)    All other components within normal limits  BLOOD GAS, ARTERIAL - Abnormal; Notable for the following components:   pH, Arterial 7.456 (*)    pO2, Arterial 126 (*)    Allens test (pass/fail) NOT INDICATED (*)    All other components within normal limits  URINE CULTURE  RAPID URINE DRUG SCREEN, HOSP PERFORMED  ETHANOL  LIPASE, BLOOD  LACTIC ACID, PLASMA    EKG EKG Interpretation  Date/Time:  Friday March 12 2018 15:43:38 EDT Ventricular  Rate:  100 PR Interval:    QRS Duration: 86 QT Interval:  334 QTC Calculation: 431 R Axis:   30 Text Interpretation:  Sinus tachycardia Low voltage, precordial leads Abnormal R-wave progression, early transition Borderline T abnormalities, anterior leads Artifact in lead(s) I III aVR aVL aVF Interpretation limited secondary to artifact Confirmed by Vanetta Mulders [id number]) on 03/12/2018 3:47:26 PM   Radiology Ct Head Wo Contrast  Result Date: 03/12/2018 CLINICAL DATA:  Dementia with worsening of mental status recently. EXAM: CT HEAD WITHOUT CONTRAST TECHNIQUE: Contiguous axial images were obtained from the base of the skull through the vertex without intravenous contrast. COMPARISON:  07/14/2016 FINDINGS: Brain: Generalized atrophy. Chronic small-vessel ischemic changes of the pons. Old right thalamic infarction. Old left frontal cortical infarction. Old left insular infarction. Chronic small-vessel ischemic change of the cerebral hemispheric white matter. No mass lesion, hemorrhage, hydrocephalus or extra-axial collection. Vascular: There is atherosclerotic calcification of the major vessels at the base of the brain. Skull: Negative Sinuses/Orbits: Clear/normal Other: None IMPRESSION: No acute finding by CT. Atrophy and old infarctions as outlined above. Electronically Signed   By: Paulina Fusi M.D.   On: 03/12/2018 16:51   Dg Chest Port 1 View  Result Date: 03/12/2018 CLINICAL DATA:  67 year old with altered mental status. EXAM: PORTABLE CHEST 1 VIEW COMPARISON:  12/30/2016 FINDINGS: Both lungs are clear. Heart and mediastinum are within normal limits. Trachea is midline. Negative for a pneumothorax. Bone structures are unremarkable. IMPRESSION: No active disease. Electronically Signed   By: Richarda Overlie M.D.   On: 03/12/2018 15:08    Procedures Procedures (including critical care time)  Medications Ordered in ED Medications  0.9 %  sodium chloride infusion (has no administration in time  range)  0.9 %  sodium chloride infusion (1,000 mLs Intravenous New Bag/Given 03/12/18 1540)  naloxone [facility]) injection 0.4 mg (0.4 mg Intravenous Given 03/12/18 1546)  cefTRIAXone (ROCEPHIN) 1 g in sodium chloride 0.9 % 100 mL IVPB (1 g Intravenous New Bag/Given 03/12/18 1542)     Initial Impression / Assessment and Plan / ED Course  I have reviewed the triage vital signs and the nursing notes.  Pertinent labs & imaging results that were available during my care of the patient were reviewed by me and considered in my medical decision making (see chart for details).    Patient with extensive work-up here urine clearly seem to be consistent with urinary tract infection.  Urine sent for culture.  Patient given 1 g of Rocephin.  The rest of the work-up was for the drowsiness of the new report of altered mental status.  Work-up for this including CT head trial of Narcan no changes basically things remain the same patient's daughter arrived and stated that this is kind to her baseline.  She did interact with the daughter.  Daughter is to let us know that patient is very hard of hearing and is essentially blind.  She felt her mental status was baseline she felt that far work-up for the altered mental status was negative that she was more than stable to return back to nursing facility.  Patient's daughter is her power of attorney.  Work-up here also included arterial blood gas.  Patient will be discharged back with Keflex for the urinary tract infection and urine culture is pending.   Final Clinical Impressions(s) / ED Diagnoses   Final diagnoses:  Acute cystitis without hematuria    ED Discharge Orders         Ordered    cephALEXin (KEFLEX) 500 MG capsule  4 times daily     03/12/18 1821           Vanetta Mulders, MD 03/12/18 1851

## 2018-03-12 NOTE — ED Notes (Signed)
[facility] for transport

## 2018-03-15 LAB — URINE CULTURE

## 2018-03-16 ENCOUNTER — Telehealth: Payer: Self-pay | Admitting: Emergency Medicine

## 2018-03-16 NOTE — Telephone Encounter (Signed)
Post ED Visit - Positive Culture Follow-up  Culture report reviewed by antimicrobial stewardship pharmacist:  []  Enzo Bi, Pharm.D. []  Celedonio Miyamoto, Pharm.D., BCPS AQ-ID []  Garvin Fila, Pharm.D., BCPS []  Georgina Pillion, Pharm.D., BCPS []  [place], [address].D., BCPS, AAHIVP []  Estella Husk, Pharm.D., BCPS, AAHIVP [x]  Lysle Pearl, PharmD, BCPS []  Phillips Climes, PharmD, BCPS []  Agapito Games, PharmD, BCPS []  Verlan Friends, PharmD  Positive urine culture Treated with cephalexin, organism sensitive to the same and no further patient follow-up is required at this time.  Berle Mull 03/16/2018, 12:28 PM

## 2018-04-18 ENCOUNTER — Inpatient Hospital Stay (HOSPITAL_COMMUNITY)
Admission: EM | Admit: 2018-04-18 | Discharge: 2018-04-24 | DRG: 871 | Disposition: A | Discharge status: Nursing Home (Includes ICF) | Payer: Medicare Other | Source: Skilled Nursing Facility | Attending: Internal Medicine | Admitting: Internal Medicine

## 2018-04-18 ENCOUNTER — Emergency Department (HOSPITAL_COMMUNITY): Payer: Medicare Other

## 2018-04-18 ENCOUNTER — Encounter (HOSPITAL_COMMUNITY): Payer: Self-pay

## 2018-04-18 DIAGNOSIS — E43 Unspecified severe protein-calorie malnutrition: Secondary | ICD-10-CM | POA: Diagnosis present

## 2018-04-18 DIAGNOSIS — Z9049 Acquired absence of other specified parts of digestive tract: Secondary | ICD-10-CM

## 2018-04-18 DIAGNOSIS — Z961 Presence of intraocular lens: Secondary | ICD-10-CM | POA: Diagnosis present

## 2018-04-18 DIAGNOSIS — F329 Major depressive disorder, single episode, unspecified: Secondary | ICD-10-CM | POA: Diagnosis present

## 2018-04-18 DIAGNOSIS — R197 Diarrhea, unspecified: Secondary | ICD-10-CM

## 2018-04-18 DIAGNOSIS — N1330 Unspecified hydronephrosis: Secondary | ICD-10-CM | POA: Diagnosis present

## 2018-04-18 DIAGNOSIS — N136 Pyonephrosis: Secondary | ICD-10-CM | POA: Diagnosis present

## 2018-04-18 DIAGNOSIS — Y738 Miscellaneous gastroenterology and urology devices associated with adverse incidents, not elsewhere classified: Secondary | ICD-10-CM | POA: Diagnosis present

## 2018-04-18 DIAGNOSIS — Z8701 Personal history of pneumonia (recurrent): Secondary | ICD-10-CM

## 2018-04-18 DIAGNOSIS — F419 Anxiety disorder, unspecified: Secondary | ICD-10-CM | POA: Diagnosis present

## 2018-04-18 DIAGNOSIS — I10 Essential (primary) hypertension: Secondary | ICD-10-CM | POA: Diagnosis present

## 2018-04-18 DIAGNOSIS — N3289 Other specified disorders of bladder: Secondary | ICD-10-CM | POA: Diagnosis present

## 2018-04-18 DIAGNOSIS — Z720 Tobacco use: Secondary | ICD-10-CM | POA: Diagnosis present

## 2018-04-18 DIAGNOSIS — E876 Hypokalemia: Secondary | ICD-10-CM | POA: Diagnosis present

## 2018-04-18 DIAGNOSIS — E872 Acidosis: Secondary | ICD-10-CM | POA: Diagnosis present

## 2018-04-18 DIAGNOSIS — I69351 Hemiplegia and hemiparesis following cerebral infarction affecting right dominant side: Secondary | ICD-10-CM

## 2018-04-18 DIAGNOSIS — H9190 Unspecified hearing loss, unspecified ear: Secondary | ICD-10-CM | POA: Diagnosis present

## 2018-04-18 DIAGNOSIS — F0390 Unspecified dementia without behavioral disturbance: Secondary | ICD-10-CM | POA: Diagnosis present

## 2018-04-18 DIAGNOSIS — Z833 Family history of diabetes mellitus: Secondary | ICD-10-CM

## 2018-04-18 DIAGNOSIS — Z79890 Hormone replacement therapy (postmenopausal): Secondary | ICD-10-CM

## 2018-04-18 DIAGNOSIS — T83021A Displacement of indwelling urethral catheter, initial encounter: Secondary | ICD-10-CM | POA: Diagnosis present

## 2018-04-18 DIAGNOSIS — R1084 Generalized abdominal pain: Secondary | ICD-10-CM

## 2018-04-18 DIAGNOSIS — E039 Hypothyroidism, unspecified: Secondary | ICD-10-CM | POA: Diagnosis present

## 2018-04-18 DIAGNOSIS — I110 Hypertensive heart disease with heart failure: Secondary | ICD-10-CM | POA: Diagnosis present

## 2018-04-18 DIAGNOSIS — I700 Atherosclerosis of aorta: Secondary | ICD-10-CM | POA: Diagnosis present

## 2018-04-18 DIAGNOSIS — Z79899 Other long term (current) drug therapy: Secondary | ICD-10-CM

## 2018-04-18 DIAGNOSIS — I5032 Chronic diastolic (congestive) heart failure: Secondary | ICD-10-CM | POA: Diagnosis present

## 2018-04-18 DIAGNOSIS — H548 Legal blindness, as defined in USA: Secondary | ICD-10-CM | POA: Diagnosis present

## 2018-04-18 DIAGNOSIS — Z66 Do not resuscitate: Secondary | ICD-10-CM | POA: Diagnosis present

## 2018-04-18 DIAGNOSIS — N390 Urinary tract infection, site not specified: Secondary | ICD-10-CM

## 2018-04-18 DIAGNOSIS — M1990 Unspecified osteoarthritis, unspecified site: Secondary | ICD-10-CM | POA: Diagnosis present

## 2018-04-18 DIAGNOSIS — Z801 Family history of malignant neoplasm of trachea, bronchus and lung: Secondary | ICD-10-CM

## 2018-04-18 DIAGNOSIS — A419 Sepsis, unspecified organism: Secondary | ICD-10-CM | POA: Diagnosis present

## 2018-04-18 DIAGNOSIS — J449 Chronic obstructive pulmonary disease, unspecified: Secondary | ICD-10-CM | POA: Diagnosis present

## 2018-04-18 DIAGNOSIS — Z9842 Cataract extraction status, left eye: Secondary | ICD-10-CM

## 2018-04-18 DIAGNOSIS — Z87891 Personal history of nicotine dependence: Secondary | ICD-10-CM

## 2018-04-18 DIAGNOSIS — G9341 Metabolic encephalopathy: Secondary | ICD-10-CM | POA: Diagnosis present

## 2018-04-18 DIAGNOSIS — Z515 Encounter for palliative care: Secondary | ICD-10-CM | POA: Diagnosis present

## 2018-04-18 DIAGNOSIS — E785 Hyperlipidemia, unspecified: Secondary | ICD-10-CM | POA: Diagnosis present

## 2018-04-18 DIAGNOSIS — T83091A Other mechanical complication of indwelling urethral catheter, initial encounter: Secondary | ICD-10-CM | POA: Diagnosis present

## 2018-04-18 HISTORY — DX: Retention of urine, unspecified: R33.9

## 2018-04-18 HISTORY — DX: Hemiplegia, unspecified affecting right dominant side: G81.91

## 2018-04-18 HISTORY — DX: Hyperlipidemia, unspecified: E78.5

## 2018-04-18 HISTORY — DX: Dysarthria and anarthria: R47.1

## 2018-04-18 HISTORY — DX: Disorder of kidney and ureter, unspecified: N28.9

## 2018-04-18 HISTORY — DX: Essential (primary) hypertension: I10

## 2018-04-18 LAB — URINALYSIS, ROUTINE W REFLEX MICROSCOPIC
Bilirubin Urine: NEGATIVE
Glucose, UA: NEGATIVE mg/dL
Hgb urine dipstick: NEGATIVE
KETONES UR: NEGATIVE mg/dL
Nitrite: NEGATIVE
Specific Gravity, Urine: 1.019 (ref 1.005–1.030)
WBC, UA: >50 WBC/hpf — ABNORMAL HIGH (ref 0–5)
pH: 9 — ABNORMAL HIGH (ref 5.0–8.0)

## 2018-04-18 LAB — I-STAT CHEM 8, ED
BUN: 42 mg/dL — AB (ref 8–23)
CALCIUM ION: 1.16 mmol/L (ref 1.15–1.40)
CHLORIDE: 107 mmol/L (ref 98–111)
CREATININE: 1.4 mg/dL — AB (ref 0.44–1.00)
GLUCOSE: 134 mg/dL — AB (ref 70–99)
HCT: 46 % (ref 36.0–46.0)
Hemoglobin: 15.6 g/dL — ABNORMAL HIGH (ref 12.0–15.0)
POTASSIUM: 4.4 mmol/L (ref 3.5–5.1)
Sodium: 139 mmol/L (ref 135–145)
TCO2: 26 mmol/L (ref 22–32)

## 2018-04-18 MED ORDER — ONDANSETRON HCL 4 MG/2ML IJ SOLN
4.0000 mg | Freq: Once | INTRAMUSCULAR | Status: AC
  Administered 2018-04-19: 4 mg via INTRAVENOUS

## 2018-04-18 MED ORDER — SODIUM CHLORIDE 0.9 % IV SOLN
INTRAVENOUS | Status: DC
  Administered 2018-04-18 – 2018-04-22 (×7): via INTRAVENOUS

## 2018-04-18 MED ORDER — ONDANSETRON HCL 4 MG/2ML IJ SOLN
INTRAMUSCULAR | Status: AC
  Administered 2018-04-19: 4 mg via INTRAVENOUS
  Filled 2018-04-18: qty 2

## 2018-04-18 MED ORDER — SODIUM CHLORIDE 0.9 % IV BOLUS
500.0000 mL | Freq: Once | INTRAVENOUS | Status: AC
  Administered 2018-04-18: 500 mL via INTRAVENOUS

## 2018-04-18 MED ORDER — IOPAMIDOL (ISOVUE-300) INJECTION 61%
80.0000 mL | Freq: Once | INTRAVENOUS | Status: AC | PRN
  Administered 2018-04-18: 80 mL via INTRAVENOUS

## 2018-04-18 NOTE — ED Provider Notes (Signed)
[facility] EMERGENCY DEPARTMENT Provider Note   CSN: [id number] Arrival date & time: 04/18/18  1834     History   Chief Complaint Chief Complaint  Patient presents with  . Abdominal Pain  . Diarrhea    HPI Rae MarSara L Requena is a 67 y.o. female.  The history is provided by the nursing home and the EMS personnel. The history is limited by the condition of the patient (Hx dementia).  Abdominal Pain   Associated symptoms include diarrhea.  Diarrhea   Associated symptoms include abdominal pain.   Pt was seen at 1850.  Per EMS and NH report:  Pt sent to the ED for c/o abd pain and "explosive diarrhea." Pt c/o abd pain on arrival to ED. Pt herself has hx of dementia. No reported N/V, no fevers, no black or blood in stools.   Past Medical History:  Diagnosis Date  . Abnormality of gait 08/02/2013  . Anemia   . Arthritis    osteoarthritis  . Blind   . CHF (congestive heart failure) (HCC)   . COPD (chronic obstructive pulmonary disease) (HCC)   . DDD (degenerative disc disease)   . Depression   . Dysarthria   . HOH (hard of hearing)   . Hyperlipidemia   . Hypertension   . Hypothyroidism   . Memory disorder 08/02/2013  . Pneumonia   . Renal disorder   . Right hemiparesis (HCC)   . Sepsis (HCC)   . Sepsis (HCC)   . Shortness of breath   . Stroke (HCC)   . Tobacco abuse 11/02/2013  . Urinary retention     Patient Active Problem List   Diagnosis Date Noted  . ARF (acute renal failure) (HCC) 11/24/2017  . MRSA bacteremia 12/31/2016  . Sepsis secondary to UTI (HCC) 12/30/2016  . Urinary retention 07/18/2016  . Essential hypertension 07/18/2016  . Hyperlipidemia 07/18/2016  . Ischemic stroke (HCC) - L insular embolic infarct s/p tPA, source unknown 07/14/2016  . Acute right hemiparesis (HCC)   . Dysarthria   . Dementia without behavioral disturbance (HCC)   . Syncope 11/02/2013  . Syncope and collapse 11/02/2013  . Blind 11/02/2013  . Hypothyroidism 11/02/2013  .  History of CVA (cerebrovascular accident) 11/02/2013  . Bradycardia 11/02/2013  . Tobacco abuse 11/02/2013  . Memory disorder 08/02/2013  . Abnormality of gait 08/02/2013  . Chronic diastolic heart failure (HCC) 12/19/2011  . ANEMIA 05/04/2009  . LEUKOCYTOSIS UNSPECIFIED 05/04/2009  . 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 ABDOMINAL PAIN, LOWER 05/04/2009    Past Surgical History:  Procedure Laterality Date  . ARM DEBRIDEMENT     as child  . CATARACT EXTRACTION W/PHACO  04/28/2011   Procedure: CATARACT EXTRACTION PHACO AND INTRAOCULAR LENS PLACEMENT (IOC);  Surgeon: Susa Simmondsarroll F Haines;  Location: [facility];  Service: Ophthalmology;  Laterality: Left;  CDE: 5.37  . CESAREAN SECTION    . 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 STRABISMUS SURGERY     age 89  . TEE WITHOUT CARDIOVERSION N/A 01/02/2017   Procedure: TRANSESOPHAGEAL ECHOCARDIOGRAM (TEE);  Surgeon: Antoine PocheBranch, Jonathan F, MD;  Location: [facility];  Service: Endoscopy;  Laterality: N/A;     OB History    Gravida  2   Para  2   Term  2   Preterm      AB      Living  2     SAB  TAB      Ectopic      Multiple      Live Births               Home Medications    Prior to Admission medications   Medication Sig Start Date End Date Taking? Authorizing Provider  acetaminophen (TYLENOL) 650 MG CR tablet Take 650 mg by mouth 2 (two) times daily.   Yes [provider]  ARIPiprazole (ABILIFY) 2 MG tablet Take 2 mg by mouth at bedtime.   Yes [provider]  atorvastatin (LIPITOR) 10 MG tablet Take 10 mg by mouth every evening.    Yes [provider]  cholecalciferol (VITAMIN D) 400 UNITS TABS Take 800 Units by mouth daily.     Yes [provider]  donepezil (ARICEPT) 5 MG tablet TAKE 1 TABLET BY MOUTH AT BEDTIME. Patient taking differently: Take 5 mg by mouth at bedtime.  01/15/15  Yes  York Spaniel, MD  levothyroxine (SYNTHROID, LEVOTHROID) 25 MCG tablet Take 25 mcg by mouth daily.     Yes [provider]  LORazepam (ATIVAN) 0.5 MG tablet Take 0.5 mg by mouth 2 (two) times daily.    Yes [provider]  memantine (NAMENDA) 10 MG tablet Take 10 mg by mouth 2 (two) times daily.   Yes [provider]  mirtazapine (REMERON) 15 MG tablet Take 7.5 mg by mouth at bedtime.  08/09/16  Yes [provider]  Multiple Vitamin (DAILY-VITE PO) Take 1 tablet by mouth daily.   Yes [provider]  Multiple Vitamins-Minerals (ICAPS AREDS FORMULA PO) Take 1 tablet by mouth daily.   Yes [provider]  sertraline (ZOLOFT) 50 MG tablet Take 75 mg by mouth at bedtime.    Yes [provider]  Vitamins A & D (VITAMIN A & D) ointment Apply 1 application topically 2 (two) times daily. Skin is washed, dried, then applied twice daily per [facility]   Yes [provider]  cephALEXin (KEFLEX) 500 MG capsule Take 1 capsule (500 mg total) by mouth 4 (four) times daily. Patient not taking: Reported on 04/18/2018 03/12/18   Vanetta Mulders, MD    Family History Family History  Problem Relation Age of Onset  . Diabetes Brother   . Diabetes Brother   . Diabetes Mother   . Cancer - Lung Father   . Colon cancer Unknown        neg hx?     Social History Social History   Tobacco Use  . Smoking status: Former Smoker    Packs/day: 0.50    Types: Cigarettes  . Smokeless tobacco: Never Used  Substance Use Topics  . Alcohol use: No  . Drug use: No     Allergies   Patient has no known allergies.   Review of Systems Review of Systems  Unable to perform ROS: Dementia  Gastrointestinal: Positive for abdominal pain and diarrhea.     Physical Exam Updated Vital Signs BP 127/72   Pulse 97   Temp 99.9 F (37.7 C) (Axillary)   Resp (!) 30   Wt 53.2 kg   SpO2 95%   BMI 20.78 kg/m   Physical Exam 1855: Physical  examination:  Nursing notes reviewed; Vital signs and O2 SAT reviewed;  Constitutional: Well developed, Well nourished, In no acute distress; Head:  Normocephalic, atraumatic; Eyes: EOMI, PERRL, No scleral icterus; ENMT: Mouth and pharynx normal, Mucous membranes dry; Neck: Supple, Full range of motion, No lymphadenopathy; Cardiovascular:  Regular rate and rhythm, No gallop; Respiratory: Breath sounds clear & equal bilaterally, No wheezes. Normal respiratory effort/excursion; Chest: Nontender, Movement normal; Abdomen: Soft, +diffuse tenderness to palp. Nondistended, Normal bowel sounds; Genitourinary: No CVA tenderness; Extremities: Peripheral pulses normal, No tenderness, No edema, No calf edema or asymmetry.; Neuro: Awake, alert. Laying eyes open, mouth open. Speech minimal.  Moves all extremities on stretcher spontaneously and to command without apparent gross focal motor deficits.;; Skin: Color normal, Warm, Dry.   ED Treatments / Results  Labs (all labs ordered are listed, but only abnormal results are displayed)   EKG EKG Interpretation  Date/Time:  Sunday April 18 2018 21:10:41 EST Ventricular Rate:  91 PR Interval:    QRS Duration: 74 QT Interval:  347 QTC Calculation: 427 R Axis:   10 Text Interpretation:  Sinus rhythm Low voltage, precordial leads Borderline T abnormalities, anterior leads When compared with ECG of 12/29/2016 No significant change was found Confirmed by Samuel Jester [phone]) on 04/18/2018 9:19:07 PM   Radiology   Procedures Procedures (including critical care time)  Medications Ordered in ED Medications - No data to display   Initial Impression / Assessment and Plan / ED Course  I have reviewed the triage vital signs and the nursing notes.  Pertinent labs & imaging results that were available during my care of the patient were reviewed by me and considered in my medical decision making (see chart for details).  MDM Reviewed: previous chart, nursing  note and vitals Reviewed previous: ECG Interpretation: ECG and x-ray   Results for orders placed or performed during the hospital encounter of 04/18/18  Urinalysis, Routine w reflex microscopic  Result Value Ref Range   Color, Urine AMBER (A) YELLOW   APPearance TURBID (A) CLEAR   Specific Gravity, Urine 1.019 1.005 - 1.030   pH 9.0 (H) 5.0 - 8.0   Glucose, UA NEGATIVE NEGATIVE mg/dL   Hgb urine dipstick NEGATIVE NEGATIVE   Bilirubin Urine NEGATIVE NEGATIVE   Ketones, ur NEGATIVE NEGATIVE mg/dL   Protein, ur >=621 (A) NEGATIVE mg/dL   Nitrite NEGATIVE NEGATIVE   Leukocytes, UA LARGE (A) NEGATIVE   RBC / HPF 21-50 0 - 5 RBC/hpf   WBC, UA >50 (H) 0 - 5 WBC/hpf   Bacteria, UA MANY (A) NONE SEEN   Squamous Epithelial / LPF 0-5 0 - 5   Dg Chest 1 View Result Date: 04/18/2018 CLINICAL DATA:  Diarrhea and abdominal pain. EXAM: CHEST  1 VIEW COMPARISON:  03/12/2018 FINDINGS: Upper normal heart size with prominent left epicardial fat pad. The lungs appear clear. Mediastinum unremarkable. No blunting of the costophrenic angles. IMPRESSION: 1.  No active cardiopulmonary disease is radiographically apparent. Electronically Signed   By: Gaylyn Rong M.D.   On: 04/18/2018 20:31    2225:  Labs and CT pending. Dispo based on results. Sign out to oncoming EDP.     Final Clinical Impressions(s) / ED Diagnoses   Final diagnoses:  None    ED Discharge Orders    None       Samuel Jester, DO 04/18/18 2228

## 2018-04-18 NOTE — ED Triage Notes (Signed)
EMS reports [facility] a [facility] and c/o abd pain and "explosive" diarrhea today.  PT alert. C/O abd pain.

## 2018-04-19 ENCOUNTER — Emergency Department (HOSPITAL_COMMUNITY): Payer: Medicare Other

## 2018-04-19 ENCOUNTER — Other Ambulatory Visit: Payer: Self-pay

## 2018-04-19 DIAGNOSIS — E872 Acidosis: Secondary | ICD-10-CM | POA: Diagnosis present

## 2018-04-19 DIAGNOSIS — Y738 Miscellaneous gastroenterology and urology devices associated with adverse incidents, not elsewhere classified: Secondary | ICD-10-CM | POA: Diagnosis present

## 2018-04-19 DIAGNOSIS — T83021A Displacement of indwelling urethral catheter, initial encounter: Secondary | ICD-10-CM | POA: Diagnosis present

## 2018-04-19 DIAGNOSIS — Z66 Do not resuscitate: Secondary | ICD-10-CM | POA: Diagnosis present

## 2018-04-19 DIAGNOSIS — J449 Chronic obstructive pulmonary disease, unspecified: Secondary | ICD-10-CM | POA: Diagnosis present

## 2018-04-19 DIAGNOSIS — Z79899 Other long term (current) drug therapy: Secondary | ICD-10-CM | POA: Diagnosis not present

## 2018-04-19 DIAGNOSIS — F329 Major depressive disorder, single episode, unspecified: Secondary | ICD-10-CM | POA: Diagnosis present

## 2018-04-19 DIAGNOSIS — N1330 Unspecified hydronephrosis: Secondary | ICD-10-CM | POA: Diagnosis present

## 2018-04-19 DIAGNOSIS — R1084 Generalized abdominal pain: Secondary | ICD-10-CM | POA: Diagnosis present

## 2018-04-19 DIAGNOSIS — F419 Anxiety disorder, unspecified: Secondary | ICD-10-CM | POA: Diagnosis present

## 2018-04-19 DIAGNOSIS — N3289 Other specified disorders of bladder: Secondary | ICD-10-CM | POA: Diagnosis present

## 2018-04-19 DIAGNOSIS — G9341 Metabolic encephalopathy: Secondary | ICD-10-CM | POA: Diagnosis present

## 2018-04-19 DIAGNOSIS — Z833 Family history of diabetes mellitus: Secondary | ICD-10-CM | POA: Diagnosis not present

## 2018-04-19 DIAGNOSIS — I5032 Chronic diastolic (congestive) heart failure: Secondary | ICD-10-CM | POA: Diagnosis present

## 2018-04-19 DIAGNOSIS — E785 Hyperlipidemia, unspecified: Secondary | ICD-10-CM | POA: Diagnosis present

## 2018-04-19 DIAGNOSIS — H548 Legal blindness, as defined in USA: Secondary | ICD-10-CM | POA: Diagnosis present

## 2018-04-19 DIAGNOSIS — E039 Hypothyroidism, unspecified: Secondary | ICD-10-CM | POA: Diagnosis present

## 2018-04-19 DIAGNOSIS — Z515 Encounter for palliative care: Secondary | ICD-10-CM | POA: Diagnosis present

## 2018-04-19 DIAGNOSIS — E876 Hypokalemia: Secondary | ICD-10-CM | POA: Diagnosis present

## 2018-04-19 DIAGNOSIS — E43 Unspecified severe protein-calorie malnutrition: Secondary | ICD-10-CM | POA: Diagnosis present

## 2018-04-19 DIAGNOSIS — T83091A Other mechanical complication of indwelling urethral catheter, initial encounter: Secondary | ICD-10-CM | POA: Diagnosis present

## 2018-04-19 DIAGNOSIS — A419 Sepsis, unspecified organism: Secondary | ICD-10-CM | POA: Diagnosis not present

## 2018-04-19 DIAGNOSIS — F0390 Unspecified dementia without behavioral disturbance: Secondary | ICD-10-CM | POA: Diagnosis present

## 2018-04-19 DIAGNOSIS — N390 Urinary tract infection, site not specified: Secondary | ICD-10-CM | POA: Diagnosis not present

## 2018-04-19 DIAGNOSIS — N136 Pyonephrosis: Secondary | ICD-10-CM | POA: Diagnosis present

## 2018-04-19 DIAGNOSIS — I700 Atherosclerosis of aorta: Secondary | ICD-10-CM | POA: Diagnosis present

## 2018-04-19 DIAGNOSIS — H9190 Unspecified hearing loss, unspecified ear: Secondary | ICD-10-CM | POA: Diagnosis present

## 2018-04-19 DIAGNOSIS — I69351 Hemiplegia and hemiparesis following cerebral infarction affecting right dominant side: Secondary | ICD-10-CM | POA: Diagnosis not present

## 2018-04-19 LAB — CBC WITH DIFFERENTIAL/PLATELET
Abs Immature Granulocytes: 0.04 10*3/uL (ref 0.00–0.07)
Abs Immature Granulocytes: 0.07 10*3/uL (ref 0.00–0.07)
BASOS ABS: 0 10*3/uL (ref 0.0–0.1)
Basophils Absolute: 0 10*3/uL (ref 0.0–0.1)
Basophils Relative: 0 %
Basophils Relative: 0 %
EOS PCT: 0 %
EOS PCT: 0 %
Eosinophils Absolute: 0 10*3/uL (ref 0.0–0.5)
Eosinophils Absolute: 0.1 10*3/uL (ref 0.0–0.5)
HEMATOCRIT: 42.6 % (ref 36.0–46.0)
HEMATOCRIT: 39 % (ref 36.0–46.0)
HEMOGLOBIN: 12.8 g/dL (ref 12.0–15.0)
Hemoglobin: 11.9 g/dL — ABNORMAL LOW (ref 12.0–15.0)
Immature Granulocytes: 0 %
Immature Granulocytes: 1 %
LYMPHS PCT: 10 %
Lymphocytes Relative: 11 %
Lymphs Abs: 1.3 10*3/uL (ref 0.7–4.0)
Lymphs Abs: 1.6 10*3/uL (ref 0.7–4.0)
MCH: 26.8 pg (ref 26.0–34.0)
MCH: 27.3 pg (ref 26.0–34.0)
MCHC: 30 g/dL (ref 30.0–36.0)
MCHC: 30.5 g/dL (ref 30.0–36.0)
MCV: 89.1 fL (ref 80.0–100.0)
MCV: 89.4 fL (ref 80.0–100.0)
MONO ABS: 1 10*3/uL (ref 0.1–1.0)
Monocytes Absolute: 0.9 10*3/uL (ref 0.1–1.0)
Monocytes Relative: 8 %
Monocytes Relative: 7 %
NRBC: 0 % (ref 0.0–0.2)
Neutro Abs: 10.3 10*3/uL — ABNORMAL HIGH (ref 1.7–7.7)
Neutro Abs: 11.7 10*3/uL — ABNORMAL HIGH (ref 1.7–7.7)
Neutrophils Relative %: 82 %
Neutrophils Relative %: 81 %
Platelets: 353 10*3/uL (ref 150–400)
Platelets: 328 10*3/uL (ref 150–400)
RBC: 4.78 MIL/uL (ref 3.87–5.11)
RBC: 4.36 MIL/uL (ref 3.87–5.11)
RDW: 14.5 % (ref 11.5–15.5)
RDW: 14.6 % (ref 11.5–15.5)
WBC: 12.7 10*3/uL — ABNORMAL HIGH (ref 4.0–10.5)
WBC: 14.4 10*3/uL — AB (ref 4.0–10.5)
nRBC: 0 % (ref 0.0–0.2)

## 2018-04-19 LAB — URINALYSIS, ROUTINE W REFLEX MICROSCOPIC
Bacteria, UA: NONE SEEN
Bilirubin Urine: NEGATIVE
Glucose, UA: NEGATIVE mg/dL
Ketones, ur: NEGATIVE mg/dL
Nitrite: NEGATIVE
SPECIFIC GRAVITY, URINE: 1.013 (ref 1.005–1.030)
pH: 8 (ref 5.0–8.0)

## 2018-04-19 LAB — COMPREHENSIVE METABOLIC PANEL
ALBUMIN: 3.1 g/dL — AB (ref 3.5–5.0)
ALK PHOS: 53 U/L (ref 38–126)
ALT: 13 U/L (ref 0–44)
ALT: 12 U/L (ref 0–44)
AST: 21 U/L (ref 15–41)
AST: 21 U/L (ref 15–41)
Albumin: 3.6 g/dL (ref 3.5–5.0)
Alkaline Phosphatase: 61 U/L (ref 38–126)
Anion gap: 10 (ref 5–15)
Anion gap: 9 (ref 5–15)
BILIRUBIN TOTAL: 0.5 mg/dL (ref 0.3–1.2)
BILIRUBIN TOTAL: 0.4 mg/dL (ref 0.3–1.2)
BUN: 32 mg/dL — AB (ref 8–23)
BUN: 26 mg/dL — ABNORMAL HIGH (ref 8–23)
CALCIUM: 9.5 mg/dL (ref 8.9–10.3)
CALCIUM: 8.6 mg/dL — AB (ref 8.9–10.3)
CO2: 22 mmol/L (ref 22–32)
CO2: 19 mmol/L — AB (ref 22–32)
CREATININE: 1.06 mg/dL — AB (ref 0.44–1.00)
Chloride: 107 mmol/L (ref 98–111)
Chloride: 116 mmol/L — ABNORMAL HIGH (ref 98–111)
Creatinine, Ser: 1.47 mg/dL — ABNORMAL HIGH (ref 0.44–1.00)
GFR calc Af Amer: 41 mL/min — ABNORMAL LOW (ref >60)
GFR calc Af Amer: >60 mL/min (ref >60)
GFR calc non Af Amer: 53 mL/min — ABNORMAL LOW (ref >60)
GFR, EST NON AFRICAN AMERICAN: 35 mL/min — AB (ref >60)
Glucose, Bld: 135 mg/dL — ABNORMAL HIGH (ref 70–99)
Glucose, Bld: 126 mg/dL — ABNORMAL HIGH (ref 70–99)
Potassium: 3.9 mmol/L (ref 3.5–5.1)
Potassium: 3.6 mmol/L (ref 3.5–5.1)
SODIUM: 144 mmol/L (ref 135–145)
Sodium: 139 mmol/L (ref 135–145)
TOTAL PROTEIN: 7.6 g/dL (ref 6.5–8.1)
TOTAL PROTEIN: 6.8 g/dL (ref 6.5–8.1)

## 2018-04-19 LAB — MRSA PCR SCREENING: MRSA by PCR: NEGATIVE

## 2018-04-19 LAB — LACTIC ACID, PLASMA
Lactic Acid, Venous: 2.4 mmol/L (ref 0.5–1.9)
Lactic Acid, Venous: 1.7 mmol/L (ref 0.5–1.9)
Lactic Acid, Venous: 1.3 mmol/L (ref 0.5–1.9)

## 2018-04-19 LAB — PROTIME-INR
INR: 1.03
Prothrombin Time: 13.4 seconds (ref 11.4–15.2)

## 2018-04-19 LAB — TROPONIN I: Troponin I: <0.03 ng/mL (ref <0.03)

## 2018-04-19 LAB — TSH: TSH: 0.462 u[IU]/mL (ref 0.350–4.500)

## 2018-04-19 LAB — LIPASE, BLOOD: LIPASE: 30 U/L (ref 11–51)

## 2018-04-19 MED ORDER — LORAZEPAM 0.5 MG PO TABS
0.5000 mg | ORAL_TABLET | Freq: Two times a day (BID) | ORAL | Status: DC
  Administered 2018-04-19 – 2018-04-24 (×11): 0.5 mg via ORAL
  Filled 2018-04-19 (×11): qty 1

## 2018-04-19 MED ORDER — SODIUM CHLORIDE 0.9 % IV SOLN
2.0000 g | Freq: Once | INTRAVENOUS | Status: AC
  Administered 2018-04-19: 2 g via INTRAVENOUS
  Filled 2018-04-19: qty 2

## 2018-04-19 MED ORDER — SODIUM CHLORIDE 0.9 % IV SOLN
1.0000 g | Freq: Once | INTRAVENOUS | Status: AC
  Administered 2018-04-19: 1 g via INTRAVENOUS
  Filled 2018-04-19: qty 10

## 2018-04-19 MED ORDER — SODIUM CHLORIDE 0.9 % IV BOLUS
1000.0000 mL | Freq: Once | INTRAVENOUS | Status: AC
  Administered 2018-04-19: 1000 mL via INTRAVENOUS

## 2018-04-19 MED ORDER — ACETAMINOPHEN 650 MG RE SUPP
650.0000 mg | Freq: Four times a day (QID) | RECTAL | Status: DC | PRN
  Administered 2018-04-19: 650 mg via RECTAL
  Filled 2018-04-19: qty 1

## 2018-04-19 MED ORDER — LEVOTHYROXINE SODIUM 25 MCG PO TABS
25.0000 ug | ORAL_TABLET | Freq: Every day | ORAL | Status: DC
  Administered 2018-04-19 – 2018-04-24 (×6): 25 ug via ORAL
  Filled 2018-04-19 (×6): qty 1

## 2018-04-19 MED ORDER — ATORVASTATIN CALCIUM 10 MG PO TABS
10.0000 mg | ORAL_TABLET | Freq: Every evening | ORAL | Status: DC
  Administered 2018-04-20 – 2018-04-23 (×4): 10 mg via ORAL
  Filled 2018-04-19 (×4): qty 1

## 2018-04-19 MED ORDER — SODIUM CHLORIDE 0.9 % IV SOLN
2.0000 g | INTRAVENOUS | Status: DC
  Administered 2018-04-19 – 2018-04-21 (×2): 2 g via INTRAVENOUS
  Filled 2018-04-19 (×4): qty 2

## 2018-04-19 MED ORDER — SERTRALINE HCL 50 MG PO TABS
75.0000 mg | ORAL_TABLET | Freq: Every day | ORAL | Status: DC
  Administered 2018-04-19 – 2018-04-23 (×5): 75 mg via ORAL
  Filled 2018-04-19 (×5): qty 2

## 2018-04-19 MED ORDER — ACETAMINOPHEN 325 MG PO TABS
650.0000 mg | ORAL_TABLET | Freq: Four times a day (QID) | ORAL | Status: DC | PRN
  Administered 2018-04-20: 650 mg via ORAL
  Filled 2018-04-19: qty 2

## 2018-04-19 MED ORDER — DONEPEZIL HCL 5 MG PO TABS
5.0000 mg | ORAL_TABLET | Freq: Every day | ORAL | Status: DC
  Administered 2018-04-19 – 2018-04-23 (×5): 5 mg via ORAL
  Filled 2018-04-19 (×5): qty 1

## 2018-04-19 MED ORDER — MIRTAZAPINE 15 MG PO TABS
7.5000 mg | ORAL_TABLET | Freq: Every day | ORAL | Status: DC
  Administered 2018-04-19 – 2018-04-23 (×5): 7.5 mg via ORAL
  Filled 2018-04-19 (×5): qty 1

## 2018-04-19 MED ORDER — ARIPIPRAZOLE 2 MG PO TABS
2.0000 mg | ORAL_TABLET | Freq: Every day | ORAL | Status: DC
  Administered 2018-04-19 – 2018-04-23 (×5): 2 mg via ORAL
  Filled 2018-04-19 (×7): qty 1

## 2018-04-19 MED ORDER — SODIUM CHLORIDE 0.9 % IV SOLN
INTRAVENOUS | Status: AC
  Filled 2018-04-19: qty 2

## 2018-04-19 MED ORDER — ACETAMINOPHEN 325 MG PO TABS: 650.0000 mg | ORAL_TABLET | Freq: Two times a day (BID) | ORAL | Status: DC

## 2018-04-19 MED ORDER — MEMANTINE HCL 10 MG PO TABS
10.0000 mg | ORAL_TABLET | Freq: Two times a day (BID) | ORAL | Status: DC
  Administered 2018-04-19 – 2018-04-24 (×11): 10 mg via ORAL
  Filled 2018-04-19 (×11): qty 1

## 2018-04-19 MED ORDER — HEPARIN SODIUM (PORCINE) 5000 UNIT/ML IJ SOLN
5000.0000 [IU] | Freq: Three times a day (TID) | INTRAMUSCULAR | Status: DC
  Administered 2018-04-19 – 2018-04-24 (×17): 5000 [IU] via SUBCUTANEOUS
  Filled 2018-04-19 (×17): qty 1

## 2018-04-19 NOTE — ED Notes (Signed)
Pt placed on 2L [place], O2 sat 94%

## 2018-04-19 NOTE — Progress Notes (Signed)
Per HPI from Dr. Robb Matarrtiz: Kimberly [name] is a 67 y.o. female with medical history significant of gait abnormality, anemia, osteoarthritis, blindness, unspecified CHF, COPD, degenerative disc disease, depression, dysarthria, impaired hearing, hyperlipidemia, hypertension, hypothyroidism, dementia, history of pneumonia, chronic renal disease, right-sided hemiparesis, history of unspecified sepsis, history of CVA, tobacco abuse, history of urinary retention who was [facility] due to abdominal pain and explosive diarrhea earlier in the day. The history is limited due to the patient's history of dementia.  Patient was admitted for sepsis secondary to UTI related to malfunctioning Foley catheter.  She was noted to have associated hydroureteronephrosis on CT imaging.  Foley placement resulted in significant urinary output.  Will reimage by a.m. to ensure that this has resolved.  We will check TSH.  Patient does have history of dementia with some behavioral disturbances noted.  She does not have any signs of guarding, rigidity, or tenderness on abdominal examination.  We will continue on cefepime as prescribed and recheck labs in a.m. along with lactic acid level.  She is DNR.

## 2018-04-19 NOTE — Progress Notes (Signed)
Pharmacy Antibiotic Note  Rae MarSara L Dewan is a 67 y.o. female admitted on 04/18/2018 with UTI.  Pharmacy has been consulted for Cefepime dosing.  Plan: Cefepime 2000 mg IV every 24 hours. Monitor labs, c/s, and patient improvement.  Weight: 111 lb 5.3 oz (50.5 kg)  Temp (24hrs), Avg:100.5 F (38.1 C), Min:99.9 F (37.7 C), Max:101 F (38.3 C)  Recent Labs  Lab 04/18/18 2245 04/18/18 2246 04/18/18 2330 04/19/18 0224 04/19/18 0502  WBC 12.7*  --   --   --  14.4*  CREATININE 1.47*  --  1.40*  --  1.06*  LATICACIDVEN  --  2.4*  --  1.7 1.3    Estimated Creatinine Clearance: 40.5 mL/min (A) (by C-G formula based on SCr of 1.06 mg/dL (H)).    No Known Allergies  Antimicrobials this admission: Cefepime 11/25 >>  Ceftriaxone 11/25 x 1  Dose adjustments this admission: Cefepime  Microbiology results: 11/25 BCx: ngtd 11/25 UCx: pending   11/25 MRSA PCR: negative  Thank you for allowing pharmacy to be a part of this patient's care.  Tad MooreSteven C Conner Muegge 04/19/2018 8:31 AM

## 2018-04-19 NOTE — ED Provider Notes (Addendum)
  Physical Exam  BP 138/78   Pulse 98   Temp 99.9 F (37.7 C) (Axillary)   Resp (!) 27   Wt 53.2 kg   SpO2 96%   BMI 20.78 kg/m   Physical Exam  ED Course/Procedures     Procedures  MDM   Assuming care of Kimberly [name] from Dr. Estell HarpinZammit. Patient was actually seen by Dr. Clarene DukeMcManus.  She had come in from nursing home with chief complaint of abdominal pain.  Patient was not able to provide meaningful history because of her dementia.  At this time labs are pending and so is a CT scan.  Urine analysis shows pyuria along with many bacteria and RBCs. Anticipate admission.  12:28 AM RN just came by and informed me that while in the CT scan patient vomited.  She was laying supine when she started vomiting and her O2 sats dropped to the 80s.  On my evaluation patient's O2 sats is about 90% and she is on 2 L of oxygen.  She has rhonchus breath sounds diffusely -and I suspect that she has aspirated.  She is also tachycardic in the 120s.  The abnormal vital signs have the anticipate moving forward will be because of her aspiration.  X-ray of the chest has been ordered.  We will give her antibiotics once the CT scan results have come back.  Also, Foley catheter will be replaced at this time because it appears that the existing catheter is clogged.  12:48 AM CT confirms significant bladder distention. I have already put in an order for Foley catheter placement.  X-ray is pending. We will send another UA with culture. Patient is a very difficult stick, and I have advised nursing team to start antibiotics without blood cultures.  With the information available to us patient does not appear to be septic.  We do anticipate that she will have worsening of her vital signs given aspiration concerns.   1:19 AM Repeat UA sent because patient's original urine drawl was from existing bag.  We have replaced the Foley catheter and sent new urine. X-ray does not show any concerning finding and her O2 sats  have improved.  Patient is stable for admission.  1:44 AM Patient is now noted to have a fever. We will call code sepsis, however as mentioned above, antibiotics had been started without antibiotics because of poor IV access and inability for nursing team to get more blood.   CRITICAL CARE Performed by: Eldrige Pitkin   Total critical care time: 45 minutes  Critical care time was exclusive of separately billable procedures and treating other patients.  Critical care was necessary to treat or prevent imminent or life-threatening deterioration.  Critical care was time spent personally by me on the following activities: development of treatment plan with patient and/or surrogate as well as nursing, discussions with consultants, evaluation of patient's response to treatment, examination of patient, obtaining history from patient or surrogate, ordering and performing treatments and interventions, ordering and review of laboratory studies, ordering and review of radiographic studies, pulse oximetry and re-evaluation of patient's condition.    Derwood KaplanNanavati, Kimberly Kaelin, MD 04/19/18 [phone]

## 2018-04-19 NOTE — H&P (Signed)
History and Physical    Kimberly [name] WGN:[id number] DOB: [birth year] DOA: 04/18/2018  PCP: Pearson Grippe, MD   Patient coming from: [facility].  I have personally briefly reviewed patient's old medical records in [facility]  Chief Complaint: Abdominal pain and diarrhea.  HPI: Kimberly [name] is a 67 y.o. female with medical history significant of gait abnormality, anemia, osteoarthritis, blindness, unspecified CHF, COPD, degenerative disc disease, depression, dysarthria, impaired hearing, hyperlipidemia, hypertension, hypothyroidism, dementia, history of pneumonia, chronic renal disease, right-sided hemiparesis, history of unspecified sepsis, history of CVA, tobacco abuse, history of urinary retention who was brought from [facility] [place] [facility] due to abdominal pain and explosive diarrhea earlier in the day. The history is limited due to the patient's history of dementia.  ED Course: Initial vital signs temperature 99.9 F, pulse 92, respirations 30, blood pressure 140/84 mmHg and O2 sat 95% on room air.  The patient subsequently had a high her temperature in the emergency department of 101F.  She was given IV fluids and ceftriaxone in the emergency department.  Blood cultures x2 were drawn.  Her work-up shows a urinalysis with 7 nephric and proteinuria more than 300 mg/dL, large leukocyte esterase, 21-50 RBC and more than 50 WBC per hpf with many bacteria microscopic examination.  Her CBC showed a white count of 12.7 with 82% neutrophils, 10% lymphocytes and 8% monocytes.  Hemoglobin 12.8 g/dL and platelets 784.  PT and INR were normal.  CMP showed a BUN of 32, creatinine 1.47 (baseline is around 1.1) and glucose 135 mg/dL.  All other CMP values are within normal limits.  Lipase and troponin were normal.  Initial lactic acid was 2.4 and follow-up was 1.7 mmol/L.  Chest radiograph did not have any acute findings.  CT abdomen/pelvis with contrast shows severely distended  urinary bladder with severe bilateral hydroureteronephrosis however, the high 80s when they do not tell me that she had and I overlook it  Review of Systems: Unable to fully obtain..  Past Medical History:  Diagnosis Date  . Abnormality of gait 08/02/2013  . Anemia   . Arthritis    osteoarthritis  . Blind   . CHF (congestive heart failure) (HCC)   . COPD (chronic obstructive pulmonary disease) (HCC)   . DDD (degenerative disc disease)   . Depression   . Dysarthria   . HOH (hard of hearing)   . Hyperlipidemia   . Hypertension   . Hypothyroidism   . Memory disorder 08/02/2013  . Pneumonia   . Renal disorder   . Right hemiparesis (HCC)   . Sepsis (HCC)   . Sepsis (HCC)   . Shortness of breath   . Stroke (HCC)   . Tobacco abuse 11/02/2013  . Urinary retention     Past Surgical History:  Procedure Laterality Date  . ARM DEBRIDEMENT     as child  . CATARACT EXTRACTION W/PHACO  04/28/2011   Procedure: CATARACT EXTRACTION PHACO AND INTRAOCULAR LENS PLACEMENT (IOC);  Surgeon: Susa Simmonds;  Location: [facility] ORS;  Service: Ophthalmology;  Laterality: Left;  CDE: 5.37  . CESAREAN SECTION    . 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 STRABISMUS SURGERY     age 67  . TEE WITHOUT CARDIOVERSION N/A 01/02/2017   Procedure: TRANSESOPHAGEAL ECHOCARDIOGRAM (TEE);  Surgeon: Antoine Poche, MD;  Location: [facility] ENDO SUITE;  Service: Endoscopy;  Laterality: N/A;  reports that she has quit smoking. Her smoking use included cigarettes. She smoked 0.50 packs per day. She has never used smokeless tobacco. She reports that she does not drink alcohol or use drugs.  No Known Allergies  Family History  Problem Relation Age of Onset  . Diabetes Brother   . Diabetes Brother   . Diabetes Mother   . Cancer - Lung Father   . Colon cancer Unknown        neg hx?    Prior to Admission medications     Medication Sig Start Date End Date Taking? Authorizing Provider  acetaminophen (TYLENOL) 650 MG CR tablet Take 650 mg by mouth 2 (two) times daily.   Yes [provider]  ARIPiprazole (ABILIFY) 2 MG tablet Take 2 mg by mouth at bedtime.   Yes [provider]  atorvastatin (LIPITOR) 10 MG tablet Take 10 mg by mouth every evening.    Yes [provider]  cholecalciferol (VITAMIN D) 400 UNITS TABS Take 800 Units by mouth daily.     Yes [provider]  donepezil (ARICEPT) 5 MG tablet TAKE 1 TABLET BY MOUTH AT BEDTIME. Patient taking differently: Take 5 mg by mouth at bedtime.  01/15/15  Yes York Spaniel, MD  levothyroxine (SYNTHROID, LEVOTHROID) 25 MCG tablet Take 25 mcg by mouth daily.     Yes [provider]  LORazepam (ATIVAN) 0.5 MG tablet Take 0.5 mg by mouth 2 (two) times daily.    Yes [provider]  memantine (NAMENDA) 10 MG tablet Take 10 mg by mouth 2 (two) times daily.   Yes [provider]  mirtazapine (REMERON) 15 MG tablet Take 7.5 mg by mouth at bedtime.  08/09/16  Yes [provider]  Multiple Vitamin (DAILY-VITE PO) Take 1 tablet by mouth daily.   Yes [provider]  Multiple Vitamins-Minerals (ICAPS AREDS FORMULA PO) Take 1 tablet by mouth daily.   Yes [provider]  sertraline (ZOLOFT) 50 MG tablet Take 75 mg by mouth at bedtime.    Yes [provider]  Vitamins A & D (VITAMIN A & D) ointment Apply 1 application topically 2 (two) times daily. Skin is washed, dried, then applied twice daily per [facility]   Yes [provider]  cephALEXin (KEFLEX) 500 MG capsule Take 1 capsule (500 mg total) by mouth 4 (four) times daily. Patient not taking: Reported on 04/18/2018 03/12/18   Vanetta Mulders, MD    Physical Exam: Vitals:   04/19/18 0109 04/19/18 0117 04/19/18 0130 04/19/18 0200  BP: 125/76  118/69 121/72  Pulse: (!) 120  (!) 104 (!) 101  Resp: (!) 28  (!) 21 (!) 26   Temp:  (!) 101 F (38.3 C)    TempSrc:  Rectal    SpO2: 97%  97% 99%  Weight:        Constitutional: Frail, elderly female, looks older than chronological age.  Currently in NAD, calm, comfortable Eyes: PERRL, lids and conjunctivae normal ENMT: Mucous membranes are very dry. Posterior pharynx clear of any exudate or lesions. Neck: normal, supple, no masses, no thyromegaly Respiratory: Decreased breath sounds on bases, otherwise clear to auscultation bilaterally, no wheezing, no crackles. Normal respiratory effort. No accessory muscle use.  Cardiovascular: Tachycardic at 109 bpm, no murmurs / rubs / gallops. No extremity edema. 2+ pedal pulses. No carotid bruits.  Abdomen: Soft, mild suprapubic tenderness, no guarding or rebound, no masses palpated. No hepatosplenomegaly. Bowel sounds positive.  Musculoskeletal: no clubbing /  cyanosis.  Good ROM, no contractures. Normal muscle tone.  Skin: no rashes, lesions, ulcers on limited dermatological examination. Neurologic: CN 2-12 grossly intact. Sensation intact, DTR normal.  Generalized weakness. Psychiatric:  Alert and oriented x 1, partially oriented to place, disoriented to time. Normal mood.   Labs on Admission: I have personally reviewed following labs and imaging studies  CBC: Recent Labs  Lab 04/18/18 2245 04/18/18 2330  WBC 12.7*  --   NEUTROABS 10.3*  --   HGB 12.8 15.6*  HCT 42.6 46.0  MCV 89.1  --   PLT 353  --    Basic Metabolic Panel: Recent Labs  Lab 04/18/18 2245 04/18/18 2330  NA 139 139  K 3.9 4.4  CL 107 107  CO2 22  --   GLUCOSE 135* 134*  BUN 32* 42*  CREATININE 1.47* 1.40*  CALCIUM 9.5  --    GFR: Estimated Creatinine Clearance: 31.8 mL/min (A) (by C-G formula based on SCr of 1.4 mg/dL (H)). Liver Function Tests: Recent Labs  Lab 04/18/18 2245  AST 21  ALT 13  ALKPHOS 61  BILITOT 0.5  PROT 7.6  ALBUMIN 3.6   Recent Labs  Lab 04/18/18 2245  LIPASE 30   No results for input(s): AMMONIA  in the last 168 hours. Coagulation Profile: Recent Labs  Lab 04/18/18 2245  INR 1.03   Cardiac Enzymes: Recent Labs  Lab 04/18/18 2245  TROPONINI <0.03   BNP (last 3 results) No results for input(s): PROBNP in the last 8760 hours. HbA1C: No results for input(s): HGBA1C in the last 72 hours. CBG: No results for input(s): GLUCAP in the last 168 hours. Lipid Profile: No results for input(s): CHOL, HDL, LDLCALC, TRIG, CHOLHDL, LDLDIRECT in the last 72 hours. Thyroid Function Tests: No results for input(s): TSH, T4TOTAL, FREET4, T3FREE, THYROIDAB in the last 72 hours. Anemia Panel: No results for input(s): VITAMINB12, FOLATE, FERRITIN, TIBC, IRON, RETICCTPCT in the last 72 hours. Urine analysis:    Component Value Date/Time   COLORURINE AMBER (A) 04/19/2018 0048   APPEARANCEUR TURBID (A) 04/19/2018 0048   LABSPEC 1.013 04/19/2018 0048   PHURINE 8.0 04/19/2018 0048   GLUCOSEU NEGATIVE 04/19/2018 0048   HGBUR SMALL (A) 04/19/2018 0048   BILIRUBINUR NEGATIVE 04/19/2018 0048   KETONESUR NEGATIVE 04/19/2018 0048   PROTEINUR >=300 (A) 04/19/2018 0048   UROBILINOGEN 0.2 11/02/2013 1320   NITRITE NEGATIVE 04/19/2018 0048   LEUKOCYTESUR MODERATE (A) 04/19/2018 0048    Radiological Exams on Admission: Dg Chest 1 View  Result Date: 04/18/2018 CLINICAL DATA:  Diarrhea and abdominal pain. EXAM: CHEST  1 VIEW COMPARISON:  03/12/2018 FINDINGS: Upper normal heart size with prominent left epicardial fat pad. The lungs appear clear. Mediastinum unremarkable. No blunting of the costophrenic angles. IMPRESSION: 1.  No active cardiopulmonary disease is radiographically apparent. Electronically Signed   By: Gaylyn RongWalter  Liebkemann M.D.   On: 04/18/2018 20:31   Ct Abdomen Pelvis W Contrast  Result Date: 04/19/2018 CLINICAL DATA:  Abdominal pain and diarrhea EXAM: CT ABDOMEN AND PELVIS WITH CONTRAST TECHNIQUE: Multidetector CT imaging of the abdomen and pelvis was performed using the standard  protocol following bolus administration of intravenous contrast. CONTRAST:  80mL ISOVUE-300 IOPAMIDOL (ISOVUE-300) INJECTION 61% COMPARISON:  CT abdomen pelvis 12/29/2016 FINDINGS: LOWER CHEST: There is no basilar pleural or apical pericardial effusion. HEPATOBILIARY: The hepatic contours and density are normal. There is no intra- or extrahepatic biliary dilatation. Status post cholecystectomy. PANCREAS: The pancreatic parenchymal contours are normal and there is no  ductal dilatation. There is no peripancreatic fluid collection. SPLEEN: Normal. ADRENALS/URINARY TRACT: --Adrenal glands: Normal. --Right kidney/ureter: Severe hydroureter and moderate hydronephrosis. No nephrolithiasis or solid renal mass. --Left kidney/ureter: Severe hydroureter and moderate hydronephrosis. No nephrolithiasis or solid renal mass. --Urinary bladder: The urinary bladder is severely distended. There is a Foley catheter within the bladder. STOMACH/BOWEL: --Stomach/Duodenum: Small hiatal hernia. --Small bowel: No dilatation or inflammation. --Colon: No focal abnormality. Proximal colon is poorly visualized due to underdistention and streak artifact from the patient's right arm. --Appendix: Not visualized. No right lower quadrant inflammation or free fluid. VASCULAR/LYMPHATIC: Atherosclerotic calcification is present within the non-aneurysmal abdominal aorta, without hemodynamically significant stenosis. The portal vein, splenic vein, superior mesenteric vein and IVC are patent. No abdominal or pelvic lymphadenopathy. REPRODUCTIVE: No pelvic or adnexal mass. MUSCULOSKELETAL. Chronic compression deformity at T11 and T12. OTHER: None. IMPRESSION: 1. Severely distended urinary bladder with severe bilateral hydroureteronephrosis. 2. Questionable functioning of Foley catheter.  Is there output? 3. Aortic Atherosclerosis (ICD10-I70.0). Electronically Signed   By: Deatra Robinson M.D.   On: 04/19/2018 00:41   Dg Chest Port 1 View  Result Date:  04/19/2018 CLINICAL DATA:  Hypoxia after vomiting. EXAM: PORTABLE CHEST 1 VIEW COMPARISON:  Radiograph yesterday. FINDINGS: The cardiomediastinal contours are normal. No new airspace disease since yesterday. Pulmonary vasculature is normal. No consolidation, pleural effusion, or pneumothorax. No acute osseous abnormalities are seen. IMPRESSION: No acute findings or change from radiograph yesterday. Electronically Signed   By: Narda Rutherford M.D.   On: 04/19/2018 01:09    EKG: Independently reviewed.  Vent. rate 119 BPM PR interval * ms QRS duration 68 ms QT/QTc 363/511 ms P-R-T axes 52 56 33 Sinus tachycardia Nonspecific repol abnormality, diffuse leads Prolonged QT interval  Assessment/Plan Principal Problem:   Sepsis secondary to UTI (HCC) Admit to telemetry/inpatient. Continue supplemental oxygen. Continue IV fluids. Monitor intake and output. Continue cefepime per pharmacy. Follow-up blood cultures and sensitivity. Follow-up urine culture and sensitivity.  Active Problems:   Hydroureteronephrosis May have to happen as a result of malfunctioning Foley catheter. The patient now has had significant urinary output after the catheter was changed.  Reevaluate with imaging in 48 to 72 hours to ensure resolution.    Hypothyroidism Continue levothyroxine 25 mcg p.o. daily. Check TSH as needed.    Dementia without behavioral disturbance (HCC) Continue Namenda 10 mg p.o. daily. Continue Aricept 5 mg p.o. at bedtime. Continue lorazepam 0.5 mg p.o. twice daily. Continue Zoloft 50 mg p.o. at bedtime. Continue Abilify 2 mg p.o. at bedtime.    Essential hypertension Monitor blood pressure.    Hyperlipidemia Should continue atorvastatin 10 mg p.o. daily    DVT prophylaxis: Heparin SQ. Code Status: Full code. Family Communication: Disposition Plan: Admit for IV antibiotic therapy for 2 to 3 days. Consults called: Admission status: Inpatient/telemetry.   Bobette Mo  MD [facility] Pager [phone].  If 7PM-7AM, please contact night-coverage [email or web address] Password [email or web address]  04/19/2018, 2:35 AM

## 2018-04-19 NOTE — Clinical Social Work Note (Signed)
Clinical Social Work Assessment  Patient Details  Name: Kimberly [name] MRN: [id number] Date of Birth: July 24, 1950  Date of referral:  04/19/18               Reason for consult:  Discharge Planning                Permission sought to share information with:    Permission granted to share information::     Name::        Agency::     Relationship::     Contact Information:     Housing/Transportation Living arrangements for the past 2 months:  Assisted Living Facility Source of Information:  Facility Patient Interpreter Needed:  None Criminal Activity/Legal Involvement Pertinent to Current Situation/Hospitalization:  No - Comment as needed Significant Relationships:  Adult Children Lives with:  Facility Resident Do you feel safe going back to the place where you live?  Yes Need for family participation in patient care:  No (Coment)  Care giving concerns: [facility] resides at [facility].   Social Worker assessment / plan: Pt is a 67 year old female [facility]. Contacted [facility] to update. DC timeframe not yet known. LCSW will follow and assist with dc planning needs.  Employment status:  Retired Health and safety inspectornsurance information:  Medicare PT Recommendations:  Not assessed at this time Information / Referral to community resources:     Patient/Family's Response to care: Pt and family accepting of care. Pt has dementia.  Patient/Family's Understanding of and Emotional Response to Diagnosis, Current Treatment, and Prognosis: Family has understanding of diagnosis and treatment recommendations. No emotional distress identified.  Emotional Assessment Appearance:  Appears stated age Attitude/Demeanor/Rapport:    Affect (typically observed):  Pleasant Orientation:  Oriented to Self Alcohol / Substance use:  Not Applicable Psych involvement (Current and /or in the community):  No (Comment)  Discharge Needs  Concerns to be addressed:  Discharge Planning Concerns Readmission  within the last 30 days:  No Current discharge risk:  None Barriers to Discharge:  No Barriers Identified   Elliot GaultKathleen Lynnsey Barbara, LCSW 04/19/2018, 11:39 AM

## 2018-04-20 ENCOUNTER — Inpatient Hospital Stay (HOSPITAL_COMMUNITY): Payer: Medicare Other

## 2018-04-20 DIAGNOSIS — N390 Urinary tract infection, site not specified: Secondary | ICD-10-CM | POA: Diagnosis not present

## 2018-04-20 DIAGNOSIS — A419 Sepsis, unspecified organism: Secondary | ICD-10-CM | POA: Diagnosis not present

## 2018-04-20 LAB — CBC WITH DIFFERENTIAL/PLATELET
Abs Immature Granulocytes: 0.06 10*3/uL (ref 0.00–0.07)
Basophils Absolute: 0 10*3/uL (ref 0.0–0.1)
Basophils Relative: 0 %
EOS ABS: 0.1 10*3/uL (ref 0.0–0.5)
EOS PCT: 1 %
HEMATOCRIT: 32.3 % — AB (ref 36.0–46.0)
Hemoglobin: 9.5 g/dL — ABNORMAL LOW (ref 12.0–15.0)
IMMATURE GRANULOCYTES: 1 %
LYMPHS ABS: 1.1 10*3/uL (ref 0.7–4.0)
Lymphocytes Relative: 9 %
MCH: 27.1 pg (ref 26.0–34.0)
MCHC: 29.4 g/dL — AB (ref 30.0–36.0)
MCV: 92 fL (ref 80.0–100.0)
MONOS PCT: 6 %
Monocytes Absolute: 0.8 10*3/uL (ref 0.1–1.0)
Neutro Abs: 9.8 10*3/uL — ABNORMAL HIGH (ref 1.7–7.7)
Neutrophils Relative %: 83 %
Platelets: 292 10*3/uL (ref 150–400)
RBC: 3.51 MIL/uL — ABNORMAL LOW (ref 3.87–5.11)
RDW: 14.9 % (ref 11.5–15.5)
WBC: 11.9 10*3/uL — ABNORMAL HIGH (ref 4.0–10.5)
nRBC: 0 % (ref 0.0–0.2)

## 2018-04-20 LAB — URINE CULTURE

## 2018-04-20 LAB — BASIC METABOLIC PANEL
ANION GAP: 4 — AB (ref 5–15)
BUN: 17 mg/dL (ref 8–23)
CALCIUM: 8.4 mg/dL — AB (ref 8.9–10.3)
CHLORIDE: 122 mmol/L — AB (ref 98–111)
CO2: 20 mmol/L — AB (ref 22–32)
Creatinine, Ser: 0.65 mg/dL (ref 0.44–1.00)
GFR calc Af Amer: >60 mL/min (ref >60)
GFR calc non Af Amer: >60 mL/min (ref >60)
GLUCOSE: 90 mg/dL (ref 70–99)
Potassium: 3.4 mmol/L — ABNORMAL LOW (ref 3.5–5.1)
Sodium: 146 mmol/L — ABNORMAL HIGH (ref 135–145)

## 2018-04-20 LAB — LACTIC ACID, PLASMA: Lactic Acid, Venous: 0.6 mmol/L (ref 0.5–1.9)

## 2018-04-20 MED ORDER — LEVALBUTEROL HCL 1.25 MG/0.5ML IN NEBU
1.2500 mg | INHALATION_SOLUTION | Freq: Once | RESPIRATORY_TRACT | Status: AC
  Administered 2018-04-20: 1.25 mg via RESPIRATORY_TRACT
  Filled 2018-04-20: qty 0.5

## 2018-04-20 MED ORDER — IOPAMIDOL (ISOVUE-300) INJECTION 61%
50.0000 mL | Freq: Once | INTRAVENOUS | Status: AC | PRN
  Administered 2018-04-20: 30 mL via ORAL

## 2018-04-20 MED ORDER — POTASSIUM CHLORIDE CRYS ER 20 MEQ PO TBCR
40.0000 meq | EXTENDED_RELEASE_TABLET | Freq: Once | ORAL | Status: AC
  Administered 2018-04-20: 40 meq via ORAL
  Filled 2018-04-20 (×2): qty 2

## 2018-04-20 MED ORDER — SODIUM CHLORIDE 0.9% FLUSH: 10.0000 mL | INTRAVENOUS | Status: DC | PRN

## 2018-04-20 MED ORDER — SODIUM CHLORIDE 0.9% FLUSH
10.0000 mL | Freq: Two times a day (BID) | INTRAVENOUS | Status: DC
  Administered 2018-04-21 – 2018-04-24 (×3): 10 mL

## 2018-04-20 MED ORDER — SODIUM CHLORIDE 0.9 % IN NEBU
INHALATION_SOLUTION | RESPIRATORY_TRACT | Status: AC
  Administered 2018-04-20: 3 mL
  Filled 2018-04-20: qty 3

## 2018-04-20 MED ORDER — IOPAMIDOL (ISOVUE-300) INJECTION 61%: 100.0000 mL | Freq: Once | INTRAVENOUS | Status: DC | PRN

## 2018-04-20 MED ORDER — SODIUM CHLORIDE 0.9 % IV BOLUS
500.0000 mL | Freq: Once | INTRAVENOUS | Status: AC
  Administered 2018-04-20: 500 mL via INTRAVENOUS

## 2018-04-20 NOTE — Progress Notes (Signed)
Patient noted to have right-sided expiratory wheezing, temp of 101. Notified mid-level MD with new orders for breathing treatments and NS bolus. Tylenol administered. Will re-check temp within the hr and continue to monitor.

## 2018-04-20 NOTE — Progress Notes (Signed)
PROGRESS NOTE    Kimberly [name]  ZOX:[id number] DOB: [birth year] DOA: 04/18/2018 PCP: Pearson GrippeKim, James, MD   Brief Narrative:  Per HPI from Dr. Robb Matarrtiz: Kimberly CaddySara L Collinsis a 67 y.o.femalewith medical history significant of gait abnormality, anemia, osteoarthritis, blindness, unspecified CHF, COPD, degenerative disc disease, depression, dysarthria, impaired hearing, hyperlipidemia, hypertension, hypothyroidism, dementia, history of pneumonia, chronic renal disease, right-sided hemiparesis, history of unspecified sepsis, history of CVA, tobacco abuse, history of urinary retention who was [facility] due to abdominal pain and explosive diarrhea earlier in the day. The history is limited due to the patient's history of dementia.  Patient was admitted for sepsis secondary to UTI related to malfunctioning chronic Foley catheter.  She was noted to have associated hydroureteronephrosis on CT imaging.  Foley placement resulted in significant urinary output.  She continues to have some clinical improvement and is more awake and alert today.  Urine cultures are still pending.  Continuing on IV cefepime for now with repeat CT abdomen ordered for today and pending.  She is DNR.   Assessment & Plan:   Principal Problem:   Sepsis secondary to UTI [facility](HCC) Active Problems:   Hypothyroidism   Tobacco abuse   Dementia without behavioral disturbance (HCC)   Essential hypertension   Hyperlipidemia   Hydroureteronephrosis   1. Sepsis secondary to UTI complicated with hydroureteronephrosis from Foley malfunction.  Continue IV cefepime and follow urine cultures as well as blood cultures which have remained negative at this time.  Repeat CT of the abdomen and pelvis today to reassess hydroureteronephrosis which should be improved as she is having good urine output with Foley replacement.  Consider urology consultation if needed.  Leukocytosis downtrending and lactic acid downtrending as  well. 2. Acute encephalopathy related to above.  Patient is improving from this regard and is more awake and alert.  Will start soft diet today. 3. Mild hypokalemia.  Replete and recheck labs in a.m. along with magnesium. 4. Hypothyroidism.  Continue levothyroxine 25 mcg p.o. daily.  TSH of 0.462 noted. 5. Dementia without behavioral disturbance.  Continue home medications which are multiple. 6. Essential hypertension.  Monitor blood pressure which has remained soft.  Continue gentle IV fluid infusion for now and avoid any antihypertensives. 7. Hyperlipidemia.  Continue atorvastatin 10 mg p.o. daily.   DVT prophylaxis: Heparin Code Status: DNR Family Communication: None at bedside Disposition Plan: Continue IV antibiotic therapy with cefepime until final urine cultures result; repeat CT abdomen and pelvis pending to reevaluate hydroureteronephrosis.  Return to ALF on discharge.   Consultants:   None  Procedures:   Repeat CT abdomen and pelvis pending today  Antimicrobials:   IV cefepime 11/25->   Subjective: Patient seen and evaluated today with no new acute complaints or concerns. No acute concerns or events noted overnight.  She appears to be more awake and alert and responsive to questioning this morning.  Denies any abdominal pain or concerns.  Objective: Vitals:   04/20/18 0600 04/20/18 0700 04/20/18 0900 04/20/18 1000  BP: (!) 89/46 (!) 95/54    Pulse: 61 68 84 77  Resp: (!) 21 20 (!) 22 20  Temp: 98.7 F (37.1 C) 98.7 F (37.1 C) 98.5 F (36.9 C) 98.7 F (37.1 C)  TempSrc:      SpO2: 97% 98% 99% 98%  Weight:      Height:        Intake/Output Summary (Last 24 hours) at 04/20/2018 1141 Last data filed at 04/20/2018 [id number] Gross  per 24 hour  Intake 1718.98 ml  Output 1100 ml  Net 618.98 ml   Filed Weights   04/19/18 0316 04/19/18 0854 04/20/18 0500  Weight: 50.5 kg 50.5 kg 50.7 kg    Examination:  General exam: Appears calm and comfortable  Respiratory  system: Clear to auscultation. Respiratory effort normal.  Currently on nasal cannula. Cardiovascular system: S1 & S2 heard, RRR. No JVD, murmurs, rubs, gallops or clicks. No pedal edema. Gastrointestinal system: Abdomen is nondistended, soft and nontender. No organomegaly or masses felt. Normal bowel sounds heard. Central nervous system: Alert, cannot complete rest of exam. Extremities: No lower extremity edema noted. Skin: No rashes, lesions or ulcers Psychiatry: Difficult to assess. Foley catheter with cloudy urine output noted.    Data Reviewed: I have personally reviewed following labs and imaging studies  CBC: Recent Labs  Lab 04/18/18 2245 04/18/18 2330 04/19/18 0502 04/20/18 0354  WBC 12.7*  --  14.4* 11.9*  NEUTROABS 10.3*  --  11.7* 9.8*  HGB 12.8 15.6* 11.9* 9.5*  HCT 42.6 46.0 39.0 32.3*  MCV 89.1  --  89.4 92.0  PLT 353  --  328 292   Basic Metabolic Panel: Recent Labs  Lab 04/18/18 2245 04/18/18 2330 04/19/18 0502 04/20/18 0354  NA 139 139 144 146*  K 3.9 4.4 3.6 3.4*  CL 107 107 116* 122*  CO2 22  --  19* 20*  GLUCOSE 135* 134* 126* 90  BUN 32* 42* 26* 17  CREATININE 1.47* 1.40* 1.06* 0.65  CALCIUM 9.5  --  8.6* 8.4*   GFR: Estimated Creatinine Clearance: 53.2 mL/min (by C-G formula based on SCr of 0.65 mg/dL). Liver Function Tests: Recent Labs  Lab 04/18/18 2245 04/19/18 0502  AST 21 21  ALT 13 12  ALKPHOS 61 53  BILITOT 0.5 0.4  PROT 7.6 6.8  ALBUMIN 3.6 3.1*   Recent Labs  Lab 04/18/18 2245  LIPASE 30   No results for input(s): AMMONIA in the last 168 hours. Coagulation Profile: Recent Labs  Lab 04/18/18 2245  INR 1.03   Cardiac Enzymes: Recent Labs  Lab 04/18/18 2245  TROPONINI <0.03   BNP (last 3 results) No results for input(s): PROBNP in the last 8760 hours. HbA1C: No results for input(s): HGBA1C in the last 72 hours. CBG: No results for input(s): GLUCAP in the last 168 hours. Lipid Profile: No results for  input(s): CHOL, HDL, LDLCALC, TRIG, CHOLHDL, LDLDIRECT in the last 72 hours. Thyroid Function Tests: Recent Labs    04/19/18 0516  TSH 0.462   Anemia Panel: No results for input(s): VITAMINB12, FOLATE, FERRITIN, TIBC, IRON, RETICCTPCT in the last 72 hours. Sepsis Labs: Recent Labs  Lab 04/18/18 2246 04/19/18 0224 04/19/18 0502 04/20/18 0354  LATICACIDVEN 2.4* 1.7 1.3 0.6    Recent Results (from the past 240 hour(s))  Urine culture     Status: None   Collection Time: 04/18/18  7:54 PM  Result Value Ref Range Status   Specimen Description   Final    URINE, CLEAN CATCH Performed at [facility], [address] [place], [place] [address]    Special Requests   Final    NONE Performed at [facility], [address] [place], [place] [address]    Culture   Final    Multiple bacterial morphotypes present, none predominant. Suggest appropriate recollection if clinically indicated.   Report Status 04/20/2018 FINAL  Final  Urine culture     Status: None   Collection Time: 04/19/18 12:48  AM  Result Value Ref Range Status   Specimen Description   Final    URINE, CLEAN CATCH Performed at [facility], [address], [place], [place] [address]    Special Requests   Final    NONE Performed at [facility], [address] [place], [place] [address]    Culture   Final    Multiple bacterial morphotypes present, none predominant. Suggest appropriate recollection if clinically indicated.   Report Status 04/20/2018 FINAL  Final  Culture, blood (x 2)     Status: None (Preliminary result)   Collection Time: 04/19/18  2:24 AM  Result Value Ref Range Status   Specimen Description BLOOD LEFT WRIST  Final   Special Requests   Final    BOTTLES DRAWN AEROBIC ONLY Blood Culture adequate volume   Culture  Setup Time   Final    AEROBIC BOTTLE ONLY NO ORGANISMS SEEN CULTURE REINCUBATED FOR BETTER GROWTH    Culture   Final    NO GROWTH 1 DAY Performed at [facility], [address],  [place], [place] [address]    Report Status PENDING  Incomplete  MRSA PCR Screening     Status: None   Collection Time: 04/19/18  3:08 AM  Result Value Ref Range Status   MRSA by PCR NEGATIVE NEGATIVE Final    Comment:        The GeneXpert MRSA Assay (FDA approved for NASAL specimens only), is one component of a comprehensive MRSA colonization surveillance program. It is not intended to diagnose MRSA infection nor to guide or monitor treatment for MRSA infections. Performed at [facility], [address], [place], [place] [address]   Culture, blood (x 2)     Status: None (Preliminary result)   Collection Time: 04/19/18  5:02 AM  Result Value Ref Range Status   Specimen Description BLOOD LEFT ARM  Final   Special Requests   Final    BOTTLES DRAWN AEROBIC AND ANAEROBIC Blood Culture adequate volume   Culture   Final    NO GROWTH 1 DAY Performed at [facility], [address], [place], [place] [address]    Report Status PENDING  Incomplete         Radiology Studies: Dg Chest 1 View  Result Date: 04/18/2018 CLINICAL DATA:  Diarrhea and abdominal pain. EXAM: CHEST  1 VIEW COMPARISON:  03/12/2018 FINDINGS: Upper normal heart size with prominent left epicardial fat pad. The lungs appear clear. Mediastinum unremarkable. No blunting of the costophrenic angles. IMPRESSION: 1.  No active cardiopulmonary disease is radiographically apparent. Electronically Signed   By: Gaylyn Rong M.D.   On: 04/18/2018 20:31   Ct Abdomen Pelvis W Contrast  Result Date: 04/19/2018 CLINICAL DATA:  Abdominal pain and diarrhea EXAM: CT ABDOMEN AND PELVIS WITH CONTRAST TECHNIQUE: Multidetector CT imaging of the abdomen and pelvis was performed using the standard protocol following bolus administration of intravenous contrast. CONTRAST:  80mL ISOVUE-300 IOPAMIDOL (ISOVUE-300) INJECTION 61% COMPARISON:  CT abdomen pelvis 12/29/2016 FINDINGS: LOWER CHEST: There is no basilar pleural or apical pericardial  effusion. HEPATOBILIARY: The hepatic contours and density are normal. There is no intra- or extrahepatic biliary dilatation. Status post cholecystectomy. PANCREAS: The pancreatic parenchymal contours are normal and there is no ductal dilatation. There is no peripancreatic fluid collection. SPLEEN: Normal. ADRENALS/URINARY TRACT: --Adrenal glands: Normal. --Right kidney/ureter: Severe hydroureter and moderate hydronephrosis. No nephrolithiasis or solid renal mass. --Left kidney/ureter: Severe hydroureter and moderate hydronephrosis. No nephrolithiasis or solid renal mass. --Urinary bladder:  The urinary bladder is severely distended. There is a Foley catheter within the bladder. STOMACH/BOWEL: --Stomach/Duodenum: Small hiatal hernia. --Small bowel: No dilatation or inflammation. --Colon: No focal abnormality. Proximal colon is poorly visualized due to underdistention and streak artifact from the patient's right arm. --Appendix: Not visualized. No right lower quadrant inflammation or free fluid. VASCULAR/LYMPHATIC: Atherosclerotic calcification is present within the non-aneurysmal abdominal aorta, without hemodynamically significant stenosis. The portal vein, splenic vein, superior mesenteric vein and IVC are patent. No abdominal or pelvic lymphadenopathy. REPRODUCTIVE: No pelvic or adnexal mass. MUSCULOSKELETAL. Chronic compression deformity at T11 and T12. OTHER: None. IMPRESSION: 1. Severely distended urinary bladder with severe bilateral hydroureteronephrosis. 2. Questionable functioning of Foley catheter.  Is there output? 3. Aortic Atherosclerosis (ICD10-I70.0). Electronically Signed   By: Deatra Robinson M.D.   On: 04/19/2018 00:41   Dg Chest Port 1 View  Result Date: 04/19/2018 CLINICAL DATA:  Hypoxia after vomiting. EXAM: PORTABLE CHEST 1 VIEW COMPARISON:  Radiograph yesterday. FINDINGS: The cardiomediastinal contours are normal. No new airspace disease since yesterday. Pulmonary vasculature is normal. No  consolidation, pleural effusion, or pneumothorax. No acute osseous abnormalities are seen. IMPRESSION: No acute findings or change from radiograph yesterday. Electronically Signed   By: Narda Rutherford M.D.   On: 04/19/2018 01:09        Scheduled Meds: . ARIPiprazole  2 mg Oral QHS  . atorvastatin  10 mg Oral QPM  . donepezil  5 mg Oral QHS  . heparin  5,000 Units Subcutaneous Q8H  . levothyroxine  25 mcg Oral Daily  . LORazepam  0.5 mg Oral BID  . memantine  10 mg Oral BID  . mirtazapine  7.5 mg Oral QHS  . sertraline  75 mg Oral QHS   Continuous Infusions: . sodium chloride 75 mL/hr at 04/20/18 0612  . ceFEPime (MAXIPIME) IV Stopped (04/19/18 2338)     LOS: 1 day    Time spent: 30 minutes    Kynlea Blackston Hoover Brunette, DO [facility] Pager [phone]  If 7PM-7AM, please contact night-coverage [email or web address] Password [facility] 04/20/2018, 11:41 AM

## 2018-04-21 DIAGNOSIS — A419 Sepsis, unspecified organism: Secondary | ICD-10-CM | POA: Diagnosis not present

## 2018-04-21 DIAGNOSIS — I10 Essential (primary) hypertension: Secondary | ICD-10-CM | POA: Diagnosis not present

## 2018-04-21 DIAGNOSIS — F0390 Unspecified dementia without behavioral disturbance: Secondary | ICD-10-CM | POA: Diagnosis not present

## 2018-04-21 DIAGNOSIS — N1330 Unspecified hydronephrosis: Secondary | ICD-10-CM | POA: Diagnosis not present

## 2018-04-21 LAB — CBC WITH DIFFERENTIAL/PLATELET
Abs Immature Granulocytes: 0.03 10*3/uL (ref 0.00–0.07)
BASOS PCT: 1 %
Basophils Absolute: 0 10*3/uL (ref 0.0–0.1)
EOS ABS: 0.1 10*3/uL (ref 0.0–0.5)
EOS PCT: 2 %
HEMATOCRIT: 34.1 % — AB (ref 36.0–46.0)
Hemoglobin: 9.9 g/dL — ABNORMAL LOW (ref 12.0–15.0)
Immature Granulocytes: 0 %
LYMPHS ABS: 1.3 10*3/uL (ref 0.7–4.0)
Lymphocytes Relative: 19 %
MCH: 27.7 pg (ref 26.0–34.0)
MCHC: 29 g/dL — AB (ref 30.0–36.0)
MCV: 95.5 fL (ref 80.0–100.0)
Monocytes Absolute: 0.6 10*3/uL (ref 0.1–1.0)
Monocytes Relative: 8 %
NRBC: 0 % (ref 0.0–0.2)
Neutro Abs: 4.7 10*3/uL (ref 1.7–7.7)
Neutrophils Relative %: 70 %
PLATELETS: 262 10*3/uL (ref 150–400)
RBC: 3.57 MIL/uL — AB (ref 3.87–5.11)
RDW: 14.4 % (ref 11.5–15.5)
WBC: 6.7 10*3/uL (ref 4.0–10.5)

## 2018-04-21 LAB — BASIC METABOLIC PANEL
Anion gap: 7 (ref 5–15)
BUN: 9 mg/dL (ref 8–23)
CALCIUM: 8.3 mg/dL — AB (ref 8.9–10.3)
CO2: 18 mmol/L — AB (ref 22–32)
CREATININE: 0.62 mg/dL (ref 0.44–1.00)
Chloride: 117 mmol/L — ABNORMAL HIGH (ref 98–111)
GFR calc Af Amer: >60 mL/min (ref >60)
Glucose, Bld: 90 mg/dL (ref 70–99)
Potassium: 3.4 mmol/L — ABNORMAL LOW (ref 3.5–5.1)
Sodium: 142 mmol/L (ref 135–145)

## 2018-04-21 LAB — MAGNESIUM: Magnesium: 1.9 mg/dL (ref 1.7–2.4)

## 2018-04-21 MED ORDER — POTASSIUM CHLORIDE CRYS ER 20 MEQ PO TBCR
40.0000 meq | EXTENDED_RELEASE_TABLET | Freq: Once | ORAL | Status: AC
  Administered 2018-04-21: 40 meq via ORAL
  Filled 2018-04-21: qty 2

## 2018-04-21 MED ORDER — SODIUM CHLORIDE 0.9 % IV SOLN
2.0000 g | Freq: Two times a day (BID) | INTRAVENOUS | Status: DC
  Administered 2018-04-21 (×2): 2 g via INTRAVENOUS
  Filled 2018-04-21 (×5): qty 2

## 2018-04-21 NOTE — Progress Notes (Signed)
Patient removed Urethral Catheter. Catheter intact no apparent bruising to genital area. MD informed.  Mitts in place.

## 2018-04-21 NOTE — Progress Notes (Signed)
PROGRESS NOTE                                                                                                                                                                                                             Patient Demographics:    Kimberly [name], is a 67 y.o. female, DOB - 01-23-1951, RUE:[id number]  Admit date - 04/18/2018   Admitting Physician Bobette Mo, MD  Outpatient Primary MD for the patient is Pearson Grippe, MD  LOS - 2  Outpatient Specialists: None  Chief Complaint  Patient presents with  . Abdominal Pain  . Diarrhea       Brief Narrative 67 year old female with multiple comorbidities including history of unspecified CHF, COPD, degenerative disc disease, depression, dysarthria, impaired hearing and legal blindness, anemia, gait abnormality history of CVA with right-sided hemiparesis, hypertension, hypothyroidism, tobacco abuse, diarrhea, urinary retention with chronic Foley was brought to the ED from assisted living abdominal pain and explosive diarrhea on the day of admission.  Patient was found to be septic with UTI and associated malfunctioning of chronic Foley catheter.  CT on admission showed hydroureteronephrosis.  Foley was exchanged in the ED with significant urinary output.  Patient placed on empiric antibiotic for sepsis secondary to UTI.   Subjective:   Patient febrile to 101 F last night.  Noted by nurse to be wheezy on exam.   Assessment  & Plan :    Principal Problem:   Sepsis secondary to UTI (HCC) Continue empiric cefepime.  Foley was changed in the ED.  Urine culture however is contaminated.  Blood cultures so far without any growth.  Prior urine culture have grown Klebsiella, Proteus (recently) and E. coli in the past all of which have been pansensitive. If no further fever, transition to cephalosporin tomorrow. WBC stable this morning and afebrile.  If has further fever spike  will obtain chest x-ray. Continue hydration for today.  Active Problems: Hydroureteronephrosis Likely due to malpositioned, nonfunctioning Foley catheter.  This was fixed in the ED and follow-up abdominal CT showed significant improvement in the hydronephrosis.  Monitor urine output.    Hypothyroidism Continue Synthroid     Dementia without behavioral disturbance (HCC) Has severe dementia possibly vascular from CVA.  Continue and Namenda  Depression Continue Abilify and Zoloft.  History of CVA/hyperlipidemia Continue statin.  Hypokalemia Replenished  Essential hypertension Stable.  Incidental finding of 3 separate hyperdense lesions in the right kidney Recommend non-urgent renal protocol MRI when patient able to tolerate breath-hold technique.  This can be done as outpatient if patient able to participate.    Code Status : DNR  Family Communication  : None at bedside  Disposition Plan  : To be return to ALF tomorrow if final cultures negative and no longer febrile.  Barriers For Discharge : Active symptoms  Consults  : None  Procedures  : CT abdomen and pelvis  DVT Prophylaxis  : Subcu heparin  Lab Results  Component Value Date   PLT 262 04/21/2018    Antibiotics  :  Anti-infectives (From admission, onward)   Start     Dose/Rate Route Frequency Ordered Stop   04/21/18 1200  ceFEPIme (MAXIPIME) 2 g in sodium chloride 0.9 % 100 mL IVPB     2 g 200 mL/hr over 30 Minutes Intravenous Every 12 hours 04/21/18 0920     04/20/18 0000  ceFEPIme (MAXIPIME) 2 g in sodium chloride 0.9 % 100 mL IVPB  Status:  Discontinued     2 g 200 mL/hr over 30 Minutes Intravenous Every 24 hours 04/19/18 0832 04/21/18 0920   04/19/18 0145  ceFEPIme (MAXIPIME) 2 g in sodium chloride 0.9 % 100 mL IVPB     2 g 200 mL/hr over 30 Minutes Intravenous  Once 04/19/18 0137 04/19/18 0301   04/19/18 0100  cefTRIAXone (ROCEPHIN) 1 g in sodium chloride 0.9 % 100 mL IVPB     1 g 200 mL/hr over  30 Minutes Intravenous  Once 04/19/18 0049 04/19/18 0145        Objective:   Vitals:   04/20/18 2151 04/20/18 2350 04/20/18 2352 04/21/18 0520  BP: 129/64   133/84  Pulse: (!) 103   89  Resp: 17   18  Temp: (!) 101 F (38.3 C) 99 F (37.2 C)  98.9 F (37.2 C)  TempSrc: Oral     SpO2: 92%  93% 94%  Weight:      Height:        Wt Readings from Last 3 Encounters:  04/20/18 50.7 kg  11/23/17 53.2 kg  08/29/17 63.5 kg     Intake/Output Summary (Last 24 hours) at 04/21/2018 1204 Last data filed at 04/21/2018 1000 Gross per 24 hour  Intake 2795.23 ml  Output 550 ml  Net 2245.23 ml     Physical Exam  Gen: Thin built female lying in bed, fatigued, confused HEENT: no pallor, moist mucosa, supple neck Chest: clear b/l, no added sounds CVS: N S1&S2, no murmurs, rubs or gallop GI: soft, NT, ND, BS+, Foley draining clear urine Musculoskeletal: warm, no edema CNS: AAOX 0, nonfocal    Data Review:    CBC Recent Labs  Lab 04/18/18 2245 04/18/18 2330 04/19/18 0502 04/20/18 0354 04/21/18 0457  WBC 12.7*  --  14.4* 11.9* 6.7  HGB 12.8 15.6* 11.9* 9.5* 9.9*  HCT 42.6 46.0 39.0 32.3* 34.1*  PLT 353  --  328 292 262  MCV 89.1  --  89.4 92.0 95.5  MCH 26.8  --  27.3 27.1 27.7  MCHC 30.0  --  30.5 29.4* 29.0*  RDW 14.5  --  14.6 14.9 14.4  LYMPHSABS 1.3  --  1.6 1.1 1.3  MONOABS 1.0  --  0.9 0.8 0.6  EOSABS 0.0  --  0.1 0.1 0.1  BASOSABS 0.0  --  0.0 0.0 0.0  Chemistries  Recent Labs  Lab 04/18/18 2245 04/18/18 2330 04/19/18 0502 04/20/18 0354 04/21/18 0457  NA 139 139 144 146* 142  K 3.9 4.4 3.6 3.4* 3.4*  CL 107 107 116* 122* 117*  CO2 22  --  19* 20* 18*  GLUCOSE 135* 134* 126* 90 90  BUN 32* 42* 26* 17 9  CREATININE 1.47* 1.40* 1.06* 0.65 0.62  CALCIUM 9.5  --  8.6* 8.4* 8.3*  MG  --   --   --   --  1.9  AST 21  --  21  --   --   ALT 13  --  12  --   --   ALKPHOS 61  --  53  --   --   BILITOT 0.5  --  0.4  --   --     ------------------------------------------------------------------------------------------------------------------ No results for input(s): CHOL, HDL, LDLCALC, TRIG, CHOLHDL, LDLDIRECT in the last 72 hours.  Lab Results  Component Value Date   HGBA1C 5.5 07/15/2016   ------------------------------------------------------------------------------------------------------------------ Recent Labs    04/19/18 0516  TSH 0.462   ------------------------------------------------------------------------------------------------------------------ No results for input(s): VITAMINB12, FOLATE, FERRITIN, TIBC, IRON, RETICCTPCT in the last 72 hours.  Coagulation profile Recent Labs  Lab 04/18/18 2245  INR 1.03    No results for input(s): DDIMER in the last 72 hours.  Cardiac Enzymes Recent Labs  Lab 04/18/18 2245  TROPONINI <0.03   ------------------------------------------------------------------------------------------------------------------ No results found for: BNP  Inpatient Medications  Scheduled Meds: . ARIPiprazole  2 mg Oral QHS  . atorvastatin  10 mg Oral QPM  . donepezil  5 mg Oral QHS  . heparin  5,000 Units Subcutaneous Q8H  . levothyroxine  25 mcg Oral Daily  . LORazepam  0.5 mg Oral BID  . memantine  10 mg Oral BID  . mirtazapine  7.5 mg Oral QHS  . sertraline  75 mg Oral QHS  . sodium chloride flush  10-40 mL Intracatheter Q12H   Continuous Infusions: . sodium chloride 75 mL/hr at 04/20/18 1925  . ceFEPime (MAXIPIME) IV     PRN Meds:.acetaminophen, acetaminophen, iopamidol, sodium chloride flush  Micro Results Recent Results (from the past 240 hour(s))  Urine culture     Status: None   Collection Time: 04/18/18  7:54 PM  Result Value Ref Range Status   Specimen Description   Final    URINE, CLEAN CATCH Performed at [facility], [address] [place], [place] [address]    Special Requests   Final    NONE Performed at [facility], [address] [place], [place] [address]    Culture   Final    Multiple bacterial morphotypes present, none predominant. Suggest appropriate recollection if clinically indicated.   Report Status 04/20/2018 FINAL  Final  Urine culture     Status: None   Collection Time: 04/19/18 12:48 AM  Result Value Ref Range Status   Specimen Description   Final    URINE, CLEAN CATCH Performed at [facility], [address] [place], [place] [address]    Special Requests   Final    NONE Performed at [facility], [address] [place], [place] [address]    Culture   Final    Multiple bacterial morphotypes present, none predominant. Suggest appropriate recollection if clinically indicated.   Report Status 04/20/2018 FINAL  Final  Culture, blood (x 2)     Status: None (Preliminary result)   Collection Time: 04/19/18  2:24 AM  Result Value Ref  Range Status   Specimen Description BLOOD LEFT WRIST  Final   Special Requests   Final    BOTTLES DRAWN AEROBIC ONLY Blood Culture adequate volume   Culture  Setup Time   Final    AEROBIC BOTTLE ONLY NO ORGANISMS SEEN CULTURE REINCUBATED FOR BETTER GROWTH    Culture   Final    NO GROWTH 2 DAYS Performed at [facility], [address] [place], [place] [address]    Report Status PENDING  Incomplete  MRSA PCR Screening     Status: None   Collection Time: 04/19/18  3:08 AM  Result Value Ref Range Status   MRSA by PCR NEGATIVE NEGATIVE Final    Comment:        The GeneXpert MRSA Assay (FDA approved for NASAL specimens only), is one component of a comprehensive MRSA colonization surveillance program. It is not intended to diagnose MRSA infection nor to guide or monitor treatment for MRSA infections. Performed at [facility], [address] [place], [place] [address]   Culture, blood (x 2)     Status: None (Preliminary result)   Collection Time: 04/19/18  5:02 AM  Result Value Ref Range Status   Specimen Description BLOOD LEFT ARM  Final   Special Requests    Final    BOTTLES DRAWN AEROBIC AND ANAEROBIC Blood Culture adequate volume   Culture   Final    NO GROWTH 2 DAYS Performed at [facility], [address] [place], [place] [address]    Report Status PENDING  Incomplete    Radiology Reports Ct Abdomen Pelvis Wo Contrast  Result Date: 04/20/2018 CLINICAL DATA:  Admission for sepsis secondary to UTI related to malfunctioning chronic Foley catheter. Re-evaluate hydroureter. Acute abdominal pain. EXAM: CT ABDOMEN AND PELVIS WITHOUT CONTRAST TECHNIQUE: Multidetector CT imaging of the abdomen and pelvis was performed following the standard protocol without IV contrast. COMPARISON:  Contrast-enhanced CT 2 days ago FINDINGS: Patient motion artifact. In conjunction with the lack of IV contrast, evaluation of solid viscera is limited. Lower chest: Tiny bilateral pleural effusions with adjacent atelectasis, new from prior exam. Heart is normal in size. Hepatobiliary: No focal hepatic lesion. Calcified gallstone without pericholecystic inflammation. No biliary dilatation. Pancreas: Partially motion obscured, no evidence pancreatic inflammation. Spleen: Normal in size without focal abnormality. Adrenals/Urinary Tract: No adrenal nodule. Improved bilateral hydroureteronephrosis with residual prominence of the right renal pelvis. Nonobstructing stones in the right kidney largest measuring 7 mm in the interpolar region. Mild right renal cortical scarring. There 3 hyperdense lesions in the left kidney, largest in the lower pole measuring 11 mm. Simple cyst in the upper left kidney is partially motion obscured. Foley catheter in the urinary bladder with improved bladder distension from prior. Layering hyperdensity in the right urinary trigone suspicious for bladder stones, less likely wall calcifications. Mild perivesicular edema and bladder wall thickening persists. Stomach/Bowel: Stomach partially distended. No bowel wall thickening, obstruction or inflammatory change.  Enteric contrast throughout the colon. Sigmoid colonic redundancy. Cecum is located in the mid abdomen, contrast filled appendix is normal located centrally. Distal colonic diverticulosis without diverticulitis. Vascular/Lymphatic: Aortic atherosclerosis and tortuosity. No bulky abdominopelvic adenopathy. Reproductive: Possible 13 mm right adnexal cyst, difficult to delineate due to adjacent bowel. Uterus and left adnexa are unremarkable. Other: No free air free fluid. Musculoskeletal: Chronic compression fractures of T11 and T12, unchanged. Scoliosis with multilevel degenerative change in the spine. No acute osseous abnormalities. IMPRESSION: 1. Improved urinary bladder distention and bilateral hydroureteronephrosis from exam 2  days ago. Persistent prominence of the right renal pelvis. Persistent but improved bladder wall thickening and perivesicular edema. 2. Nonobstructing right renal stones. Probable stones at the right urinary trigone, less likely bladder wall calcifications. 3. Three separate hyperdense lesions in the right kidney. These likely represent hyperdense cysts but are incompletely characterized. Recommend nonemergent renal protocol MRI when patient is able to tolerate breath hold technique. 4. Tiny bilateral pleural effusions with adjacent atelectasis. 5. Incidental findings of colonic diverticulosis and gallstones. Aortic Atherosclerosis (ICD10-I70.0). Electronically Signed   By: Narda Rutherford M.D.   On: 04/20/2018 21:13   Dg Chest 1 View  Result Date: 04/18/2018 CLINICAL DATA:  Diarrhea and abdominal pain. EXAM: CHEST  1 VIEW COMPARISON:  03/12/2018 FINDINGS: Upper normal heart size with prominent left epicardial fat pad. The lungs appear clear. Mediastinum unremarkable. No blunting of the costophrenic angles. IMPRESSION: 1.  No active cardiopulmonary disease is radiographically apparent. Electronically Signed   By: Gaylyn Rong M.D.   On: 04/18/2018 20:31   Ct Abdomen Pelvis W  Contrast  Result Date: 04/19/2018 CLINICAL DATA:  Abdominal pain and diarrhea EXAM: CT ABDOMEN AND PELVIS WITH CONTRAST TECHNIQUE: Multidetector CT imaging of the abdomen and pelvis was performed using the standard protocol following bolus administration of intravenous contrast. CONTRAST:  80mL ISOVUE-300 IOPAMIDOL (ISOVUE-300) INJECTION 61% COMPARISON:  CT abdomen pelvis 12/29/2016 FINDINGS: LOWER CHEST: There is no basilar pleural or apical pericardial effusion. HEPATOBILIARY: The hepatic contours and density are normal. There is no intra- or extrahepatic biliary dilatation. Status post cholecystectomy. PANCREAS: The pancreatic parenchymal contours are normal and there is no ductal dilatation. There is no peripancreatic fluid collection. SPLEEN: Normal. ADRENALS/URINARY TRACT: --Adrenal glands: Normal. --Right kidney/ureter: Severe hydroureter and moderate hydronephrosis. No nephrolithiasis or solid renal mass. --Left kidney/ureter: Severe hydroureter and moderate hydronephrosis. No nephrolithiasis or solid renal mass. --Urinary bladder: The urinary bladder is severely distended. There is a Foley catheter within the bladder. STOMACH/BOWEL: --Stomach/Duodenum: Small hiatal hernia. --Small bowel: No dilatation or inflammation. --Colon: No focal abnormality. Proximal colon is poorly visualized due to underdistention and streak artifact from the patient's right arm. --Appendix: Not visualized. No right lower quadrant inflammation or free fluid. VASCULAR/LYMPHATIC: Atherosclerotic calcification is present within the non-aneurysmal abdominal aorta, without hemodynamically significant stenosis. The portal vein, splenic vein, superior mesenteric vein and IVC are patent. No abdominal or pelvic lymphadenopathy. REPRODUCTIVE: No pelvic or adnexal mass. MUSCULOSKELETAL. Chronic compression deformity at T11 and T12. OTHER: None. IMPRESSION: 1. Severely distended urinary bladder with severe bilateral hydroureteronephrosis.  2. Questionable functioning of Foley catheter.  Is there output? 3. Aortic Atherosclerosis (ICD10-I70.0). Electronically Signed   By: Deatra Robinson M.D.   On: 04/19/2018 00:41   Dg Chest Port 1 View  Result Date: 04/19/2018 CLINICAL DATA:  Hypoxia after vomiting. EXAM: PORTABLE CHEST 1 VIEW COMPARISON:  Radiograph yesterday. FINDINGS: The cardiomediastinal contours are normal. No new airspace disease since yesterday. Pulmonary vasculature is normal. No consolidation, pleural effusion, or pneumothorax. No acute osseous abnormalities are seen. IMPRESSION: No acute findings or change from radiograph yesterday. Electronically Signed   By: Narda Rutherford M.D.   On: 04/19/2018 01:09    Time Spent in minutes  25   Eliab Closson M.D on 04/21/2018 at 12:04 PM  Between 7am to 7pm - Pager - [phone]  After 7pm go to [email or web address] - password [facility]  [facility] -  Office  [phone]

## 2018-04-22 DIAGNOSIS — F0390 Unspecified dementia without behavioral disturbance: Secondary | ICD-10-CM

## 2018-04-22 DIAGNOSIS — N1330 Unspecified hydronephrosis: Secondary | ICD-10-CM | POA: Diagnosis not present

## 2018-04-22 DIAGNOSIS — E43 Unspecified severe protein-calorie malnutrition: Secondary | ICD-10-CM

## 2018-04-22 DIAGNOSIS — A419 Sepsis, unspecified organism: Secondary | ICD-10-CM | POA: Diagnosis not present

## 2018-04-22 MED ORDER — CEPHALEXIN 500 MG PO CAPS
500.0000 mg | ORAL_CAPSULE | Freq: Three times a day (TID) | ORAL | Status: DC
  Administered 2018-04-22 – 2018-04-24 (×8): 500 mg via ORAL
  Filled 2018-04-22: qty 2
  Filled 2018-04-22 (×7): qty 1

## 2018-04-22 MED ORDER — POTASSIUM CHLORIDE CRYS ER 20 MEQ PO TBCR
40.0000 meq | EXTENDED_RELEASE_TABLET | Freq: Once | ORAL | Status: AC
  Administered 2018-04-22: 40 meq via ORAL
  Filled 2018-04-22: qty 2

## 2018-04-22 NOTE — Progress Notes (Signed)
PROGRESS NOTE                                                                                                                                                                                                             Patient Demographics:    Kimberly [name], is a 67 y.o. female, DOB - 1950/06/18, ZOX:[id number]  Admit date - 04/18/2018   Admitting Physician Bobette Moavid Manuel Ortiz, MD  Outpatient Primary MD for the patient is Pearson GrippeKim, James, MD  LOS - 3  Outpatient Specialists: None  Chief Complaint  Patient presents with  . Abdominal Pain  . Diarrhea       Brief Narrative 67 year old female with multiple comorbidities including history of unspecified CHF, COPD, degenerative disc disease, depression, dysarthria, impaired hearing and legal blindness, anemia, gait abnormality history of CVA with right-sided hemiparesis, hypertension, hypothyroidism, tobacco abuse, diarrhea, urinary retention with chronic Foley was brought to the ED from assisted living abdominal pain and explosive diarrhea on the day of admission.  Patient was found to be septic with UTI and associated malfunctioning of chronic Foley catheter.  CT on admission showed hydroureteronephrosis.  Foley was exchanged in the ED with significant urinary output.  Patient placed on empiric antibiotic for sepsis secondary to UTI.  Sepsis physiology resolved.  Antibiotic de-escalated to Keflex by mouth on 04/22/2018.   Subjective:  No major events overnight.  Patient is not a reliable historian.  She is awake but oriented only to self. Does not appear to be in distress.  Remains afebrile.   Assessment  & Plan :  Sepsis secondary to UTI/chronic Foley [facility](HCC): Sepsis physiology resolved.  Foley change in ED. urine blood cultures negative.  Fever curve downtrending.  Last fever 11/26.  Previously with pansensitive Klebsiella, Proteus and E. coli UTI.  -Ceftriaxone 11/25 -Cefepime 11/25  through 11/28 -Keflex 11/28-->  Hydroureteronephrosis: Likely due to Foley malfunction.  Replaced in ED.  Repeat CT with significant improvement. -Monitor urine output -Consider bethanechol. -Foley in place.  Hypothyroidism -Continue Synthroid  Advanced dementia without behavioral disturbance (HCC) -Delirium precaution -Continue and Namenda  Depression -Continue Abilify and Zoloft.  History of CVA/hyperlipidemia -Continue statin.  Hypokalemia -Replenished  Essential hypertension -Stable.  Incidental finding of 3 separate hyperdense lesions in the right kidney -non-urgent outpatient renal protocol MRI when patient able to tolerate breath-hold technique.  However, given  patient's comorbidity and quality of life, I really doubt the utility of these.  Severe protein calorie malnutrition -Nutrition consult  Code Status : DNR  Family Communication  : None at bedside  Disposition Plan  : Anticipate discharge 11/29 if she remains stable on oral antibiotic.  PT and OT evaluation pending.  Barriers For Discharge : Active symptoms  Consults  : None  Procedures  : CT abdomen and pelvis  DVT Prophylaxis  : Subcu heparin  Lab Results  Component Value Date   PLT 262 04/21/2018    Antibiotics  :  Anti-infectives (From admission, onward)   Start     Dose/Rate Route Frequency Ordered Stop   04/22/18 0730  cephALEXin (KEFLEX) capsule 500 mg     500 mg Oral Every 8 hours 04/22/18 0719     04/21/18 1200  ceFEPIme (MAXIPIME) 2 g in sodium chloride 0.9 % 100 mL IVPB  Status:  Discontinued     2 g 200 mL/hr over 30 Minutes Intravenous Every 12 hours 04/21/18 0920 04/22/18 0719   04/20/18 0000  ceFEPIme (MAXIPIME) 2 g in sodium chloride 0.9 % 100 mL IVPB  Status:  Discontinued     2 g 200 mL/hr over 30 Minutes Intravenous Every 24 hours 04/19/18 0832 04/21/18 0920   04/19/18 0145  ceFEPIme (MAXIPIME) 2 g in sodium chloride 0.9 % 100 mL IVPB     2 g 200 mL/hr over 30 Minutes  Intravenous  Once 04/19/18 0137 04/19/18 0301   04/19/18 0100  cefTRIAXone (ROCEPHIN) 1 g in sodium chloride 0.9 % 100 mL IVPB     1 g 200 mL/hr over 30 Minutes Intravenous  Once 04/19/18 0049 04/19/18 0145        Objective:   Vitals:   04/21/18 0520 04/21/18 1611 04/21/18 2104 04/22/18 0537  BP: 133/84 127/72 (!) 142/97 138/75  Pulse: 89 88 (!) 103 84  Resp: 18 18 16 20   Temp: 98.9 F (37.2 C)  97.6 F (36.4 C) 98.1 F (36.7 C)  TempSrc:   Oral Oral  SpO2: 94% 94% 92% 92%  Weight:      Height:        Wt Readings from Last 3 Encounters:  04/20/18 50.7 kg  11/23/17 53.2 kg  08/29/17 63.5 kg     Intake/Output Summary (Last 24 hours) at 04/22/2018 1110 Last data filed at 04/22/2018 0900 Gross per 24 hour  Intake 1292.79 ml  Output 1175 ml  Net 117.79 ml     Physical Exam GENERAL: Appears frail.  No acute distress. HEENT: Hearing grossly intact.  Visually impaired. NECK: Supple.  LUNGS:  No IWOB.  Fair air movement. HEART:  RRR. Heart sounds normal. ABD: Bowel sounds present. Soft. Non tender. EXT:   no edema bilaterally. SKIN: no apparent skin lesion. NEURO: Awake and alert.  Oriented only to self. PSYCH: Calm.   Data Review:    CBC Recent Labs  Lab 04/18/18 2245 04/18/18 2330 04/19/18 0502 04/20/18 0354 04/21/18 0457  WBC 12.7*  --  14.4* 11.9* 6.7  HGB 12.8 15.6* 11.9* 9.5* 9.9*  HCT 42.6 46.0 39.0 32.3* 34.1*  PLT 353  --  328 292 262  MCV 89.1  --  89.4 92.0 95.5  MCH 26.8  --  27.3 27.1 27.7  MCHC 30.0  --  30.5 29.4* 29.0*  RDW 14.5  --  14.6 14.9 14.4  LYMPHSABS 1.3  --  1.6 1.1 1.3  MONOABS 1.0  --  0.9 0.8 0.6  EOSABS 0.0  --  0.1 0.1 0.1  BASOSABS 0.0  --  0.0 0.0 0.0    Chemistries  Recent Labs  Lab 04/18/18 2245 04/18/18 2330 04/19/18 0502 04/20/18 0354 04/21/18 0457  NA 139 139 144 146* 142  K 3.9 4.4 3.6 3.4* 3.4*  CL 107 107 116* 122* 117*  CO2 22  --  19* 20* 18*  GLUCOSE 135* 134* 126* 90 90  BUN 32* 42* 26* 17 9    CREATININE 1.47* 1.40* 1.06* 0.65 0.62  CALCIUM 9.5  --  8.6* 8.4* 8.3*  MG  --   --   --   --  1.9  AST 21  --  21  --   --   ALT 13  --  12  --   --   ALKPHOS 61  --  53  --   --   BILITOT 0.5  --  0.4  --   --    ------------------------------------------------------------------------------------------------------------------ No results for input(s): CHOL, HDL, LDLCALC, TRIG, CHOLHDL, LDLDIRECT in the last 72 hours.  Lab Results  Component Value Date   HGBA1C 5.5 07/15/2016   ------------------------------------------------------------------------------------------------------------------ No results for input(s): TSH, T4TOTAL, T3FREE, THYROIDAB in the last 72 hours.  Invalid input(s): FREET3 ------------------------------------------------------------------------------------------------------------------ No results for input(s): VITAMINB12, FOLATE, FERRITIN, TIBC, IRON, RETICCTPCT in the last 72 hours.  Coagulation profile Recent Labs  Lab 04/18/18 2245  INR 1.03    No results for input(s): DDIMER in the last 72 hours.  Cardiac Enzymes Recent Labs  Lab 04/18/18 2245  TROPONINI <0.03   ------------------------------------------------------------------------------------------------------------------ No results found for: BNP  Inpatient Medications  Scheduled Meds: . ARIPiprazole  2 mg Oral QHS  . atorvastatin  10 mg Oral QPM  . cephALEXin  500 mg Oral Q8H  . donepezil  5 mg Oral QHS  . heparin  5,000 Units Subcutaneous Q8H  . levothyroxine  25 mcg Oral Daily  . LORazepam  0.5 mg Oral BID  . memantine  10 mg Oral BID  . mirtazapine  7.5 mg Oral QHS  . sertraline  75 mg Oral QHS  . sodium chloride flush  10-40 mL Intracatheter Q12H   Continuous Infusions: . sodium chloride 75 mL/hr at 04/22/18 0917   PRN Meds:.acetaminophen, acetaminophen, iopamidol, sodium chloride flush  Micro Results Recent Results (from the past 240 hour(s))  Urine culture      Status: None   Collection Time: 04/18/18  7:54 PM  Result Value Ref Range Status   Specimen Description   Final    URINE, CLEAN CATCH Performed at [facility], [address] [place], [place] [address]    Special Requests   Final    NONE Performed at [facility], [address] [place], [place] [address]    Culture   Final    Multiple bacterial morphotypes present, none predominant. Suggest appropriate recollection if clinically indicated.   Report Status 04/20/2018 FINAL  Final  Urine culture     Status: None   Collection Time: 04/19/18 12:48 AM  Result Value Ref Range Status   Specimen Description   Final    URINE, CLEAN CATCH Performed at [facility], [address] [place], [place] [address]    Special Requests   Final    NONE Performed at [facility], [address] [place], [place] [address]    Culture   Final    Multiple bacterial morphotypes present, none predominant. Suggest appropriate recollection if clinically indicated.   Report Status 04/20/2018 FINAL  Final  Culture, blood (x 2)     Status: None (Preliminary result)   Collection Time: 04/19/18  2:24 AM  Result Value Ref Range Status   Specimen Description BLOOD LEFT WRIST  Final   Special Requests   Final    BOTTLES DRAWN AEROBIC ONLY Blood Culture adequate volume   Culture  Setup Time   Final    AEROBIC BOTTLE ONLY NO ORGANISMS SEEN CULTURE REINCUBATED FOR BETTER GROWTH    Culture   Final    NO GROWTH 3 DAYS Performed at [facility], [address], [place], [place] [address]    Report Status PENDING  Incomplete  MRSA PCR Screening     Status: None   Collection Time: 04/19/18  3:08 AM  Result Value Ref Range Status   MRSA by PCR NEGATIVE NEGATIVE Final    Comment:        The GeneXpert MRSA Assay (FDA approved for NASAL specimens only), is one component of a comprehensive MRSA colonization surveillance program. It is not intended to diagnose MRSA infection nor to guide or monitor treatment  for MRSA infections. Performed at [facility], [address] [place], [place] [address]   Culture, blood (x 2)     Status: None (Preliminary result)   Collection Time: 04/19/18  5:02 AM  Result Value Ref Range Status   Specimen Description BLOOD LEFT ARM  Final   Special Requests   Final    BOTTLES DRAWN AEROBIC AND ANAEROBIC Blood Culture adequate volume   Culture   Final    NO GROWTH 3 DAYS Performed at [facility], [address] [place], [place] [address]    Report Status PENDING  Incomplete    Radiology Reports Ct Abdomen Pelvis Wo Contrast  Result Date: 04/20/2018 CLINICAL DATA:  Admission for sepsis secondary to UTI related to malfunctioning chronic Foley catheter. Re-evaluate hydroureter. Acute abdominal pain. EXAM: CT ABDOMEN AND PELVIS WITHOUT CONTRAST TECHNIQUE: Multidetector CT imaging of the abdomen and pelvis was performed following the standard protocol without IV contrast. COMPARISON:  Contrast-enhanced CT 2 days ago FINDINGS: Patient motion artifact. In conjunction with the lack of IV contrast, evaluation of solid viscera is limited. Lower chest: Tiny bilateral pleural effusions with adjacent atelectasis, new from prior exam. Heart is normal in size. Hepatobiliary: No focal hepatic lesion. Calcified gallstone without pericholecystic inflammation. No biliary dilatation. Pancreas: Partially motion obscured, no evidence pancreatic inflammation. Spleen: Normal in size without focal abnormality. Adrenals/Urinary Tract: No adrenal nodule. Improved bilateral hydroureteronephrosis with residual prominence of the right renal pelvis. Nonobstructing stones in the right kidney largest measuring 7 mm in the interpolar region. Mild right renal cortical scarring. There 3 hyperdense lesions in the left kidney, largest in the lower pole measuring 11 mm. Simple cyst in the upper left kidney is partially motion obscured. Foley catheter in the urinary bladder with improved bladder distension  from prior. Layering hyperdensity in the right urinary trigone suspicious for bladder stones, less likely wall calcifications. Mild perivesicular edema and bladder wall thickening persists. Stomach/Bowel: Stomach partially distended. No bowel wall thickening, obstruction or inflammatory change. Enteric contrast throughout the colon. Sigmoid colonic redundancy. Cecum is located in the mid abdomen, contrast filled appendix is normal located centrally. Distal colonic diverticulosis without diverticulitis. Vascular/Lymphatic: Aortic atherosclerosis and tortuosity. No bulky abdominopelvic adenopathy. Reproductive: Possible 13 mm right adnexal cyst, difficult to delineate due to adjacent bowel. Uterus and left adnexa are unremarkable. Other: No free air free fluid. Musculoskeletal: Chronic compression fractures of T11 and  T12, unchanged. Scoliosis with multilevel degenerative change in the spine. No acute osseous abnormalities. IMPRESSION: 1. Improved urinary bladder distention and bilateral hydroureteronephrosis from exam 2 days ago. Persistent prominence of the right renal pelvis. Persistent but improved bladder wall thickening and perivesicular edema. 2. Nonobstructing right renal stones. Probable stones at the right urinary trigone, less likely bladder wall calcifications. 3. Three separate hyperdense lesions in the right kidney. These likely represent hyperdense cysts but are incompletely characterized. Recommend nonemergent renal protocol MRI when patient is able to tolerate breath hold technique. 4. Tiny bilateral pleural effusions with adjacent atelectasis. 5. Incidental findings of colonic diverticulosis and gallstones. Aortic Atherosclerosis (ICD10-I70.0). Electronically Signed   By: Narda Rutherford M.D.   On: 04/20/2018 21:13   Dg Chest 1 View  Result Date: 04/18/2018 CLINICAL DATA:  Diarrhea and abdominal pain. EXAM: CHEST  1 VIEW COMPARISON:  03/12/2018 FINDINGS: Upper normal heart size with prominent  left epicardial fat pad. The lungs appear clear. Mediastinum unremarkable. No blunting of the costophrenic angles. IMPRESSION: 1.  No active cardiopulmonary disease is radiographically apparent. Electronically Signed   By: Gaylyn Rong M.D.   On: 04/18/2018 20:31   Ct Abdomen Pelvis W Contrast  Result Date: 04/19/2018 CLINICAL DATA:  Abdominal pain and diarrhea EXAM: CT ABDOMEN AND PELVIS WITH CONTRAST TECHNIQUE: Multidetector CT imaging of the abdomen and pelvis was performed using the standard protocol following bolus administration of intravenous contrast. CONTRAST:  80mL ISOVUE-300 IOPAMIDOL (ISOVUE-300) INJECTION 61% COMPARISON:  CT abdomen pelvis 12/29/2016 FINDINGS: LOWER CHEST: There is no basilar pleural or apical pericardial effusion. HEPATOBILIARY: The hepatic contours and density are normal. There is no intra- or extrahepatic biliary dilatation. Status post cholecystectomy. PANCREAS: The pancreatic parenchymal contours are normal and there is no ductal dilatation. There is no peripancreatic fluid collection. SPLEEN: Normal. ADRENALS/URINARY TRACT: --Adrenal glands: Normal. --Right kidney/ureter: Severe hydroureter and moderate hydronephrosis. No nephrolithiasis or solid renal mass. --Left kidney/ureter: Severe hydroureter and moderate hydronephrosis. No nephrolithiasis or solid renal mass. --Urinary bladder: The urinary bladder is severely distended. There is a Foley catheter within the bladder. STOMACH/BOWEL: --Stomach/Duodenum: Small hiatal hernia. --Small bowel: No dilatation or inflammation. --Colon: No focal abnormality. Proximal colon is poorly visualized due to underdistention and streak artifact from the patient's right arm. --Appendix: Not visualized. No right lower quadrant inflammation or free fluid. VASCULAR/LYMPHATIC: Atherosclerotic calcification is present within the non-aneurysmal abdominal aorta, without hemodynamically significant stenosis. The portal vein, splenic vein,  superior mesenteric vein and IVC are patent. No abdominal or pelvic lymphadenopathy. REPRODUCTIVE: No pelvic or adnexal mass. MUSCULOSKELETAL. Chronic compression deformity at T11 and T12. OTHER: None. IMPRESSION: 1. Severely distended urinary bladder with severe bilateral hydroureteronephrosis. 2. Questionable functioning of Foley catheter.  Is there output? 3. Aortic Atherosclerosis (ICD10-I70.0). Electronically Signed   By: Deatra Robinson M.D.   On: 04/19/2018 00:41   Dg Chest Port 1 View  Result Date: 04/19/2018 CLINICAL DATA:  Hypoxia after vomiting. EXAM: PORTABLE CHEST 1 VIEW COMPARISON:  Radiograph yesterday. FINDINGS: The cardiomediastinal contours are normal. No new airspace disease since yesterday. Pulmonary vasculature is normal. No consolidation, pleural effusion, or pneumothorax. No acute osseous abnormalities are seen. IMPRESSION: No acute findings or change from radiograph yesterday. Electronically Signed   By: Narda Rutherford M.D.   On: 04/19/2018 01:09    Time Spent in minutes  25  [facility] Pager [phone]  If 7PM-7AM, please contact night-coverage [email or web address] Password [email or web address] 04/22/2018, 11:22 AM

## 2018-04-23 DIAGNOSIS — E872 Acidosis: Secondary | ICD-10-CM | POA: Diagnosis not present

## 2018-04-23 DIAGNOSIS — E43 Unspecified severe protein-calorie malnutrition: Secondary | ICD-10-CM | POA: Diagnosis not present

## 2018-04-23 DIAGNOSIS — N1330 Unspecified hydronephrosis: Secondary | ICD-10-CM | POA: Diagnosis not present

## 2018-04-23 DIAGNOSIS — A419 Sepsis, unspecified organism: Secondary | ICD-10-CM | POA: Diagnosis not present

## 2018-04-23 DIAGNOSIS — E876 Hypokalemia: Secondary | ICD-10-CM

## 2018-04-23 MED ORDER — ADULT MULTIVITAMIN LIQUID CH
15.0000 mL | Freq: Every day | ORAL | Status: DC
  Administered 2018-04-23 – 2018-04-24 (×2): 15 mL via ORAL
  Filled 2018-04-23 (×4): qty 15

## 2018-04-23 MED ORDER — POTASSIUM CHLORIDE 20 MEQ PO PACK
40.0000 meq | PACK | Freq: Once | ORAL | Status: AC
  Administered 2018-04-23: 40 meq via ORAL
  Filled 2018-04-23: qty 2

## 2018-04-23 MED ORDER — ENSURE ENLIVE PO LIQD
237.0000 mL | Freq: Two times a day (BID) | ORAL | Status: DC
  Administered 2018-04-23 – 2018-04-24 (×3): 237 mL via ORAL

## 2018-04-23 NOTE — Clinical Social Work Note (Signed)
LCSW spoke with [name] at [facility] and discussed PT and OT evals. [name] stated that patient can return to the facility at discharge.  Willard Farquharson, Juleen ChinaHeather D, LCSW

## 2018-04-23 NOTE — Progress Notes (Signed)
Palliative Medicine RN Note: Consult order noted.  PMT [facility] Tuesday, Dec [facility], and the patient will be evaluated and seen then if she remains admitted. If she is discharged before then, we recommend outpatient palliative care (will need this in the d/c orders and summary).  If recommendations are needed in the interim, please call our office at [phone].  Margret ChanceMelanie G. Hung Rhinesmith, RN, BSN, [facility] Palliative Medicine Team 04/23/2018 3:56 PM Office [phone]

## 2018-04-23 NOTE — Evaluation (Signed)
Physical Therapy Evaluation Patient Details Name: Kimberly [name] MRN: [id number] DOB: Aug 27, 1950 Today's Date: 04/23/2018   History of Present Illness  Kimberly [name] is a 67 y.o. female with medical history significant of gait abnormality, anemia, osteoarthritis, blindness, unspecified CHF, COPD, degenerative disc disease, depression, dysarthria, impaired hearing, hyperlipidemia, hypertension, hypothyroidism, dementia, history of pneumonia, chronic renal disease, right-sided hemiparesis, history of unspecified sepsis, history of CVA, tobacco abuse, history of urinary retention who was brought [facility] due to abdominal pain and explosive diarrhea earlier in the day. The history is limited due to the patient's history of dementia.   Clinical Impression  Patient presents in bed upon arrival. Patient soiled. Informed nursing. PT assisted nursing in hygiene of patient. Patient able to respond to directions for bed mobility to assist in hygiene. Patient able to perform bed mobility with supervision to modified independent. Transfers with min assist of two hands held. Patient HOH and with visual impairment. Upon standing, patient reluctant to release bed rail for ambulation in unfamiliar environment and unfamiliar adult.  Patient would benefit from return to ALF for care. Patient would continue to benefit from skilled physical therapy in current environment and next venue to continue return to prior function and increase strength, endurance, balance, coordination, and functional mobility and gait skills.      Follow Up Recommendations SNF;Supervision for mobility/OOB;Supervision/Assistance - 24 hour    Equipment Recommendations  None recommended by PT    Recommendations for Other Services       Precautions / Restrictions Precautions Precautions: Fall Precaution Comments: Due to blindness and HOH Restrictions Weight Bearing Restrictions: No      Mobility  Bed  Mobility Overal bed mobility: Needs Assistance Bed Mobility: Supine to Sit;Sit to Supine;Rolling Rolling: Modified independent (Device/Increase time)   Supine to sit: Min assist Sit to supine: Min assist      Transfers Overall transfer level: Needs assistance Equipment used: None Transfers: Sit to/from Stand           General transfer comment: Patient was able to complete a sit to stand and stand to sit with 2 hands held. She required physical and verbal cues due to visual deficits and new environment.   Ambulation/Gait                Stairs            Wheelchair Mobility    Modified Rankin (Stroke Patients Only)       Balance Overall balance assessment: Needs assistance Sitting-balance support: Bilateral upper extremity supported;Feet supported Sitting balance-Leahy Scale: Good     Standing balance support: Bilateral upper extremity supported;During functional activity Standing balance-Leahy Scale: Fair Standing balance comment: new environment; visual impairment                             Pertinent Vitals/Pain Pain Assessment: No/denies pain    Home Living Family/patient expects to be discharged to:: Skilled nursing facility                 Additional Comments: Patient is a resident at a SNF. Per patient she receives assistance with all basic ADL tasks. She reports she uses a RW for ambulation. Due to history of dementia unsure if information is correct.     Prior Function Level of Independence: Needs assistance   Gait / Transfers Assistance Needed: Pt reports she ambulates with RW. Due to history of dementia, unsure if information  is correct.  ADL's / Homemaking Assistance Needed: Pt indicates she requires assistance with all ADLs at ALF.         Hand Dominance   Dominant Hand: Right    Extremity/Trunk Assessment   Upper Extremity Assessment Upper Extremity Assessment: Generalized weakness    Lower Extremity  Assessment Lower Extremity Assessment: Generalized weakness    Cervical / Trunk Assessment Cervical / Trunk Assessment: Kyphotic  Communication   Communication: HOH  Cognition Arousal/Alertness: Awake/alert Behavior During Therapy: WFL for tasks assessed/performed Overall Cognitive Status: History of cognitive impairments - at baseline                                        General Comments      Exercises     Assessment/Plan    PT Assessment Patient needs continued PT services  PT Problem List Decreased strength;Decreased mobility;Decreased activity tolerance;Decreased balance;Decreased cognition       PT Treatment Interventions DME instruction;Therapeutic activities;Gait training;Therapeutic exercise;Patient/family education;Balance training;Neuromuscular re-education;Functional mobility training    PT Goals (Current goals can be found in the Care Plan section)  Acute Rehab PT Goals Patient Stated Goal: return to ALF. PT Goal Formulation: With patient Time For Goal Achievement: 04/30/18 Potential to Achieve Goals: Good    Frequency Min 2X/week   Barriers to discharge        Co-evaluation               AM-PAC PT "6 Clicks" Mobility  Outcome Measure Help needed turning from your back to your side while in a flat bed without using bedrails?: None Help needed moving from lying on your back to sitting on the side of a flat bed without using bedrails?: A Little Help needed moving to and from a bed to a chair (including a wheelchair)?: A Lot Help needed standing up from a chair using your arms (e.g., wheelchair or bedside chair)?: A Little Help needed to walk in hospital room?: A Lot Help needed climbing 3-5 steps with a railing? : A Lot 6 Click Score: 16    End of Session   Activity Tolerance: Patient tolerated treatment well Patient left: in bed;with call bell/phone within reach Nurse Communication: Mobility status PT Visit Diagnosis:  Unsteadiness on feet (R26.81);Muscle weakness (generalized) (M62.81);Other abnormalities of gait and mobility (R26.89)    Time: 1310-1340 PT Time Calculation (min) (ACUTE ONLY): 30 min   Charges:   PT Evaluation $PT Eval Moderate Complexity: 1 Mod PT Treatments $Therapeutic Activity: 8-22 mins        Katina Dung. Hartnett-Rands, MS, PT [facility] PT [facility] #[id number] 04/23/2018, 1:49 PM

## 2018-04-23 NOTE — Plan of Care (Signed)
  Problem: Acute Rehab PT Goals(only PT should resolve) Goal: Pt will Roll Supine to Side Outcome: Progressing Flowsheets (Taken 04/23/2018 1354) Pt will Roll Supine to Side: with modified independence Goal: Pt Will Go Supine/Side To Sit Outcome: Progressing Flowsheets (Taken 04/23/2018 1354) Pt will go Supine/Side to Sit: with modified independence Goal: Pt Will Go Sit To Supine/Side Outcome: Progressing Flowsheets (Taken 04/23/2018 1354) Pt will go Sit to Supine/Side: with modified independence Goal: Patient Will Transfer Sit To/From Stand Outcome: Progressing Flowsheets (Taken 04/23/2018 1354) Patient will transfer sit to/from stand: with minimal assist Goal: Pt Will Transfer Bed To Chair/Chair To Bed Outcome: Progressing Flowsheets (Taken 04/23/2018 1354) Pt will Transfer Bed to Chair/Chair to Bed: with min assist Goal: Pt Will Ambulate Outcome: Progressing Flowsheets (Taken 04/23/2018 1354) Pt will Ambulate: 10 feet; with least restrictive assistive device; with minimal assist; with moderate assist Goal: Pt/caregiver will Perform Home Exercise Program Outcome: Progressing Flowsheets (Taken 04/23/2018 1354) Pt/caregiver will Perform Home Exercise Program: For increased strengthening; For improved balance; With minimal assist   Katina DungBarbara D. Hartnett-Rands, MS, PT [facility] PT [facility] [phone] 04/23/2018

## 2018-04-23 NOTE — Evaluation (Signed)
Occupational Therapy Evaluation Patient Details Name: Kimberly [name] MRN: [id number] DOB: 1951-01-02 Today's Date: 04/23/2018    History of Present Illness Kimberly [name] is a 67 y.o. female with medical history significant of gait abnormality, anemia, osteoarthritis, blindness, unspecified CHF, COPD, degenerative disc disease, depression, dysarthria, impaired hearing, hyperlipidemia, hypertension, hypothyroidism, dementia, history of pneumonia, chronic renal disease, right-sided hemiparesis, history of unspecified sepsis, history of CVA, tobacco abuse, history of urinary retention who was brought from [facility] [place] [facility] due to abdominal pain and explosive diarrhea earlier in the day. The history is limited due to the patient's history of dementia   Clinical Impression   Patient in bed upon therapy arrival. Very pleasant and cooperative. Requires verbal and physical cues due to visual impairments. Patient currently receives assistance for all ADL tasks at facility from which she came (ALF/SNF). Pt appears to be at baseline and recommend that she return. Staff OT may evaluate her upon return to determine if she requires any additional services. At this time, all OT needs have been met. Will sign off.     Follow Up Recommendations  Supervision/Assistance - 24 hour;SNF;Other (comment)(Return to facility SNF/ALF)    Equipment Recommendations  None recommended by OT       Precautions / Restrictions Precautions Precautions: Fall Precaution Comments: Due to blindness and HOH Restrictions Weight Bearing Restrictions: No      Mobility Bed Mobility Overal bed mobility: Needs Assistance Bed Mobility: Supine to Sit;Sit to Supine     Supine to sit: Mod assist;HOB elevated Sit to supine: Mod assist      Transfers                 General transfer comment: Patient was able to complete a sit to stand and stand to sit using RW with Min Assist. She required physical and verbal cues  due to visual deficits and new environment. Patient was able to take 3 side steps to the right towards the [facility] while using the RW. Min Assist just for safety.         ADL either performed or assessed with clinical judgement   ADL Overall ADL's : At baseline     Grooming: Wash/dry face;Set up;Bed level               Lower Body Dressing: Total assistance;Bed level               Functional mobility during ADLs: Minimal assistance;Rolling walker       Vision Baseline Vision/History: Legally blind Patient Visual Report: No change from baseline              Pertinent Vitals/Pain Pain Assessment: No/denies pain     Hand Dominance Right   Extremity/Trunk Assessment Upper Extremity Assessment Upper Extremity Assessment: Overall WFL for tasks assessed   Lower Extremity Assessment Lower Extremity Assessment: Defer to PT evaluation       Communication Communication Communication: HOH   Cognition Arousal/Alertness: Awake/alert Behavior During Therapy: WFL for tasks assessed/performed Overall Cognitive Status: History of cognitive impairments - at baseline                  Home Living Family/patient expects to be discharged to:: Skilled nursing facility       Additional Comments: Patient is a resident at a SNF. Per patient she receives assistance with all basic ADL tasks. She reports she uses a RW for ambulation. Due to history of dementia unsure if information is correct.  Prior Functioning/Environment Level of Independence: Needs assistance                                Barriers to D/C:  none             AM-PAC OT "6 Clicks" Daily Activity     Outcome Measure Help from another person eating meals?: A Lot Help from another person taking care of personal grooming?: A Lot Help from another person toileting, which includes using toliet, bedpan, or urinal?: A Lot Help from another person bathing (including washing, rinsing,  drying)?: A Lot Help from another person to put on and taking off regular upper body clothing?: A Lot Help from another person to put on and taking off regular lower body clothing?: A Lot 6 Click Score: 12   End of Session Equipment Utilized During Treatment: Gait belt;Rolling walker  Activity Tolerance: Patient tolerated treatment well Patient left: in bed;with call bell/phone within reach;with bed alarm set  OT Visit Diagnosis: Muscle weakness (generalized) (M62.81)                Time: 4599-7741 OT Time Calculation (min): 18 min Charges:  OT General Charges $OT Visit: 1 Visit OT Evaluation $OT Eval Low Complexity: 1 Low Ailene Ravel, OTR/L,CBIS  [phone]   Hamsini Verrilli, Clarene Duke 04/23/2018, 11:14 AM

## 2018-04-23 NOTE — Progress Notes (Signed)
PROGRESS NOTE                                                                                                                                                                                                             Patient Demographics:    Kimberly [name], is a 67 y.o. female, DOB - 10-06-1950, ZOX:[id number]  Admit date - 04/18/2018   Admitting Physician Bobette Mo, MD  Outpatient Primary MD for the patient is Pearson Grippe, MD  LOS - 4  Outpatient Specialists: None  Chief Complaint  Patient presents with  . Abdominal Pain  . Diarrhea       Brief Narrative 67 year old female with multiple comorbidities including history of unspecified CHF, COPD, degenerative disc disease, depression, dysarthria, impaired hearing and legal blindness, anemia, gait abnormality history of CVA with right-sided hemiparesis, hypertension, hypothyroidism, tobacco abuse, diarrhea, urinary retention with chronic Foley was brought to the ED from assisted living abdominal pain and explosive diarrhea on the day of admission.  Patient was found to be septic with UTI and associated malfunctioning of chronic Foley catheter.  CT on admission showed hydroureteronephrosis.  Foley was exchanged in the ED with significant urinary output.  Patient placed on empiric antibiotic for sepsis secondary to UTI.  Sepsis physiology resolved.  Antibiotic de-escalated to Keflex by mouth on 04/22/2018.   Subjective:  No major events overnight.  Patient is not a reliable historian.  She is awake but oriented only to self. Does not appear to be in distress.  Remains afebrile.   Assessment  & Plan :  Sepsis secondary to UTI/chronic Foley [facility]): Sepsis physiology resolved.  Foley change in ED. Urine blood cultures negative.  Fever curve downtrending.  Last fever 11/26.  Previously with pansensitive Klebsiella, Proteus and E. coli UTI.  -Ceftriaxone 11/25 -Cefepime 11/25  through 11/28 -Keflex 11/28--> -Palliative consult  Hydroureteronephrosis: Likely due to Foley malfunction.  Replaced in ED.  Repeat CT with significant improvement. -Monitor urine output -Consider bethanechol. -Continue Foley  Hypothyroidism -Continue Synthroid  Advanced dementia without behavioral disturbance (HCC) -Delirium precaution -Continue  Namenda-but I doubt the utility of this at this stage.  Follow-up yes  Depression -Continue Abilify and Zoloft.  History of CVA/hyperlipidemia -Continue statin-doubt the utility of this at this stage.  Hypokalemia -Replenish and recheck  Non-anion gap metabolic acidosis: Likely due to IV  normal saline -KVO  Essential hypertension -Stable.  Incidental finding of 3 separate hyperdense lesions in the right kidney -non-urgent outpatient renal protocol MRI when patient able to tolerate breath-hold technique.  However, given patient's comorbidity and quality of life, I really doubt the utility of these.  Severe protein calorie malnutrition -Nutrition consult-appreciate input  Code Status : DNR  Family Communication: Attempted to call daughter multiple times.  Did not leave voicemail.   Disposition Plan: I favor SNF versus ALF given here condition.  Will involve palliative care  Barriers For Discharge: Patient agrees no palliative performance score and could qualify for hospice.  Unfortunately, I was not able to reach family to discuss about this.  Palliative care consulted.  Consults  : None  Procedures  : CT abdomen and pelvis  DVT Prophylaxis  : Subcu heparin  Lab Results  Component Value Date   PLT 262 04/21/2018    Antibiotics  :  Anti-infectives (From admission, onward)   Start     Dose/Rate Route Frequency Ordered Stop   04/22/18 0730  cephALEXin (KEFLEX) capsule 500 mg     500 mg Oral Every 8 hours 04/22/18 0719     04/21/18 1200  ceFEPIme (MAXIPIME) 2 g in sodium chloride 0.9 % 100 mL IVPB  Status:   Discontinued     2 g 200 mL/hr over 30 Minutes Intravenous Every 12 hours 04/21/18 0920 04/22/18 0719   04/20/18 0000  ceFEPIme (MAXIPIME) 2 g in sodium chloride 0.9 % 100 mL IVPB  Status:  Discontinued     2 g 200 mL/hr over 30 Minutes Intravenous Every 24 hours 04/19/18 0832 04/21/18 0920   04/19/18 0145  ceFEPIme (MAXIPIME) 2 g in sodium chloride 0.9 % 100 mL IVPB     2 g 200 mL/hr over 30 Minutes Intravenous  Once 04/19/18 0137 04/19/18 0301   04/19/18 0100  cefTRIAXone (ROCEPHIN) 1 g in sodium chloride 0.9 % 100 mL IVPB     1 g 200 mL/hr over 30 Minutes Intravenous  Once 04/19/18 0049 04/19/18 0145        Objective:   Vitals:   04/22/18 1953 04/22/18 2102 04/23/18 0537 04/23/18 1231  BP:  113/66 116/67   Pulse:  66 72   Resp:  19    Temp:  98.2 F (36.8 C) 98.9 F (37.2 C)   TempSrc:  Oral Oral   SpO2: 92% 95% 92%   Weight:    49.6 kg  Height:        Wt Readings from Last 3 Encounters:  04/23/18 49.6 kg  11/23/17 53.2 kg  08/29/17 63.5 kg     Intake/Output Summary (Last 24 hours) at 04/23/2018 1316 Last data filed at 04/23/2018 1100 Gross per 24 hour  Intake 1384.82 ml  Output 3302 ml  Net -1917.18 ml     Physical Exam GENERAL: Appears frail.  No acute distress. HEENT: Hearing grossly intact.  Visually impaired. NECK: Supple.  LUNGS:  No IWOB.  Fair air movement bilaterally HEART:  RRR. Heart sounds normal. ABD: Bowel sounds present. Soft. Non tender. EXT:   no edema bilaterally. SKIN: no apparent skin lesion. NEURO: Awake and alert.  Oriented only to self.  Further exam limited due to patient's inability to cooperate PSYCH: Calm.   Data Review:    CBC Recent Labs  Lab 04/18/18 2245 04/18/18 2330 04/19/18 0502 04/20/18 0354 04/21/18 0457  WBC 12.7*  --  14.4* 11.9* 6.7  HGB 12.8 15.6* 11.9* 9.5* 9.9*  HCT 42.6 46.0 39.0 32.3* 34.1*  PLT 353  --  328 292 262  MCV 89.1  --  89.4 92.0 95.5  MCH 26.8  --  27.3 27.1 27.7  MCHC 30.0  --  30.5  29.4* 29.0*  RDW 14.5  --  14.6 14.9 14.4  LYMPHSABS 1.3  --  1.6 1.1 1.3  MONOABS 1.0  --  0.9 0.8 0.6  EOSABS 0.0  --  0.1 0.1 0.1  BASOSABS 0.0  --  0.0 0.0 0.0    Chemistries  Recent Labs  Lab 04/18/18 2245 04/18/18 2330 04/19/18 0502 04/20/18 0354 04/21/18 0457  NA 139 139 144 146* 142  K 3.9 4.4 3.6 3.4* 3.4*  CL 107 107 116* 122* 117*  CO2 22  --  19* 20* 18*  GLUCOSE 135* 134* 126* 90 90  BUN 32* 42* 26* 17 9  CREATININE 1.47* 1.40* 1.06* 0.65 0.62  CALCIUM 9.5  --  8.6* 8.4* 8.3*  MG  --   --   --   --  1.9  AST 21  --  21  --   --   ALT 13  --  12  --   --   ALKPHOS 61  --  53  --   --   BILITOT 0.5  --  0.4  --   --    ------------------------------------------------------------------------------------------------------------------ No results for input(s): CHOL, HDL, LDLCALC, TRIG, CHOLHDL, LDLDIRECT in the last 72 hours.  Lab Results  Component Value Date   HGBA1C 5.5 07/15/2016   ------------------------------------------------------------------------------------------------------------------ No results for input(s): TSH, T4TOTAL, T3FREE, THYROIDAB in the last 72 hours.  Invalid input(s): FREET3 ------------------------------------------------------------------------------------------------------------------ No results for input(s): VITAMINB12, FOLATE, FERRITIN, TIBC, IRON, RETICCTPCT in the last 72 hours.  Coagulation profile Recent Labs  Lab 04/18/18 2245  INR 1.03    No results for input(s): DDIMER in the last 72 hours.  Cardiac Enzymes Recent Labs  Lab 04/18/18 2245  TROPONINI <0.03   ------------------------------------------------------------------------------------------------------------------ No results found for: BNP  Inpatient Medications  Scheduled Meds: . ARIPiprazole  2 mg Oral QHS  . atorvastatin  10 mg Oral QPM  . cephALEXin  500 mg Oral Q8H  . donepezil  5 mg Oral QHS  . feeding supplement (ENSURE ENLIVE)  237 mL Oral  BID BM  . heparin  5,000 Units Subcutaneous Q8H  . levothyroxine  25 mcg Oral Daily  . LORazepam  0.5 mg Oral BID  . memantine  10 mg Oral BID  . mirtazapine  7.5 mg Oral QHS  . multivitamin  15 mL Oral Daily  . sertraline  75 mg Oral QHS  . sodium chloride flush  10-40 mL Intracatheter Q12H   Continuous Infusions: . sodium chloride 10 mL/hr at 04/23/18 0800   PRN Meds:.acetaminophen, acetaminophen, iopamidol, sodium chloride flush  Micro Results Recent Results (from the past 240 hour(s))  Urine culture     Status: None   Collection Time: 04/18/18  7:54 PM  Result Value Ref Range Status   Specimen Description   Final    URINE, CLEAN CATCH Performed at [facility], [address], [place], [place] [address]    Special Requests   Final    NONE Performed at [facility], [address] [place], [place] [address]    Culture   Final    Multiple bacterial morphotypes present, none predominant. Suggest appropriate recollection if clinically indicated.   Report Status 04/20/2018 FINAL  Final  Urine culture     Status:  None   Collection Time: 04/19/18 12:48 AM  Result Value Ref Range Status   Specimen Description   Final    URINE, CLEAN CATCH Performed at [facility], [address], [place], [place] [address]    Special Requests   Final    NONE Performed at [facility], [address], [place], [place] [address]    Culture   Final    Multiple bacterial morphotypes present, none predominant. Suggest appropriate recollection if clinically indicated.   Report Status 04/20/2018 FINAL  Final  Culture, blood (x 2)     Status: None (Preliminary result)   Collection Time: 04/19/18  2:24 AM  Result Value Ref Range Status   Specimen Description BLOOD LEFT WRIST  Final   Special Requests   Final    BOTTLES DRAWN AEROBIC ONLY Blood Culture adequate volume   Culture  Setup Time   Final    AEROBIC BOTTLE ONLY NO ORGANISMS SEEN CULTURE REINCUBATED FOR BETTER GROWTH    Culture   Final     NO GROWTH 4 DAYS Performed at [facility], [address] [place], [place] [address]    Report Status PENDING  Incomplete  MRSA PCR Screening     Status: None   Collection Time: 04/19/18  3:08 AM  Result Value Ref Range Status   MRSA by PCR NEGATIVE NEGATIVE Final    Comment:        The GeneXpert MRSA Assay (FDA approved for NASAL specimens only), is one component of a comprehensive MRSA colonization surveillance program. It is not intended to diagnose MRSA infection nor to guide or monitor treatment for MRSA infections. Performed at [facility], [address], [place], [place] [address]   Culture, blood (x 2)     Status: None (Preliminary result)   Collection Time: 04/19/18  5:02 AM  Result Value Ref Range Status   Specimen Description BLOOD LEFT ARM  Final   Special Requests   Final    BOTTLES DRAWN AEROBIC AND ANAEROBIC Blood Culture adequate volume   Culture   Final    NO GROWTH 4 DAYS Performed at [facility], [address], [place], [place] [address]    Report Status PENDING  Incomplete    Radiology Reports Ct Abdomen Pelvis Wo Contrast  Result Date: 04/20/2018 CLINICAL DATA:  Admission for sepsis secondary to UTI related to malfunctioning chronic Foley catheter. Re-evaluate hydroureter. Acute abdominal pain. EXAM: CT ABDOMEN AND PELVIS WITHOUT CONTRAST TECHNIQUE: Multidetector CT imaging of the abdomen and pelvis was performed following the standard protocol without IV contrast. COMPARISON:  Contrast-enhanced CT 2 days ago FINDINGS: Patient motion artifact. In conjunction with the lack of IV contrast, evaluation of solid viscera is limited. Lower chest: Tiny bilateral pleural effusions with adjacent atelectasis, new from prior exam. Heart is normal in size. Hepatobiliary: No focal hepatic lesion. Calcified gallstone without pericholecystic inflammation. No biliary dilatation. Pancreas: Partially motion obscured, no evidence pancreatic inflammation. Spleen: Normal in  size without focal abnormality. Adrenals/Urinary Tract: No adrenal nodule. Improved bilateral hydroureteronephrosis with residual prominence of the right renal pelvis. Nonobstructing stones in the right kidney largest measuring 7 mm in the interpolar region. Mild right renal cortical scarring. There 3 hyperdense lesions in the left kidney, largest in the lower pole measuring 11 mm. Simple cyst in the upper left kidney is partially motion obscured. Foley catheter in the urinary bladder with improved bladder distension from prior. Layering hyperdensity in the right urinary trigone suspicious for bladder stones, less likely wall  calcifications. Mild perivesicular edema and bladder wall thickening persists. Stomach/Bowel: Stomach partially distended. No bowel wall thickening, obstruction or inflammatory change. Enteric contrast throughout the colon. Sigmoid colonic redundancy. Cecum is located in the mid abdomen, contrast filled appendix is normal located centrally. Distal colonic diverticulosis without diverticulitis. Vascular/Lymphatic: Aortic atherosclerosis and tortuosity. No bulky abdominopelvic adenopathy. Reproductive: Possible 13 mm right adnexal cyst, difficult to delineate due to adjacent bowel. Uterus and left adnexa are unremarkable. Other: No free air free fluid. Musculoskeletal: Chronic compression fractures of T11 and T12, unchanged. Scoliosis with multilevel degenerative change in the spine. No acute osseous abnormalities. IMPRESSION: 1. Improved urinary bladder distention and bilateral hydroureteronephrosis from exam 2 days ago. Persistent prominence of the right renal pelvis. Persistent but improved bladder wall thickening and perivesicular edema. 2. Nonobstructing right renal stones. Probable stones at the right urinary trigone, less likely bladder wall calcifications. 3. Three separate hyperdense lesions in the right kidney. These likely represent hyperdense cysts but are incompletely characterized.  Recommend nonemergent renal protocol MRI when patient is able to tolerate breath hold technique. 4. Tiny bilateral pleural effusions with adjacent atelectasis. 5. Incidental findings of colonic diverticulosis and gallstones. Aortic Atherosclerosis (ICD10-I70.0). Electronically Signed   By: Narda RutherfordMelanie  Sanford M.D.   On: 04/20/2018 21:13   Dg Chest 1 View  Result Date: 04/18/2018 CLINICAL DATA:  Diarrhea and abdominal pain. EXAM: CHEST  1 VIEW COMPARISON:  03/12/2018 FINDINGS: Upper normal heart size with prominent left epicardial fat pad. The lungs appear clear. Mediastinum unremarkable. No blunting of the costophrenic angles. IMPRESSION: 1.  No active cardiopulmonary disease is radiographically apparent. Electronically Signed   By: Gaylyn RongWalter  Liebkemann M.D.   On: 04/18/2018 20:31   Ct Abdomen Pelvis W Contrast  Result Date: 04/19/2018 CLINICAL DATA:  Abdominal pain and diarrhea EXAM: CT ABDOMEN AND PELVIS WITH CONTRAST TECHNIQUE: Multidetector CT imaging of the abdomen and pelvis was performed using the standard protocol following bolus administration of intravenous contrast. CONTRAST:  80mL ISOVUE-300 IOPAMIDOL (ISOVUE-300) INJECTION 61% COMPARISON:  CT abdomen pelvis 12/29/2016 FINDINGS: LOWER CHEST: There is no basilar pleural or apical pericardial effusion. HEPATOBILIARY: The hepatic contours and density are normal. There is no intra- or extrahepatic biliary dilatation. Status post cholecystectomy. PANCREAS: The pancreatic parenchymal contours are normal and there is no ductal dilatation. There is no peripancreatic fluid collection. SPLEEN: Normal. ADRENALS/URINARY TRACT: --Adrenal glands: Normal. --Right kidney/ureter: Severe hydroureter and moderate hydronephrosis. No nephrolithiasis or solid renal mass. --Left kidney/ureter: Severe hydroureter and moderate hydronephrosis. No nephrolithiasis or solid renal mass. --Urinary bladder: The urinary bladder is severely distended. There is a Foley catheter  within the bladder. STOMACH/BOWEL: --Stomach/Duodenum: Small hiatal hernia. --Small bowel: No dilatation or inflammation. --Colon: No focal abnormality. Proximal colon is poorly visualized due to underdistention and streak artifact from the patient's right arm. --Appendix: Not visualized. No right lower quadrant inflammation or free fluid. VASCULAR/LYMPHATIC: Atherosclerotic calcification is present within the non-aneurysmal abdominal aorta, without hemodynamically significant stenosis. The portal vein, splenic vein, superior mesenteric vein and IVC are patent. No abdominal or pelvic lymphadenopathy. REPRODUCTIVE: No pelvic or adnexal mass. MUSCULOSKELETAL. Chronic compression deformity at T11 and T12. OTHER: None. IMPRESSION: 1. Severely distended urinary bladder with severe bilateral hydroureteronephrosis. 2. Questionable functioning of Foley catheter.  Is there output? 3. Aortic Atherosclerosis (ICD10-I70.0). Electronically Signed   By: Deatra RobinsonKevin  Herman M.D.   On: 04/19/2018 00:41   Dg Chest Port 1 View  Result Date: 04/19/2018 CLINICAL DATA:  Hypoxia after vomiting. EXAM: PORTABLE CHEST 1 VIEW COMPARISON:  Radiograph yesterday. FINDINGS: The cardiomediastinal contours are normal. No new airspace disease since yesterday. Pulmonary vasculature is normal. No consolidation, pleural effusion, or pneumothorax. No acute osseous abnormalities are seen. IMPRESSION: No acute findings or change from radiograph yesterday. Electronically Signed   By: Narda Rutherford M.D.   On: 04/19/2018 01:09    Time Spent in minutes  25  Mallika Sanmiguel T[facility] Pager [phone]  If 7PM-7AM, please contact night-coverage [email or web address] Password [email or web address] 04/23/2018, 1:16 PM

## 2018-04-23 NOTE — Care Management Important Message (Signed)
Important Message  Patient Details  Name: Kimberly [name] MRN: [id number] Date of Birth: [birth year]   Medicare Important Message Given:  Yes  Pt could not sign due to medical issues. Gave patient a paper copy of the IM letter.  Sloan LeiterHawkins, Brandonlee Navis Smith 04/23/2018, 11:29 AM

## 2018-04-23 NOTE — Progress Notes (Addendum)
Initial Nutrition Assessment  DOCUMENTATION CODES:  Severe malnutrition in context of acute illness/injury  INTERVENTION:  Continue Ensure Enlive po BID, each supplement provides 350 kcal and 20 grams of protein  Liquid MVI with minerals  Appreciate nursing assistance at meals  NUTRITION DIAGNOSIS:  Severe Malnutrition(Acute) related to acute illness(Sepsis r/t UTI) as evidenced by an energy intake that has met >/= to 50% of needs for >/= to 5 days and wasting of fat stores to moderate degree  GOAL:  Patient will meet greater than or equal to 90% of their needs  MONITOR:  PO intake, Supplement acceptance, Diet advancement, Labs, Weight trends  REASON FOR ASSESSMENT:  Consult Assessment of nutrition requirement/status  ASSESSMENT:  67 y/o female PMHx CHF, COPD, DJD, Depression, Dementia,  Impaired HOH, CKD, Legal blindness, anemia, htn/hld, tobacco abuse, Chronic foley, CVA w/ residual R-side hemiparesis and dysarthria. Presented from ALF w/ abdominal pain and explosive diarrhea  x1 day. Dx w/ sepsis 2/2 UTI.and admitted for management.   Unfortunately, little information is available regarding pt's intake hx. Pt is impaired and cannot converse, only offering 1 word responses. NT notes she has been eating poorly since admit. He says the best the patient has eaten is 1/3rd of a meal yesterday, but normally she eats less than this.  Per nursing meal documentation, the patient has not eaten >25% of a meal since admitted. NT says pt does fair with Ensure. He does report that pt pockets food sometimes, but seems to be able to tolerate the soft diet relatively well. She is unable to feed herself and is total assist at meals.   Noted she Is on remeron w/ dosing consistent for appetite stimulation ->suggests patient eating poorly at baseline, though she apparently has been on this for well over a year.   Bed wt today is 109.4 lbs. Her long term wt history is difficult to decipher as  measurements are extremely inconsistent from encounter to encounter; her current wt does suggest ongoing wt loss.  At this time, pt appears to be at her baseline. Will continue to provide Ensure BID. Will add a liquid mvi given minimal intake. Appreciate nursing feeding assistance.    Meds: Aricept, Abilify, PO abx, Remeron, Namenda, Aricept, Ativan,  Labs: Na: 146->142, K:3.4, Albumin:3.1,   Recent Labs  Lab 04/19/18 0502 04/20/18 0354 04/21/18 0457  NA 144 146* 142  K 3.6 3.4* 3.4*  CL 116* 122* 117*  CO2 19* 20* 18*  BUN 26* 17 9  CREATININE 1.06* 0.65 0.62  CALCIUM 8.6* 8.4* 8.3*  MG  --   --  1.9  GLUCOSE 126* 90 90   NUTRITION - FOCUSED PHYSICAL EXAM:   Most Recent Value  Orbital Region  Mild depletion  Upper Arm Region  Moderate depletion  Thoracic and Lumbar Region  Moderate depletion  Buccal Region  Mild depletion  Clavicle Bone Region  Mild depletion  Clavicle and Acromion Bone Region  Mild depletion  Scapular Bone Region  Unable to assess  Dorsal Hand  Mild depletion  Patellar Region  No depletion  Anterior Thigh Region  No depletion  Posterior Calf Region  No depletion  Edema (RD Assessment)  None  Hair  Reviewed  Eyes  Reviewed  Mouth  Reviewed  Skin  Reviewed  Nails  Reviewed     Diet Order:   Diet Order            DIET SOFT Room service appropriate? Yes; Fluid consistency: Thin  Diet effective now  EDUCATION NEEDS:  No education needs have been identified at this time  Skin:  Skin Assessment: Reviewed RN Assessment  Last BM:  11/28  Height:  Ht Readings from Last 1 Encounters:  04/19/18 '5\' 2"'  (1.575 m)   Weight:  Wt Readings from Last 1 Encounters:  04/23/18 49.6 kg   Wt Readings from Last 10 Encounters:  04/23/18 49.6 kg  11/23/17 53.2 kg  08/29/17 63.5 kg  12/30/16 63.5 kg  12/29/16 63.5 kg  09/03/16 63 kg  07/14/16 61.5 kg  12/06/13 44 kg  11/02/13 45.2 kg  08/02/13 42.6 kg   Ideal Body Weight:  50 kg  BMI:   Body mass index is 20.01 kg/m.  Estimated Nutritional Needs:  Kcal:  1500-1700 kcals (30-34 kcal/kg bw) Protein:  75-85g (1.5-1.7 g/kg bw) Fluid:  >1.5 L fluid (45m/kcal)  NBurtis JunesRD, LDN, CNSC Clinical Nutrition Available Tues-Sat via Pager: [phone]/29/2019 12:33 PM

## 2018-04-24 DIAGNOSIS — E785 Hyperlipidemia, unspecified: Secondary | ICD-10-CM

## 2018-04-24 DIAGNOSIS — N1330 Unspecified hydronephrosis: Secondary | ICD-10-CM | POA: Diagnosis not present

## 2018-04-24 DIAGNOSIS — F0390 Unspecified dementia without behavioral disturbance: Secondary | ICD-10-CM | POA: Diagnosis not present

## 2018-04-24 DIAGNOSIS — A419 Sepsis, unspecified organism: Secondary | ICD-10-CM | POA: Diagnosis not present

## 2018-04-24 DIAGNOSIS — T83091D Other mechanical complication of indwelling urethral catheter, subsequent encounter: Secondary | ICD-10-CM

## 2018-04-24 LAB — CULTURE, BLOOD (ROUTINE X 2)
CULTURE: NO GROWTH
Culture: NO GROWTH
Special Requests: ADEQUATE
Special Requests: ADEQUATE

## 2018-04-24 MED ORDER — ENSURE ENLIVE PO LIQD: 237.0000 mL | Freq: Two times a day (BID) | ORAL | 12 refills | Status: AC

## 2018-04-24 MED ORDER — CEPHALEXIN 500 MG PO CAPS: 500.0000 mg | ORAL_CAPSULE | Freq: Three times a day (TID) | ORAL | Status: DC

## 2018-04-24 MED ORDER — CEPHALEXIN 500 MG PO CAPS: 500.0000 mg | ORAL_CAPSULE | Freq: Three times a day (TID) | ORAL | 0 refills | Status: DC

## 2018-04-24 NOTE — Discharge Summary (Signed)
Physician Discharge Summary  Kimberly [name] NFA:[id number] DOB: 10-31-1950 DOA: 04/18/2018  PCP: Pearson Grippe, MD  Admit date: 04/18/2018 Discharge date: 04/24/2018  Admitted From: ALF Disposition:  ALF with home health  Recommendations for Outpatient Follow-up:  1. Last dose of Keflex on 11/31 evening 2. Please change foley monthly- next change on 12/24     Discharge Condition:  stable  CODE STATUS:  DNR   Diet recommendation:  Regular diet Consultations:  none   Discharge Diagnoses:  Principal Problem:   Sepsis secondary to UTI / obstructed chronic foley Active Problems:   Hypothyroidism   Dementia without behavioral disturbance (HCC)   Hyperlipidemia   Protein-calorie malnutrition, severe    Brief Summary: 67 year old female with multiple comorbidities including history of dementia, chronic foley for urinary retention, unspecified CHF, COPD, degenerative disc disease, depression, dysarthria, impaired hearing and legal blindness, anemia, gait abnormality history of CVA with right-sided hemiparesis, hypertension, hypothyroidism, tobacco abuse, diarrhea,   brought to the ED from an ALF for abdominal pain and explosive diarrhea on the day of admission.    Patient was suspected to be septic with UTI with associated malfunctioning of chronic Foley catheter.  CT on admission showed hydroureteronephrosis.  Foley was exchanged in the ED with significant amount of urinary output.  Patient placed on empiric antibiotic for sepsis secondary to UTI.  Sepsis physiology resolved.  Antibiotic de-escalated to Keflex by mouth on 04/22/2018.  I have picked up this patient today. She is awaiting return to SNF and a palliative care consult.  Hospital Course:  Sepsis secondary to UTI/chronic Foley with obstructed foley- - Fever 101, WBC 12.8 CT > 1. Severely distended urinary bladder with severe bilateral hydroureteronephrosis. 2. Questionable functioning of Foley catheter.  Is there output? 3.  Aortic Atherosclerosis  - follow-up abdominal CT showed significant improvement in the hydronephrosis - Culture noted to be contaminated and unhelpful - Prior urine cultures have grown Klebsiella, Proteus (recently) and E. coli in the past all of which have been pansensitive. - defervesced on Cefepime- transitioned to Ceftriaxone and to Keflex- tomorrow is the last day  Incidental finding of 3 separate hyperdense lesions in the right kidney Recommend non-urgent renal protocol MRI when patient able to tolerate breath-hold technique.  This can be done as outpatient if patient able to participate.  Dementia without behavioral disturbances - stable- cont Namenda and Aricept  Depression/ Anxiety - cont Zoloft, Abilify, Remeron, Ativan which are chronic medications  Severe protein calorie malnutrition - Ensure added  Disposition: per PT/OT, patient can return to ALF - needs 24 hr supervision    Discharge Exam: Vitals:   04/23/18 2114 04/24/18 0520  BP: 117/74 123/76  Pulse: 81 78  Resp: 18 18  Temp: 98.7 F (37.1 C) 98.6 F (37 C)  SpO2: 95% 90%   Vitals:   04/23/18 1231 04/23/18 1506 04/23/18 2114 04/24/18 0520  BP:  124/81 117/74 123/76  Pulse:  86 81 78  Resp:  18 18 18   Temp:  98.3 F (36.8 C) 98.7 F (37.1 C) 98.6 F (37 C)  TempSrc:  Oral Oral Oral  SpO2:  95% 95% 90%  Weight: 49.6 kg     Height:        General: Pt is alert, awake, not in acute distress, very hard of hearing and confused Cardiovascular: RRR, S1/S2 +, no rubs, no gallops Respiratory: CTA bilaterally, no wheezing, no rhonchi Abdominal: Soft, NT, ND, bowel sounds + Extremities: no edema, no cyanosis   Discharge Instructions  Discharge Instructions    Increase activity slowly   Complete by:  As directed      Allergies as of 04/24/2018   No Known Allergies     Medication List    TAKE these medications   acetaminophen 650 MG CR tablet Commonly known as:  TYLENOL Take 650 mg by mouth 2  (two) times daily.   ARIPiprazole 2 MG tablet Commonly known as:  ABILIFY Take 2 mg by mouth at bedtime.   atorvastatin 10 MG tablet Commonly known as:  LIPITOR Take 10 mg by mouth every evening.   cephALEXin 500 MG capsule Commonly known as:  KEFLEX Take 1 capsule (500 mg total) by mouth every 8 (eight) hours. What changed:  when to take this   cholecalciferol 10 MCG (400 UNIT) Tabs tablet Commonly known as:  VITAMIN D3 Take 800 Units by mouth daily.   DAILY-VITE PO Take 1 tablet by mouth daily.   donepezil 5 MG tablet Commonly known as:  ARICEPT TAKE 1 TABLET BY MOUTH AT BEDTIME.   feeding supplement (ENSURE ENLIVE) Liqd Take 237 mLs by mouth 2 (two) times daily between meals.   ICAPS AREDS FORMULA PO Take 1 tablet by mouth daily.   levothyroxine 25 MCG tablet Commonly known as:  SYNTHROID, LEVOTHROID Take 25 mcg by mouth daily.   LORazepam 0.5 MG tablet Commonly known as:  ATIVAN Take 0.5 mg by mouth 2 (two) times daily.   memantine 10 MG tablet Commonly known as:  NAMENDA Take 10 mg by mouth 2 (two) times daily.   mirtazapine 15 MG tablet Commonly known as:  REMERON Take 7.5 mg by mouth at bedtime.   sertraline 50 MG tablet Commonly known as:  ZOLOFT Take 75 mg by mouth at bedtime.   vitamin A & D ointment Apply 1 application topically 2 (two) times daily. Skin is washed, dried, then applied twice daily per [facility]       No Known Allergies   Procedures/Studies:    Ct Abdomen Pelvis Wo Contrast  Result Date: 04/20/2018 CLINICAL DATA:  Admission for sepsis secondary to UTI related to malfunctioning chronic Foley catheter. Re-evaluate hydroureter. Acute abdominal pain. EXAM: CT ABDOMEN AND PELVIS WITHOUT CONTRAST TECHNIQUE: Multidetector CT imaging of the abdomen and pelvis was performed following the standard protocol without IV contrast. COMPARISON:  Contrast-enhanced CT 2 days ago FINDINGS: Patient motion artifact. In conjunction with the lack of IV  contrast, evaluation of solid viscera is limited. Lower chest: Tiny bilateral pleural effusions with adjacent atelectasis, new from prior exam. Heart is normal in size. Hepatobiliary: No focal hepatic lesion. Calcified gallstone without pericholecystic inflammation. No biliary dilatation. Pancreas: Partially motion obscured, no evidence pancreatic inflammation. Spleen: Normal in size without focal abnormality. Adrenals/Urinary Tract: No adrenal nodule. Improved bilateral hydroureteronephrosis with residual prominence of the right renal pelvis. Nonobstructing stones in the right kidney largest measuring 7 mm in the interpolar region. Mild right renal cortical scarring. There 3 hyperdense lesions in the left kidney, largest in the lower pole measuring 11 mm. Simple cyst in the upper left kidney is partially motion obscured. Foley catheter in the urinary bladder with improved bladder distension from prior. Layering hyperdensity in the right urinary trigone suspicious for bladder stones, less likely wall calcifications. Mild perivesicular edema and bladder wall thickening persists. Stomach/Bowel: Stomach partially distended. No bowel wall thickening, obstruction or inflammatory change. Enteric contrast throughout the colon. Sigmoid colonic redundancy. Cecum is located in the mid abdomen, contrast filled appendix is normal located centrally. Distal  colonic diverticulosis without diverticulitis. Vascular/Lymphatic: Aortic atherosclerosis and tortuosity. No bulky abdominopelvic adenopathy. Reproductive: Possible 13 mm right adnexal cyst, difficult to delineate due to adjacent bowel. Uterus and left adnexa are unremarkable. Other: No free air free fluid. Musculoskeletal: Chronic compression fractures of T11 and T12, unchanged. Scoliosis with multilevel degenerative change in the spine. No acute osseous abnormalities. IMPRESSION: 1. Improved urinary bladder distention and bilateral hydroureteronephrosis from exam 2 days ago.  Persistent prominence of the right renal pelvis. Persistent but improved bladder wall thickening and perivesicular edema. 2. Nonobstructing right renal stones. Probable stones at the right urinary trigone, less likely bladder wall calcifications. 3. Three separate hyperdense lesions in the right kidney. These likely represent hyperdense cysts but are incompletely characterized. Recommend nonemergent renal protocol MRI when patient is able to tolerate breath hold technique. 4. Tiny bilateral pleural effusions with adjacent atelectasis. 5. Incidental findings of colonic diverticulosis and gallstones. Aortic Atherosclerosis (ICD10-I70.0). Electronically Signed   By: Narda RutherfordMelanie  Sanford M.D.   On: 04/20/2018 21:13   Dg Chest 1 View  Result Date: 04/18/2018 CLINICAL DATA:  Diarrhea and abdominal pain. EXAM: CHEST  1 VIEW COMPARISON:  03/12/2018 FINDINGS: Upper normal heart size with prominent left epicardial fat pad. The lungs appear clear. Mediastinum unremarkable. No blunting of the costophrenic angles. IMPRESSION: 1.  No active cardiopulmonary disease is radiographically apparent. Electronically Signed   By: Gaylyn RongWalter  Liebkemann M.D.   On: 04/18/2018 20:31   Ct Abdomen Pelvis W Contrast  Result Date: 04/19/2018 CLINICAL DATA:  Abdominal pain and diarrhea EXAM: CT ABDOMEN AND PELVIS WITH CONTRAST TECHNIQUE: Multidetector CT imaging of the abdomen and pelvis was performed using the standard protocol following bolus administration of intravenous contrast. CONTRAST:  80mL ISOVUE-300 IOPAMIDOL (ISOVUE-300) INJECTION 61% COMPARISON:  CT abdomen pelvis 12/29/2016 FINDINGS: LOWER CHEST: There is no basilar pleural or apical pericardial effusion. HEPATOBILIARY: The hepatic contours and density are normal. There is no intra- or extrahepatic biliary dilatation. Status post cholecystectomy. PANCREAS: The pancreatic parenchymal contours are normal and there is no ductal dilatation. There is no peripancreatic fluid collection.  SPLEEN: Normal. ADRENALS/URINARY TRACT: --Adrenal glands: Normal. --Right kidney/ureter: Severe hydroureter and moderate hydronephrosis. No nephrolithiasis or solid renal mass. --Left kidney/ureter: Severe hydroureter and moderate hydronephrosis. No nephrolithiasis or solid renal mass. --Urinary bladder: The urinary bladder is severely distended. There is a Foley catheter within the bladder. STOMACH/BOWEL: --Stomach/Duodenum: Small hiatal hernia. --Small bowel: No dilatation or inflammation. --Colon: No focal abnormality. Proximal colon is poorly visualized due to underdistention and streak artifact from the patient's right arm. --Appendix: Not visualized. No right lower quadrant inflammation or free fluid. VASCULAR/LYMPHATIC: Atherosclerotic calcification is present within the non-aneurysmal abdominal aorta, without hemodynamically significant stenosis. The portal vein, splenic vein, superior mesenteric vein and IVC are patent. No abdominal or pelvic lymphadenopathy. REPRODUCTIVE: No pelvic or adnexal mass. MUSCULOSKELETAL. Chronic compression deformity at T11 and T12. OTHER: None. IMPRESSION: 1. Severely distended urinary bladder with severe bilateral hydroureteronephrosis. 2. Questionable functioning of Foley catheter.  Is there output? 3. Aortic Atherosclerosis (ICD10-I70.0). Electronically Signed   By: Deatra RobinsonKevin  Herman M.D.   On: 04/19/2018 00:41   Dg Chest Port 1 View  Result Date: 04/19/2018 CLINICAL DATA:  Hypoxia after vomiting. EXAM: PORTABLE CHEST 1 VIEW COMPARISON:  Radiograph yesterday. FINDINGS: The cardiomediastinal contours are normal. No new airspace disease since yesterday. Pulmonary vasculature is normal. No consolidation, pleural effusion, or pneumothorax. No acute osseous abnormalities are seen. IMPRESSION: No acute findings or change from radiograph yesterday. Electronically Signed   By:  Narda Rutherford M.D.   On: 04/19/2018 01:09     The results of significant diagnostics from this  hospitalization (including imaging, microbiology, ancillary and laboratory) are listed below for reference.     Microbiology: Recent Results (from the past 240 hour(s))  Urine culture     Status: None   Collection Time: 04/18/18  7:54 PM  Result Value Ref Range Status   Specimen Description   Final    URINE, CLEAN CATCH Performed at [facility], [address], [place], [place] [address]    Special Requests   Final    NONE Performed at [facility], [address], [place], [place] [address]    Culture   Final    Multiple bacterial morphotypes present, none predominant. Suggest appropriate recollection if clinically indicated.   Report Status 04/20/2018 FINAL  Final  Urine culture     Status: None   Collection Time: 04/19/18 12:48 AM  Result Value Ref Range Status   Specimen Description   Final    URINE, CLEAN CATCH Performed at [facility], [address] [place], [place] [address]    Special Requests   Final    NONE Performed at [facility], [address], [place], [place] [address]    Culture   Final    Multiple bacterial morphotypes present, none predominant. Suggest appropriate recollection if clinically indicated.   Report Status 04/20/2018 FINAL  Final  Culture, blood (x 2)     Status: None   Collection Time: 04/19/18  2:24 AM  Result Value Ref Range Status   Specimen Description BLOOD LEFT WRIST  Final   Special Requests   Final    BOTTLES DRAWN AEROBIC ONLY Blood Culture adequate volume   Culture  Setup Time   Final    AEROBIC BOTTLE ONLY NO ORGANISMS SEEN CULTURE REINCUBATED FOR BETTER GROWTH    Culture   Final    NO GROWTH 5 DAYS Performed at [facility], [address] [place], [place] [address]    Report Status 04/24/2018 FINAL  Final  MRSA PCR Screening     Status: None   Collection Time: 04/19/18  3:08 AM  Result Value Ref Range Status   MRSA by PCR NEGATIVE NEGATIVE Final    Comment:        The GeneXpert MRSA Assay (FDA approved for NASAL  specimens only), is one component of a comprehensive MRSA colonization surveillance program. It is not intended to diagnose MRSA infection nor to guide or monitor treatment for MRSA infections. Performed at [facility], [address], [place], [place] [address]   Culture, blood (x 2)     Status: None   Collection Time: 04/19/18  5:02 AM  Result Value Ref Range Status   Specimen Description BLOOD LEFT ARM  Final   Special Requests   Final    BOTTLES DRAWN AEROBIC AND ANAEROBIC Blood Culture adequate volume   Culture   Final    NO GROWTH 5 DAYS Performed at [facility], [address] [place], [place] [address]    Report Status 04/24/2018 FINAL  Final     Labs: BNP (last 3 results) No results for input(s): BNP in the last 8760 hours. Basic Metabolic Panel: Recent Labs  Lab 04/18/18 2245 04/18/18 2330 04/19/18 0502 04/20/18 0354 04/21/18 0457  NA 139 139 144 146* 142  K 3.9 4.4 3.6 3.4* 3.4*  CL 107 107 116* 122* 117*  CO2 22  --  19* 20* 18*  GLUCOSE  135* 134* 126* 90 90  BUN 32* 42* 26* 17 9  CREATININE 1.47* 1.40* 1.06* 0.65 0.62  CALCIUM 9.5  --  8.6* 8.4* 8.3*  MG  --   --   --   --  1.9   Liver Function Tests: Recent Labs  Lab 04/18/18 2245 04/19/18 0502  AST 21 21  ALT 13 12  ALKPHOS 61 53  BILITOT 0.5 0.4  PROT 7.6 6.8  ALBUMIN 3.6 3.1*   Recent Labs  Lab 04/18/18 2245  LIPASE 30   No results for input(s): AMMONIA in the last 168 hours. CBC: Recent Labs  Lab 04/18/18 2245 04/18/18 2330 04/19/18 0502 04/20/18 0354 04/21/18 0457  WBC 12.7*  --  14.4* 11.9* 6.7  NEUTROABS 10.3*  --  11.7* 9.8* 4.7  HGB 12.8 15.6* 11.9* 9.5* 9.9*  HCT 42.6 46.0 39.0 32.3* 34.1*  MCV 89.1  --  89.4 92.0 95.5  PLT 353  --  328 292 262   Cardiac Enzymes: Recent Labs  Lab 04/18/18 2245  TROPONINI <0.03   BNP: Invalid input(s): POCBNP CBG: No results for input(s): GLUCAP in the last 168 hours. D-Dimer No results for input(s): DDIMER in the last 72  hours. Hgb A1c No results for input(s): HGBA1C in the last 72 hours. Lipid Profile No results for input(s): CHOL, HDL, LDLCALC, TRIG, CHOLHDL, LDLDIRECT in the last 72 hours. Thyroid function studies No results for input(s): TSH, T4TOTAL, T3FREE, THYROIDAB in the last 72 hours.  Invalid input(s): FREET3 Anemia work up No results for input(s): VITAMINB12, FOLATE, FERRITIN, TIBC, IRON, RETICCTPCT in the last 72 hours. Urinalysis    Component Value Date/Time   COLORURINE AMBER (A) 04/19/2018 0048   APPEARANCEUR TURBID (A) 04/19/2018 0048   LABSPEC 1.013 04/19/2018 0048   PHURINE 8.0 04/19/2018 0048   GLUCOSEU NEGATIVE 04/19/2018 0048   HGBUR SMALL (A) 04/19/2018 0048   BILIRUBINUR NEGATIVE 04/19/2018 0048   KETONESUR NEGATIVE 04/19/2018 0048   PROTEINUR >=300 (A) 04/19/2018 0048   UROBILINOGEN 0.2 11/02/2013 1320   NITRITE NEGATIVE 04/19/2018 0048   LEUKOCYTESUR MODERATE (A) 04/19/2018 0048   Sepsis Labs Invalid input(s): PROCALCITONIN,  WBC,  LACTICIDVEN Microbiology Recent Results (from the past 240 hour(s))  Urine culture     Status: None   Collection Time: 04/18/18  7:54 PM  Result Value Ref Range Status   Specimen Description   Final    URINE, CLEAN CATCH Performed at [facility], [address], [place], [place] [address]    Special Requests   Final    NONE Performed at [facility], [address] [place], [place] [address]    Culture   Final    Multiple bacterial morphotypes present, none predominant. Suggest appropriate recollection if clinically indicated.   Report Status 04/20/2018 FINAL  Final  Urine culture     Status: None   Collection Time: 04/19/18 12:48 AM  Result Value Ref Range Status   Specimen Description   Final    URINE, CLEAN CATCH Performed at [facility], [address] [place], [place] [address]    Special Requests   Final    NONE Performed at [facility], [address], [place], [place] [address]    Culture   Final    Multiple  bacterial morphotypes present, none predominant. Suggest appropriate recollection if clinically indicated.   Report Status 04/20/2018 FINAL  Final  Culture, blood (x 2)     Status: None   Collection Time: 04/19/18  2:24 AM  Result Value Ref Range Status   Specimen Description BLOOD LEFT WRIST  Final   Special Requests   Final    BOTTLES DRAWN AEROBIC ONLY Blood Culture adequate volume   Culture  Setup Time   Final    AEROBIC BOTTLE ONLY NO ORGANISMS SEEN CULTURE REINCUBATED FOR BETTER GROWTH    Culture   Final    NO GROWTH 5 DAYS Performed at [facility], [address] [place], [place] [address]    Report Status 04/24/2018 FINAL  Final  MRSA PCR Screening     Status: None   Collection Time: 04/19/18  3:08 AM  Result Value Ref Range Status   MRSA by PCR NEGATIVE NEGATIVE Final    Comment:        The GeneXpert MRSA Assay (FDA approved for NASAL specimens only), is one component of a comprehensive MRSA colonization surveillance program. It is not intended to diagnose MRSA infection nor to guide or monitor treatment for MRSA infections. Performed at [facility], [address], [place], [place] [address]   Culture, blood (x 2)     Status: None   Collection Time: 04/19/18  5:02 AM  Result Value Ref Range Status   Specimen Description BLOOD LEFT ARM  Final   Special Requests   Final    BOTTLES DRAWN AEROBIC AND ANAEROBIC Blood Culture adequate volume   Culture   Final    NO GROWTH 5 DAYS Performed at [facility], [address], [place], [place] [address]    Report Status 04/24/2018 FINAL  Final     Time coordinating discharge in minutes: 65  SIGNED:   Calvert Cantor, MD  [facility] 04/24/2018, 10:59 AM Pager   If 7PM-7AM, please contact night-coverage [email or web address] Password [email or web address]

## 2018-04-24 NOTE — [place] FL2 (Signed)
[place] MEDICAID FL2 LEVEL OF CARE SCREENING TOOL     IDENTIFICATION  Patient Name: Kimberly [name] Birthdate: 1950-09-15 Sex: female Admission Date (Current Location): 04/18/2018  [facility] and [place] Number:  [employer] and Address:  [facility],  [address], [place] [address]      Provider Number: [id number]  Attending Physician Name and Address:  Calvert Cantor, MD  Relative Name and Phone Number:       Current Level of Care: Hospital Recommended Level of Care: Assisted Living Facility Prior Approval Number:    Date Approved/Denied:   PASRR Number:    Discharge Plan: Other (Comment)(ALF)    Current Diagnoses: Patient Active Problem List   Diagnosis Date Noted  . Protein-calorie malnutrition, severe 04/23/2018  . Hydroureteronephrosis 04/19/2018  . ARF (acute renal failure) (HCC) 11/24/2017  . MRSA bacteremia 12/31/2016  . Sepsis secondary to UTI (HCC) 12/30/2016  . Urinary retention 07/18/2016  . Essential hypertension 07/18/2016  . Hyperlipidemia 07/18/2016  . Ischemic stroke (HCC) - L insular embolic infarct s/p tPA, source unknown 07/14/2016  . Acute right hemiparesis (HCC)   . Dysarthria   . Dementia without behavioral disturbance (HCC)   . Syncope 11/02/2013  . Syncope and collapse 11/02/2013  . Blind 11/02/2013  . Hypothyroidism 11/02/2013  . History of CVA (cerebrovascular accident) 11/02/2013  . Bradycardia 11/02/2013  . Tobacco abuse 11/02/2013  . Memory disorder 08/02/2013  . Abnormality of gait 08/02/2013  . Chronic diastolic heart failure (HCC) 12/19/2011  . ANEMIA 05/04/2009  . LEUKOCYTOSIS UNSPECIFIED 05/04/2009  . 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 ABDOMINAL PAIN, LOWER 05/04/2009    Orientation RESPIRATION BLADDER Height & Weight     Self  Normal External catheter(foley) Weight: 109 lb 6.4 oz (49.6 kg) Height:  5\' 2"  (157.5 cm)  BEHAVIORAL SYMPTOMS/MOOD NEUROLOGICAL BOWEL NUTRITION STATUS   Continent Diet(regualr, no added table salt)  AMBULATORY STATUS COMMUNICATION OF NEEDS Skin   Limited Assist(walker and staff guidance) Verbally Normal                       Personal Care Assistance Level of Assistance  Bathing, Feeding, Dressing Bathing Assistance: Limited assistance Feeding assistance: Limited assistance Dressing Assistance: Limited assistance     Functional Limitations Info  Sight, Hearing, Speech Sight Info: Impaired(blindness) Hearing Info: Impaired(Hard of Hearing) Speech Info: Adequate    SPECIAL CARE FACTORS FREQUENCY                       Contractures Contractures Info: Not present    Additional Factors Info  Code Status, Allergies, Psychotropic Code Status Info: DNR Allergies Info: NKA Psychotropic Info: Abilify, Ativan, Remeron, Zoloft         Current Medications (04/24/2018):  This is the current hospital active medication list Current Facility-Administered Medications  Medication Dose Route Frequency Provider Last Rate Last Dose  . 0.9 %  sodium chloride infusion   Intravenous Continuous Candelaria Stagers T, MD 10 mL/hr at 04/23/18 0800    . acetaminophen (TYLENOL) suppository 650 mg  650 mg Rectal Q6H PRN Bobette Mo, MD   650 mg at 04/19/18 0448  . acetaminophen (TYLENOL) tablet 650 mg  650 mg Oral Q6H PRN Bobette Mo, MD   650 mg at 04/20/18 2233  . ARIPiprazole (ABILIFY) tablet 2 mg  2 mg Oral QHS Bobette Mo, MD   2 mg at 04/23/18 2118  . atorvastatin (LIPITOR) tablet 10 mg  10 mg Oral QPM Bobette Mortiz, David Manuel, MD   10 mg at 04/23/18 1824  . cephALEXin (KEFLEX) capsule 500 mg  500 mg Oral Q8H Candelaria StagersGonfa, Taye T, MD   500 mg at 04/24/18 0658  . donepezil (ARICEPT) tablet 5 mg  5 mg Oral QHS Bobette Mortiz, David Manuel, MD   5 mg at 04/23/18 2118  . feeding supplement (ENSURE ENLIVE) (ENSURE ENLIVE) liquid 237 mL  237 mL Oral BID BM Gonfa, Taye T, MD   237 mL at 04/24/18 1057  . heparin injection 5,000 Units  5,000 Units  Subcutaneous Q8H Bobette Mortiz, David Manuel, MD   5,000 Units at 04/24/18 [phone]  . iopamidol (ISOVUE-300) 61 % injection 100 mL  100 mL Intravenous Once PRN Sherryll BurgerShah, Pratik D, DO      . levothyroxine (SYNTHROID, LEVOTHROID) tablet 25 mcg  25 mcg Oral Daily Bobette Mortiz, David Manuel, MD   25 mcg at 04/24/18 [phone]  . LORazepam (ATIVAN) tablet 0.5 mg  0.5 mg Oral BID Bobette Mortiz, David Manuel, MD   0.5 mg at 04/24/18 1053  . memantine (NAMENDA) tablet 10 mg  10 mg Oral BID Bobette Mortiz, David Manuel, MD   10 mg at 04/24/18 1053  . mirtazapine (REMERON) tablet 7.5 mg  7.5 mg Oral QHS Bobette Mortiz, David Manuel, MD   7.5 mg at 04/23/18 2119  . multivitamin liquid 15 mL  15 mL Oral Daily Candelaria StagersGonfa, Taye T, MD   15 mL at 04/24/18 1053  . sertraline (ZOLOFT) tablet 75 mg  75 mg Oral QHS Bobette Mortiz, David Manuel, MD   75 mg at 04/23/18 2119  . sodium chloride flush (NS) 0.9 % injection 10-40 mL  10-40 mL Intracatheter Q12H Shah, Pratik D, DO   10 mL at 04/24/18 1055  . sodium chloride flush (NS) 0.9 % injection 10-40 mL  10-40 mL Intracatheter PRN Sherryll BurgerShah, Pratik D, DO         Discharge Medications: TAKE these medications   acetaminophen 650 MG CR tablet Commonly known as:  TYLENOL Take 650 mg by mouth 2 (two) times daily.   ARIPiprazole 2 MG tablet Commonly known as:  ABILIFY Take 2 mg by mouth at bedtime.   atorvastatin 10 MG tablet Commonly known as:  LIPITOR Take 10 mg by mouth every evening.   cephALEXin 500 MG capsule Commonly known as:  KEFLEX Take 1 capsule (500 mg total) by mouth every 8 (eight) hours. What changed:  when to take this   cholecalciferol 10 MCG (400 UNIT) Tabs tablet Commonly known as:  VITAMIN D3 Take 800 Units by mouth daily.   DAILY-VITE PO Take 1 tablet by mouth daily.   donepezil 5 MG tablet Commonly known as:  ARICEPT TAKE 1 TABLET BY MOUTH AT BEDTIME.   feeding supplement (ENSURE ENLIVE) Liqd Take 237 mLs by mouth 2 (two) times daily between meals.   ICAPS AREDS FORMULA PO Take 1 tablet by  mouth daily.   levothyroxine 25 MCG tablet Commonly known as:  SYNTHROID, LEVOTHROID Take 25 mcg by mouth daily.   LORazepam 0.5 MG tablet Commonly known as:  ATIVAN Take 0.5 mg by mouth 2 (two) times daily.   memantine 10 MG tablet Commonly known as:  NAMENDA Take 10 mg by mouth 2 (two) times daily.   mirtazapine 15 MG tablet Commonly known as:  REMERON Take 7.5 mg by mouth at bedtime.   sertraline 50 MG tablet Commonly known as:  ZOLOFT Take 75 mg by mouth at bedtime.   vitamin A & D  ointment Apply 1 application topically 2 (two) times daily. Skin is washed, dried, then applied twice daily per [facility]        Relevant Imaging Results:  Relevant Lab Results:   Additional Information    Judi Cong, LCSW

## 2018-04-24 NOTE — Clinical Social Work Note (Signed)
The CSW spoke with a representative from [facility] to confirm that the patient can return. The representative reported that any new medications would need to be sent to the [facility] as their regular [facility] is closed on the weekend. The CSW updated the attending MD and has sent the discharge paperwork to the facility. The CSW will print the Medical necessity form to the department printer when able. CSW will then sign off. Please consult should needs arise.  Argentina PonderKaren Martha Jerrine Urschel, MSW, Theresia MajorsLCSWA [phone]

## 2018-04-24 NOTE — Care Management Note (Signed)
Case Management Note  Patient Details  Name: Rae MarSara L Gelinas MRN: [id number] Date of Birth: 1950/07/08  Subjective/Objective:    Patient to be discharged per MD order. Orders in place for home health services. Referral to [facility]. Patient to return to ALF, per SW. Left voicemail with daughter Arline AspCindy to update her on disposition planning.                  Action/Plan:   Expected Discharge Date:  04/24/18               Expected Discharge Plan:  Home w Home Health Services  In-House Referral:     Discharge planning Services  CM Consult  Post Acute Care Choice:    Choice offered to:  Patient  DME Arranged:    DME Agency:     HH Arranged:  RN, PT, OT HH Agency:  [facility]  Status of Service:  Completed, signed off  If discussed at Long Length of Stay Meetings, dates discussed:    Additional Comments:  Virgel ManifoldJosh A Alaiyah Bollman, RN 04/24/2018, 2:19 PM

## 2018-05-26 DIAGNOSIS — M519 Unspecified thoracic, thoracolumbar and lumbosacral intervertebral disc disorder: Secondary | ICD-10-CM | POA: Insufficient documentation

## 2018-05-26 DIAGNOSIS — J449 Chronic obstructive pulmonary disease, unspecified: Secondary | ICD-10-CM | POA: Insufficient documentation

## 2018-05-26 DIAGNOSIS — F419 Anxiety disorder, unspecified: Secondary | ICD-10-CM | POA: Insufficient documentation

## 2018-05-26 DIAGNOSIS — F17210 Nicotine dependence, cigarettes, uncomplicated: Secondary | ICD-10-CM | POA: Insufficient documentation

## 2018-07-18 ENCOUNTER — Emergency Department (HOSPITAL_COMMUNITY)
Admission: EM | Admit: 2018-07-18 | Discharge: 2018-07-18 | Disposition: A | Discharge status: Skilled Nursing Facility | Payer: Medicare Other | Attending: Emergency Medicine | Admitting: Emergency Medicine

## 2018-07-18 ENCOUNTER — Encounter (HOSPITAL_COMMUNITY): Payer: Self-pay | Admitting: Emergency Medicine

## 2018-07-18 DIAGNOSIS — I509 Heart failure, unspecified: Secondary | ICD-10-CM | POA: Diagnosis not present

## 2018-07-18 DIAGNOSIS — Y658 Other specified misadventures during surgical and medical care: Secondary | ICD-10-CM | POA: Diagnosis not present

## 2018-07-18 DIAGNOSIS — E039 Hypothyroidism, unspecified: Secondary | ICD-10-CM | POA: Insufficient documentation

## 2018-07-18 DIAGNOSIS — T839XXA Unspecified complication of genitourinary prosthetic device, implant and graft, initial encounter: Secondary | ICD-10-CM | POA: Diagnosis not present

## 2018-07-18 DIAGNOSIS — I110 Hypertensive heart disease with heart failure: Secondary | ICD-10-CM | POA: Insufficient documentation

## 2018-07-18 DIAGNOSIS — N939 Abnormal uterine and vaginal bleeding, unspecified: Secondary | ICD-10-CM | POA: Diagnosis present

## 2018-07-18 DIAGNOSIS — J449 Chronic obstructive pulmonary disease, unspecified: Secondary | ICD-10-CM | POA: Insufficient documentation

## 2018-07-18 DIAGNOSIS — Z87891 Personal history of nicotine dependence: Secondary | ICD-10-CM | POA: Insufficient documentation

## 2018-07-18 LAB — URINALYSIS, ROUTINE W REFLEX MICROSCOPIC
Bilirubin Urine: NEGATIVE
GLUCOSE, UA: NEGATIVE mg/dL
KETONES UR: NEGATIVE mg/dL
Nitrite: POSITIVE — AB
PH: 6 (ref 5.0–8.0)
PROTEIN: 30 mg/dL — AB
Specific Gravity, Urine: 1.017 (ref 1.005–1.030)

## 2018-07-18 NOTE — ED Notes (Signed)
Report given to [facility] staff,

## 2018-07-18 NOTE — Discharge Instructions (Addendum)
You were evaluated in the Emergency Department and after careful evaluation, we did not find any emergent condition requiring admission or further testing in the hospital.  We replaced your foley catheter today.  We sent your urine sample to the lab for culture and will call your with any abnormal results.  Please return to the Emergency Department if you experience any worsening of your condition.  We encourage you to follow up with a primary care provider.  Thank you for allowing [place] to be a part of your care.

## 2018-07-18 NOTE — ED Notes (Signed)
[facility] contacted [facility]

## 2018-07-18 NOTE — ED Provider Notes (Signed)
[facility] Emergency Department Provider Note MRN:  [id number]  Arrival date & time: 07/18/18     Chief Complaint   Vaginal Bleeding   History of Present Illness   Kimberly [name] is a 67 y.o. year-old female with a history of dementia, COPD, CHF presenting to the ED with chief complaint of bleeding.  Per report, patient pulled out her indwelling Foley catheter, and was experiencing some bleeding afterwards.  Sent here by care facility.  I was unable to obtain an accurate HPI, PMH, or ROS due to the patient's dementia.  Review of Systems  Positive for dislodged Foley catheter, bleeding.  Patient's Health History    Past Medical History:  Diagnosis Date  . Abnormality of gait 08/02/2013  . Anemia   . Arthritis    osteoarthritis  . Blind   . CHF (congestive heart failure) (HCC)   . COPD (chronic obstructive pulmonary disease) (HCC)   . DDD (degenerative disc disease)   . Depression   . Dysarthria   . HOH (hard of hearing)   . Hyperlipidemia   . Hypertension   . Hypothyroidism   . Memory disorder 08/02/2013  . Pneumonia   . Renal disorder   . Right hemiparesis (HCC)   . Sepsis (HCC)   . Sepsis (HCC)   . Shortness of breath   . Stroke (HCC)   . Tobacco abuse 11/02/2013  . Urinary retention     Past Surgical History:  Procedure Laterality Date  . ARM DEBRIDEMENT     as child  . CATARACT EXTRACTION W/PHACO  04/28/2011   Procedure: CATARACT EXTRACTION PHACO AND INTRAOCULAR LENS PLACEMENT (IOC);  Surgeon: Susa Simmonds;  Location: [facility] ORS;  Service: Ophthalmology;  Laterality: Left;  CDE: 5.37  . CESAREAN SECTION    . 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 STRABISMUS SURGERY     age 84  . TEE WITHOUT CARDIOVERSION N/A 01/02/2017   Procedure: TRANSESOPHAGEAL ECHOCARDIOGRAM (TEE);  Surgeon: Antoine Poche, MD;  Location: [facility] ENDO SUITE;  Service: Endoscopy;   Laterality: N/A;    Family History  Problem Relation Age of Onset  . Diabetes Brother   . Diabetes Brother   . Diabetes Mother   . Cancer - Lung Father   . Colon cancer Other        neg hx?     Social History   Socioeconomic History  . Marital status: Divorced    Spouse name: Not on file  . Number of children: 2  . Years of education: hs  . Highest education level: Not on file  Occupational History  . Occupation: Disabled    Associate Professor: NOT EMPLOYED  Social Needs  . Financial resource strain: Not on file  . Food insecurity:    Worry: Not on file    Inability: Not on file  . Transportation needs:    Medical: Not on file    Non-medical: Not on file  Tobacco Use  . Smoking status: Former Smoker    Packs/day: 0.50    Types: Cigarettes  . Smokeless tobacco: Never Used  Substance and Sexual Activity  . Alcohol use: No  . Drug use: No  . Sexual activity: Never  Lifestyle  . Physical activity:    Days per week: Not on file    Minutes per session: Not on file  . Stress: Not on file  Relationships  . Social  connections:    Talks on phone: Not on file    Gets together: Not on file    Attends religious service: Not on file    Active member of club or organization: Not on file    Attends meetings of clubs or organizations: Not on file    Relationship status: Not on file  . Intimate partner violence:    Fear of current or ex partner: Not on file    Emotionally abused: Not on file    Physically abused: Not on file    Forced sexual activity: Not on file  Other Topics Concern  . Not on file  Social History Narrative   Patient is single and lives at [facility].   Patient has two adult children.   Patient is retired.   Patient has a high school education.   Patient is left-handed.   Patient drinks two cups of coffee and tea daily.     Physical Exam  Vital Signs and Nursing Notes reviewed Vitals:   07/18/18 1801  BP: 124/72  Pulse: 72  Resp: 16  Temp: 97.9  F (36.6 C)  SpO2: 93%    CONSTITUTIONAL: Chronically ill-appearing, NAD NEURO: Oriented to name only, awake, moving all extremities EYES:  eyes equal and reactive ENT/NECK:  no LAD, no JVD CARDIO: Regular rate, well-perfused, normal S1 and S2 PULM:  CTAB no wheezing or rhonchi GI/GU:  normal bowel sounds, non-distended, non-tender; normal-appearing external genitalia with no active bleeding from the vagina or urethra MSK/SPINE:  No gross deformities, no edema SKIN:  no rash, atraumatic PSYCH:  Appropriate speech and behavior  Diagnostic and Interventional Summary    Labs Reviewed  URINALYSIS, ROUTINE W REFLEX MICROSCOPIC - Abnormal; Notable for the following components:      Result Value   APPearance CLOUDY (*)    Hgb urine dipstick MODERATE (*)    Protein, ur 30 (*)    Nitrite POSITIVE (*)    Leukocytes,Ua LARGE (*)    WBC, UA >50 (*)    Bacteria, UA FEW (*)    All other components within normal limits  URINE CULTURE    No orders to display    Medications - No data to display   Procedures Critical Care  ED Course and Medical Decision Making  I have reviewed the triage vital signs and the nursing notes.  Pertinent labs & imaging results that were available during my care of the patient were reviewed by me and considered in my medical decision making (see below for details).  Will replace Foley catheter given her history of urinary retention, patient is without any other complaints, will attempt to call family member.  2 attempts to call family, no answer.  Foley catheter replaced, urine sample sent to the lab by nursing.  Urinalysis shows positive nitrates, positive leuks.  However patient has a longstanding indwelling Foley catheter, and it is much more likely that she is just colonized rather than having an acute UTI, given that she has no fever, no change in mental status, no suprapubic tenderness.  Vital signs normal here, nothing to suggest sepsis.  Will send for  culture.  Appropriate for discharge and return back to her assisted living facility.  Elmer Sow. Pilar Plate, MD [facility] Emergency Medicine [facility] [email or web address]  Final Clinical Impressions(s) / ED Diagnoses     ICD-10-CM   1. Complication of Foley catheter, initial encounter (HCC) [email or web address]     ED Discharge Orders  None         Sabas Sous, MD 07/18/18 2001

## 2018-07-18 NOTE — ED Triage Notes (Signed)
Pt here from [facility] for evaluation of bleeding after pulling foley catheter out today.

## 2018-07-21 LAB — URINE CULTURE: Culture: >=100000 — AB

## 2018-07-22 ENCOUNTER — Telehealth: Payer: Self-pay

## 2018-07-22 NOTE — Telephone Encounter (Signed)
Post ED Visit - Positive Culture Follow-up  Culture report reviewed by antimicrobial stewardship pharmacist: [facility] []  Enzo Bi, Pharm.D. []  Celedonio Miyamoto, Pharm.D., BCPS AQ-ID []  Garvin Fila, Pharm.D., BCPS []  Georgina Pillion, Pharm.D., BCPS []  Barrett, [address].D., BCPS, AAHIVP []  Estella Husk, Pharm.D., BCPS, AAHIVP []  Lysle Pearl, PharmD, BCPS []  Phillips Climes, PharmD, BCPS []  Agapito Games, PharmD, BCPS []  Verlan Friends, PharmD []  Mervyn Gay, PharmD, BCPS []  Vinnie Level, PharmD [facility] Bertin [facility] []  Len Childs, PharmD []  Greer Pickerel, PharmD []  Adalberto Cole, PharmD []  Perlie Gold, Rph []  Lonell Face) Jean Rosenthal, PharmD []  Earl Many, PharmD []  Junita Push, PharmD []  Dorna Leitz, PharmD []  Terrilee Files, PharmD []  Lynann Beaver, PharmD []  Keturah Barre, PharmD []  Loralee Pacas, PharmD []  Bernadene Person, PharmD   Positive urine culture  and no further patient follow-up is required at this time.  Jerry Caras 07/22/2018, 1:33 PM

## 2018-07-22 NOTE — Progress Notes (Signed)
ED Antimicrobial Stewardship Positive Culture Follow Up   Kimberly [name] is an 68 y.o. female who presented to [facility] on 07/18/2018 with a chief complaint of  Chief Complaint  Patient presents with  . Vaginal Bleeding    Recent Results (from the past 720 hour(s))  Urine culture     Status: Abnormal   Collection Time: 07/18/18  7:18 PM  Result Value Ref Range Status   Specimen Description   Final    URINE, CATHETERIZED Performed at [facility], [address], [place], [place] [address]    Special Requests   Final    NONE Performed at [facility], [address], [place], [place] [address]    Culture >=100,000 COLONIES/mL PROTEUS MIRABILIS (A)  Final   Report Status 07/21/2018 FINAL  Final   Organism ID, Bacteria PROTEUS MIRABILIS (A)  Final      Susceptibility   Proteus mirabilis - MIC*    AMPICILLIN <=2 SENSITIVE Sensitive     CEFAZOLIN <=4 SENSITIVE Sensitive     CEFTRIAXONE <=1 SENSITIVE Sensitive     CIPROFLOXACIN <=0.25 SENSITIVE Sensitive     GENTAMICIN <=1 SENSITIVE Sensitive     IMIPENEM 4 SENSITIVE Sensitive     NITROFURANTOIN 128 RESISTANT Resistant     TRIMETH/SULFA <=20 SENSITIVE Sensitive     AMPICILLIN/SULBACTAM <=2 SENSITIVE Sensitive     PIP/TAZO <=4 SENSITIVE Sensitive     * >=100,000 COLONIES/mL PROTEUS MIRABILIS   [x]  Patient discharged originally without antimicrobial agent  New antibiotic prescription: No further treatment recommended at this time.  ED Provider: Burna Forts, PA-C   [name] 07/22/2018, 9:41 AM Clinical Pharmacist Monday - Friday phone -  [phone] Saturday - Sunday phone - [phone]

## 2018-10-11 DIAGNOSIS — Z87440 Personal history of urinary (tract) infections: Secondary | ICD-10-CM | POA: Insufficient documentation

## 2018-10-25 DIAGNOSIS — I69351 Hemiplegia and hemiparesis following cerebral infarction affecting right dominant side: Secondary | ICD-10-CM | POA: Insufficient documentation

## 2019-02-17 DIAGNOSIS — I110 Hypertensive heart disease with heart failure: Secondary | ICD-10-CM | POA: Insufficient documentation

## 2019-02-17 DIAGNOSIS — I509 Heart failure, unspecified: Secondary | ICD-10-CM | POA: Insufficient documentation

## 2019-04-19 ENCOUNTER — Emergency Department (HOSPITAL_COMMUNITY)
Admission: EM | Admit: 2019-04-19 | Discharge: 2019-04-19 | Disposition: A | Discharge status: Home / Self Care | Payer: Medicare Other | Attending: Emergency Medicine | Admitting: Emergency Medicine

## 2019-04-19 ENCOUNTER — Emergency Department (HOSPITAL_COMMUNITY): Payer: Medicare Other

## 2019-04-19 ENCOUNTER — Other Ambulatory Visit: Payer: Self-pay

## 2019-04-19 ENCOUNTER — Encounter (HOSPITAL_COMMUNITY): Payer: Self-pay | Admitting: *Deleted

## 2019-04-19 DIAGNOSIS — F0390 Unspecified dementia without behavioral disturbance: Secondary | ICD-10-CM | POA: Insufficient documentation

## 2019-04-19 DIAGNOSIS — Z8673 Personal history of transient ischemic attack (TIA), and cerebral infarction without residual deficits: Secondary | ICD-10-CM | POA: Diagnosis not present

## 2019-04-19 DIAGNOSIS — I5032 Chronic diastolic (congestive) heart failure: Secondary | ICD-10-CM | POA: Diagnosis not present

## 2019-04-19 DIAGNOSIS — I110 Hypertensive heart disease with heart failure: Secondary | ICD-10-CM | POA: Insufficient documentation

## 2019-04-19 DIAGNOSIS — Z79899 Other long term (current) drug therapy: Secondary | ICD-10-CM | POA: Diagnosis not present

## 2019-04-19 DIAGNOSIS — Z87891 Personal history of nicotine dependence: Secondary | ICD-10-CM | POA: Diagnosis not present

## 2019-04-19 DIAGNOSIS — G939 Disorder of brain, unspecified: Secondary | ICD-10-CM | POA: Diagnosis not present

## 2019-04-19 DIAGNOSIS — E039 Hypothyroidism, unspecified: Secondary | ICD-10-CM | POA: Insufficient documentation

## 2019-04-19 DIAGNOSIS — J449 Chronic obstructive pulmonary disease, unspecified: Secondary | ICD-10-CM | POA: Diagnosis not present

## 2019-04-19 DIAGNOSIS — R531 Weakness: Secondary | ICD-10-CM | POA: Diagnosis present

## 2019-04-19 DIAGNOSIS — N3001 Acute cystitis with hematuria: Secondary | ICD-10-CM | POA: Diagnosis not present

## 2019-04-19 LAB — CBC WITH DIFFERENTIAL/PLATELET
Abs Immature Granulocytes: 0.03 10*3/uL (ref 0.00–0.07)
Basophils Absolute: 0 10*3/uL (ref 0.0–0.1)
Basophils Relative: 0 %
Eosinophils Absolute: 0 10*3/uL (ref 0.0–0.5)
Eosinophils Relative: 0 %
HCT: 42.4 % (ref 36.0–46.0)
Hemoglobin: 12.6 g/dL (ref 12.0–15.0)
Immature Granulocytes: 0 %
Lymphocytes Relative: 8 %
Lymphs Abs: 0.7 10*3/uL (ref 0.7–4.0)
MCH: 27.7 pg (ref 26.0–34.0)
MCHC: 29.7 g/dL — ABNORMAL LOW (ref 30.0–36.0)
MCV: 93.2 fL (ref 80.0–100.0)
Monocytes Absolute: 0.7 10*3/uL (ref 0.1–1.0)
Monocytes Relative: 8 %
Neutro Abs: 7.8 10*3/uL — ABNORMAL HIGH (ref 1.7–7.7)
Neutrophils Relative %: 84 %
Platelets: 252 10*3/uL (ref 150–400)
RBC: 4.55 MIL/uL (ref 3.87–5.11)
RDW: 13.5 % (ref 11.5–15.5)
WBC: 9.3 10*3/uL (ref 4.0–10.5)
nRBC: 0 % (ref 0.0–0.2)

## 2019-04-19 LAB — COMPREHENSIVE METABOLIC PANEL
ALT: 16 U/L (ref 0–44)
AST: 21 U/L (ref 15–41)
Albumin: 3.7 g/dL (ref 3.5–5.0)
Alkaline Phosphatase: 72 U/L (ref 38–126)
Anion gap: 10 (ref 5–15)
BUN: 21 mg/dL (ref 8–23)
CO2: 26 mmol/L (ref 22–32)
Calcium: 8.8 mg/dL — ABNORMAL LOW (ref 8.9–10.3)
Chloride: 100 mmol/L (ref 98–111)
Creatinine, Ser: 0.88 mg/dL (ref 0.44–1.00)
GFR calc Af Amer: >60 mL/min (ref >60)
GFR calc non Af Amer: >60 mL/min (ref >60)
Glucose, Bld: 123 mg/dL — ABNORMAL HIGH (ref 70–99)
Potassium: 4 mmol/L (ref 3.5–5.1)
Sodium: 136 mmol/L (ref 135–145)
Total Bilirubin: 0.5 mg/dL (ref 0.3–1.2)
Total Protein: 7.2 g/dL (ref 6.5–8.1)

## 2019-04-19 LAB — URINALYSIS, ROUTINE W REFLEX MICROSCOPIC
Bilirubin Urine: NEGATIVE
Glucose, UA: NEGATIVE mg/dL
Ketones, ur: NEGATIVE mg/dL
Nitrite: POSITIVE — AB
Protein, ur: 30 mg/dL — AB
Specific Gravity, Urine: 1.016 (ref 1.005–1.030)
WBC, UA: >50 WBC/hpf — ABNORMAL HIGH (ref 0–5)
pH: 5 (ref 5.0–8.0)

## 2019-04-19 LAB — LACTIC ACID, PLASMA: Lactic Acid, Venous: 1.2 mmol/L (ref 0.5–1.9)

## 2019-04-19 MED ORDER — CEPHALEXIN 500 MG PO CAPS: 500.0000 mg | ORAL_CAPSULE | Freq: Two times a day (BID) | ORAL | 0 refills | Status: AC

## 2019-04-19 MED ORDER — CEPHALEXIN 500 MG PO CAPS
500.0000 mg | ORAL_CAPSULE | Freq: Once | ORAL | Status: AC
  Administered 2019-04-19: 500 mg via ORAL
  Filled 2019-04-19: qty 1

## 2019-04-19 NOTE — ED Provider Notes (Signed)
Care assumed from [facility], [place], at shift change, please see their notes for full documentation of patient's complaint/HPI. Briefly, pt here with complaint of generalized weakness. Hx limited; provided by staff at healthcare facility. Results so far show normal CT Head; no leukocytosis; and no electrolyte abnormalities. Urinalysis with nitrites, leuks, and > 50 WBC per HPF. Awaiting CXR and MRI Brain. Plan is to dispo accordingly. If remainder of workup unremarkable pt to be discharged home on oral abx for 7 days.    Physical Exam  BP 111/63 (BP Location: Right Arm)   Pulse 92   Temp (!) 97.3 F (36.3 C) (Oral)   Resp 16   Ht 5\' 1"  (1.549 m)   Wt 63.5 kg   SpO2 97%   BMI 26.45 kg/m   Physical Exam  ED Course/Procedures   Clinical Course as of Apr 18 1606  Tue Apr 19, 2019  1550 Nitrite(!): POSITIVE [JS]  1550 WBC, UA(!): >50 [JS]  1552 Hgb urine dipstick(!): SMALL [JS]    Clinical Course User Index [JS] Janeece Fitting, PA-C    Procedures  Results for orders placed or performed during the hospital encounter of 04/19/19  Blood culture (routine x 2)   Specimen: BLOOD RIGHT HAND  Result Value Ref Range   Specimen Description BLOOD RIGHT HAND    Special Requests      Blood Culture adequate volume BOTTLES DRAWN AEROBIC AND ANAEROBIC Performed at [facility], [address], [place], [place] [address]    Culture PENDING    Report Status PENDING   Blood culture (routine x 2)   Specimen: BLOOD LEFT HAND  Result Value Ref Range   Specimen Description BLOOD LEFT HAND    Special Requests      BOTTLES DRAWN AEROBIC ONLY Blood Culture adequate volume Performed at [facility], [address] [place], [place] [address]    Culture PENDING    Report Status PENDING   CBC with Differential  Result Value Ref Range   WBC 9.3 4.0 - 10.5 K/uL   RBC 4.55 3.87 - 5.11 MIL/uL   Hemoglobin 12.6 12.0 - 15.0 g/dL   HCT 42.4 36.0 - 46.0 %   MCV 93.2 80.0 - 100.0 fL   MCH 27.7 26.0 - 34.0  pg   MCHC 29.7 (L) 30.0 - 36.0 g/dL   RDW 13.5 11.5 - 15.5 %   Platelets 252 150 - 400 K/uL   nRBC 0.0 0.0 - 0.2 %   Neutrophils Relative % 84 %   Neutro Abs 7.8 (H) 1.7 - 7.7 K/uL   Lymphocytes Relative 8 %   Lymphs Abs 0.7 0.7 - 4.0 K/uL   Monocytes Relative 8 %   Monocytes Absolute 0.7 0.1 - 1.0 K/uL   Eosinophils Relative 0 %   Eosinophils Absolute 0.0 0.0 - 0.5 K/uL   Basophils Relative 0 %   Basophils Absolute 0.0 0.0 - 0.1 K/uL   Immature Granulocytes 0 %   Abs Immature Granulocytes 0.03 0.00 - 0.07 K/uL  Comprehensive metabolic panel  Result Value Ref Range   Sodium 136 135 - 145 mmol/L   Potassium 4.0 3.5 - 5.1 mmol/L   Chloride 100 98 - 111 mmol/L   CO2 26 22 - 32 mmol/L   Glucose, Bld 123 (H) 70 - 99 mg/dL   BUN 21 8 - 23 mg/dL   Creatinine, Ser 0.88 0.44 - 1.00 mg/dL   Calcium 8.8 (L) 8.9 - 10.3 mg/dL   Total Protein 7.2 6.5 -  8.1 g/dL   Albumin 3.7 3.5 - 5.0 g/dL   AST 21 15 - 41 U/L   ALT 16 0 - 44 U/L   Alkaline Phosphatase 72 38 - 126 U/L   Total Bilirubin 0.5 0.3 - 1.2 mg/dL   GFR calc non Af Amer >60 >60 mL/min   GFR calc Af Amer >60 >60 mL/min   Anion gap 10 5 - 15  Urinalysis, Routine w reflex microscopic  Result Value Ref Range   Color, Urine YELLOW YELLOW   APPearance CLOUDY (A) CLEAR   Specific Gravity, Urine 1.016 1.005 - 1.030   pH 5.0 5.0 - 8.0   Glucose, UA NEGATIVE NEGATIVE mg/dL   Hgb urine dipstick SMALL (A) NEGATIVE   Bilirubin Urine NEGATIVE NEGATIVE   Ketones, ur NEGATIVE NEGATIVE mg/dL   Protein, ur 30 (A) NEGATIVE mg/dL   Nitrite POSITIVE (A) NEGATIVE   Leukocytes,Ua LARGE (A) NEGATIVE   RBC / HPF 21-50 0 - 5 RBC/hpf   WBC, UA >50 (H) 0 - 5 WBC/hpf   Bacteria, UA FEW (A) NONE SEEN   Squamous Epithelial / LPF 0-5 0 - 5   WBC Clumps PRESENT   Lactic acid, plasma  Result Value Ref Range   Lactic Acid, Venous 1.2 0.5 - 1.9 mmol/L   Ct Head Wo Contrast  Result Date: 04/19/2019 CLINICAL DATA:  Altered level of consciousness  (LOC), unexplained. EXAM: CT HEAD WITHOUT CONTRAST TECHNIQUE: Contiguous axial images were obtained from the base of the skull through the vertex without intravenous contrast. COMPARISON:  Head CT 03/12/2018, brain MRI 07/15/2016 FINDINGS: Brain: No evidence of acute intracranial hemorrhage. No acute demarcated cortical infarction is identified. No evidence of intracranial mass. No midline shift or extra-axial fluid collection. Redemonstrated chronic infarct within the anterior left insula. Redemonstrated chronic lacunar infarct within the right thalamus. Mild generalized parenchymal atrophy. Vascular: No hyperdense vessel. Skull: Normal. Negative for fracture or focal lesion. Sinuses/Orbits: Visualized orbits demonstrate no acute abnormality. No significant paranasal sinus disease or mastoid effusion imaged levels. Other: A small chronic cystic pituitary lesion was better assessed on prior MRI 10/12/2016, although appeara grossly unchanged on today's study. IMPRESSION: 1. No evidence of acute intracranial abnormality. 2. Generalized parenchymal atrophy with redemonstrated chronic left insular and right thalamic infarcts. Electronically Signed   By: Jackey LogeKyle  Golden DO   On: 04/19/2019 15:41   Mr Brain Wo Contrast  Result Date: 04/19/2019 CLINICAL DATA:  Altered level of consciousness (LOC), unexplained EXAM: MRI HEAD WITHOUT CONTRAST TECHNIQUE: Multiplanar, multiecho pulse sequences of the brain and surrounding structures were obtained without intravenous contrast. COMPARISON:  Noncontrast head CT 04/19/2019, MRI/MRA head 07/15/2016 FINDINGS: Brain: There is no evidence of acute infarct. No midline shift or extra-axial fluid collection. No chronic intracranial blood products. Redemonstrated chronic lacunar infarct within the right thalamus. Small chronic cortical infarcts also present within the left frontal operculum and anterior left insula. Mild generalized parenchymal atrophy. An indeterminate cystic lesion  centered within the right aspect of the sella turcica may be slightly increased in size as compared to MRI 07/15/2016. This measures 0.7 x 1.3 x 0.9 cm (AP x TV x CC) on the current examination (previously 0.7 x 1.2 x 0.7 cm). Vascular: The intracranial left vertebral artery is poorly delineated and may be chronically occluded, unchanged. Flow voids otherwise maintained within the proximal large arterial vessels. Skull and upper cervical spine: No focal marrow lesion. Sinuses/Orbits: Left lens replacement. Mild ethmoid sinus mucosal thickening. No significant mastoid effusion. IMPRESSION: 1. No  evidence of acute infarct. 2. Redemonstrated chronic right thalamic lacunar infarct. Small chronic cortical infarcts also present within the left frontal operculum and anterior left insula. 3. An indeterminate cystic lesion centered within the right aspect of the sella turcica may be slightly increased in size as compared to MRI 07/15/2016, now measuring 0.7 x 1.3 x 0.9 cm. Consider nonemergent pituitary protocol MRI with contrast for further evaluation. Electronically Signed   By: Jackey Loge DO   On: 04/19/2019 16:39   Dg Chest Portable 1 View  Result Date: 04/19/2019 CLINICAL DATA:  Cough EXAM: PORTABLE CHEST 1 VIEW COMPARISON:  2019 FINDINGS: The heart size and mediastinal contours are within normal limits. No new consolidation or edema. No pleural effusion or pneumothorax. The visualized skeletal structures are unremarkable. IMPRESSION: No acute process in the chest. Electronically Signed   By: Guadlupe Spanish M.D.   On: 04/19/2019 16:38    MDM  CXR clear. MRI Brain without acute infarct; does show increase in size cystic lesion compared to MRI in 2018; will have patient follow up with neurology outpatient. Lactic acid within normal limits. Upon reevaluation patient resting comfortably in bed; again she is alert to self and place. She does not know the year or month. Moving all extremities without difficulty.  Per family this is pt's baseline with hx of dementia. Will discharge home at this time with neuro and PCP follow up. Daughter Shekina Cordell made aware of plan regarding neuro follow up.       Tanda Rockers, PA-C 04/19/19 1808    Glynn Octave, MD 04/20/19 [phone]

## 2019-04-19 NOTE — ED Notes (Signed)
Jeanella Craze POA/daughter.  [phone].

## 2019-04-19 NOTE — ED Notes (Signed)
Spoke to [employer] at [employer].  Kimberly [name] reports pt usually walks with walker with one person assist.  But today about lunch time, staff notice pt leaning to on side.  Kimberly [name] reports that pt has dementia.

## 2019-04-19 NOTE — ED Notes (Signed)
Left voicemail with Jenny Reichmann that her mom will be discharged back to facility.

## 2019-04-19 NOTE — ED Triage Notes (Signed)
Patient presents to the ED via [facility] from her assisted living due to patient "leaning to one side in wheelchair.".  Patient care team felt like she needed to be "checked out" per EMS. Patient is alert and oriented, blind.  Patient has no complaints of pain or discomfort.

## 2019-04-19 NOTE — Discharge Instructions (Signed)
Please take antibiotics as prescribed for UTI. It has been sent for culture - if the antibiotic needs to be changed we will call in 2-3 days.   MRI did show a small increase in an indeterminate cystic lesion in the brain increased in size from 2018. Please follow up with [facility] regarding this.   Follow up with PCP regarding ED visit today.

## 2019-04-19 NOTE — ED Notes (Signed)
Called for [facility] to transport back to [facility]

## 2019-04-19 NOTE — ED Provider Notes (Signed)
[facility] EMERGENCY DEPARTMENT Provider Note   CSN: [id number] Arrival date & time: 04/19/19  1352     History   Chief Complaint Chief Complaint  Patient presents with  . Weakness    HPI Kimberly [name] is a 68 y.o. female.     68 y.o female with a PMH of CHF, COPD, stroke presents to the ED via EMS with a chief complaint of weakness. According to staff at the healthcare facility, patient was ambulating with a walker when she began tilting to the right, they were concern for a likely stroke.  She was sent in here via ambulance.  Level 5 caveat.  The history is provided by the patient and medical records.    Past Medical History:  Diagnosis Date  . Abnormality of gait 08/02/2013  . Anemia   . Arthritis    osteoarthritis  . Blind   . CHF (congestive heart failure) (HCC)   . COPD (chronic obstructive pulmonary disease) (HCC)   . DDD (degenerative disc disease)   . Depression   . Dysarthria   . HOH (hard of hearing)   . Hyperlipidemia   . Hypertension   . Hypothyroidism   . Memory disorder 08/02/2013  . Pneumonia   . Renal disorder   . Right hemiparesis (HCC)   . Sepsis (HCC)   . Sepsis (HCC)   . Shortness of breath   . Stroke (HCC)   . Tobacco abuse 11/02/2013  . Urinary retention     Patient Active Problem List   Diagnosis Date Noted  . Protein-calorie malnutrition, severe 04/23/2018  . Hydroureteronephrosis 04/19/2018  . ARF (acute renal failure) (HCC) 11/24/2017  . MRSA bacteremia 12/31/2016  . Sepsis secondary to UTI (HCC) 12/30/2016  . Urinary retention 07/18/2016  . Essential hypertension 07/18/2016  . Hyperlipidemia 07/18/2016  . Ischemic stroke (HCC) - L insular embolic infarct s/p tPA, source unknown 07/14/2016  . Acute right hemiparesis (HCC)   . Dysarthria   . Dementia without behavioral disturbance (HCC)   . Syncope 11/02/2013  . Syncope and collapse 11/02/2013  . Blind 11/02/2013  . Hypothyroidism 11/02/2013  . History of CVA  (cerebrovascular accident) 11/02/2013  . Bradycardia 11/02/2013  . Tobacco abuse 11/02/2013  . Memory disorder 08/02/2013  . Abnormality of gait 08/02/2013  . Chronic diastolic heart failure (HCC) 12/19/2011  . ANEMIA 05/04/2009  . LEUKOCYTOSIS UNSPECIFIED 05/04/2009  . 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 ABDOMINAL PAIN, LOWER 05/04/2009    Past Surgical History:  Procedure Laterality Date  . ARM DEBRIDEMENT     as child  . CATARACT EXTRACTION W/PHACO  04/28/2011   Procedure: CATARACT EXTRACTION PHACO AND INTRAOCULAR LENS PLACEMENT (IOC);  Surgeon: Susa Simmondsarroll F Haines;  Location: [facility];  Service: Ophthalmology;  Laterality: Left;  CDE: 5.37  . CESAREAN SECTION    . 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 STRABISMUS SURGERY     age 89  . TEE WITHOUT CARDIOVERSION N/A 01/02/2017   Procedure: TRANSESOPHAGEAL ECHOCARDIOGRAM (TEE);  Surgeon: Antoine PocheBranch, Jonathan F, MD;  Location: [facility] SUITE;  Service: Endoscopy;  Laterality: N/A;     OB History    Gravida  2   Para  2   Term  2   Preterm      AB      Living  2     SAB      TAB      Ectopic  Multiple      Live Births               Home Medications    Prior to Admission medications   Medication Sig Start Date End Date Taking? Authorizing Provider  acetaminophen (TYLENOL) 650 MG CR tablet Take 650 mg by mouth 2 (two) times daily.    [provider]  ARIPiprazole (ABILIFY) 2 MG tablet Take 2 mg by mouth at bedtime.    [provider]  atorvastatin (LIPITOR) 10 MG tablet Take 10 mg by mouth every evening.     [provider]  cephALEXin (KEFLEX) 500 MG capsule Take 1 capsule (500 mg total) by mouth 2 (two) times daily for 7 days. 04/19/19 04/26/19  Claude Manges, PA-C  cholecalciferol (VITAMIN D) 400 UNITS TABS Take 800 Units by mouth daily.      [provider]  donepezil (ARICEPT) 5  MG tablet TAKE 1 TABLET BY MOUTH AT BEDTIME. Patient taking differently: Take 5 mg by mouth at bedtime.  01/15/15   York Spaniel, MD  feeding supplement, ENSURE ENLIVE, (ENSURE ENLIVE) LIQD Take 237 mLs by mouth 2 (two) times daily between meals. 04/24/18   Calvert Cantor, MD  levothyroxine (SYNTHROID, LEVOTHROID) 25 MCG tablet Take 25 mcg by mouth daily.      [provider]  LORazepam (ATIVAN) 0.5 MG tablet Take 0.5 mg by mouth 2 (two) times daily.     [provider]  memantine (NAMENDA) 10 MG tablet Take 10 mg by mouth 2 (two) times daily.    [provider]  mirtazapine (REMERON) 15 MG tablet Take 7.5 mg by mouth at bedtime.  08/09/16   [provider]  Multiple Vitamin (DAILY-VITE PO) Take 1 tablet by mouth daily.    [provider]  Multiple Vitamins-Minerals (ICAPS AREDS FORMULA PO) Take 1 tablet by mouth daily.    [provider]  sertraline (ZOLOFT) 50 MG tablet Take 75 mg by mouth at bedtime.     [provider]  Vitamins A & D (VITAMIN A & D) ointment Apply 1 application topically 2 (two) times daily. Skin is washed, dried, then applied twice daily per [facility]    [provider]    Family History Family History  Problem Relation Age of Onset  . Diabetes Brother   . Diabetes Brother   . Diabetes Mother   . Cancer - Lung Father   . Colon cancer Other        neg hx?     Social History Social History   Tobacco Use  . Smoking status: Former Smoker    Packs/day: 0.50    Types: Cigarettes  . Smokeless tobacco: Never Used  Substance Use Topics  . Alcohol use: No  . Drug use: No     Allergies   Patient has no known allergies.   Review of Systems Review of Systems  Unable to perform ROS: Dementia     Physical Exam Updated Vital Signs BP 111/63 (BP Location: Right Arm)   Pulse 92   Temp (!) 97.3 F (36.3 C) (Oral)   Resp 16   Ht 5\' 1"  (1.549 m)   Wt 63.5 kg   SpO2 97%   BMI 26.45 kg/m    Physical Exam Vitals signs and nursing note reviewed.  HENT:     Head: Normocephalic and atraumatic.     Nose: Nose normal.     Mouth/Throat:     Mouth: Mucous membranes are moist.  Eyes:     Pupils: Pupils are equal, round, and reactive to light.  Neck:     Musculoskeletal: Normal range of motion and neck supple.  Cardiovascular:     Rate and Rhythm: Normal rate.     Pulses: Normal pulses.  Pulmonary:     Effort: Pulmonary effort is normal.     Breath sounds: Normal breath sounds. No wheezing.  Abdominal:     General: Abdomen is flat.  Skin:    General: Skin is warm and dry.  Neurological:     Mental Status: She is alert. Mental status is at baseline.     Comments: No facial asymmetry, no dysarthria.       ED Treatments / Results  Labs (all labs ordered are listed, but only abnormal results are displayed) Labs Reviewed  CBC WITH DIFFERENTIAL/PLATELET - Abnormal; Notable for the following components:      Result Value   MCHC 29.7 (*)    Neutro Abs 7.8 (*)    All other components within normal limits  COMPREHENSIVE METABOLIC PANEL - Abnormal; Notable for the following components:   Glucose, Bld 123 (*)    Calcium 8.8 (*)    All other components within normal limits  URINALYSIS, ROUTINE W REFLEX MICROSCOPIC - Abnormal; Notable for the following components:   APPearance CLOUDY (*)    Hgb urine dipstick SMALL (*)    Protein, ur 30 (*)    Nitrite POSITIVE (*)    Leukocytes,Ua LARGE (*)    WBC, UA >50 (*)    Bacteria, UA FEW (*)    All other components within normal limits  CULTURE, BLOOD (ROUTINE X 2)  CULTURE, BLOOD (ROUTINE X 2)  URINE CULTURE  LACTIC ACID, PLASMA  LACTIC ACID, PLASMA    EKG EKG Interpretation  Date/Time:  Tuesday April 19 2019 14:32:07 EST Ventricular Rate:  85 PR Interval:    QRS Duration: 82 QT Interval:  360 QTC Calculation: 428 R Axis:   62 Text Interpretation: Sinus rhythm Borderline short PR interval Confirmed by Donnetta Hutching  [phone]) on 04/19/2019 2:52:23 PM   Radiology Ct Head Wo Contrast  Result Date: 04/19/2019 CLINICAL DATA:  Altered level of consciousness (LOC), unexplained. EXAM: CT HEAD WITHOUT CONTRAST TECHNIQUE: Contiguous axial images were obtained from the base of the skull through the vertex without intravenous contrast. COMPARISON:  Head CT 03/12/2018, brain MRI 07/15/2016 FINDINGS: Brain: No evidence of acute intracranial hemorrhage. No acute demarcated cortical infarction is identified. No evidence of intracranial mass. No midline shift or extra-axial fluid collection. Redemonstrated chronic infarct within the anterior left insula. Redemonstrated chronic lacunar infarct within the right thalamus. Mild generalized parenchymal atrophy. Vascular: No hyperdense vessel. Skull: Normal. Negative for fracture or focal lesion. Sinuses/Orbits: Visualized orbits demonstrate no acute abnormality. No significant paranasal sinus disease or mastoid effusion imaged levels. Other: A small chronic cystic pituitary lesion was better assessed on prior MRI 10/12/2016, although appeara grossly unchanged on today's study. IMPRESSION: 1. No evidence of acute intracranial abnormality. 2. Generalized parenchymal atrophy with redemonstrated chronic left insular and right thalamic infarcts. Electronically Signed   By: Jackey Loge DO   On: 04/19/2019 15:41    Procedures Procedures (including critical care time)  Medications Ordered in ED Medications - No data to display   Initial Impression / Assessment and Plan / ED Course  I have reviewed the triage vital signs and the nursing notes.  Pertinent labs & imaging results that were available during my care of the patient  were reviewed by me and considered in my medical decision making (see chart for details).  Clinical Course as of Apr 18 1608  Tue Apr 19, 2019  1550 Nitrite(!): POSITIVE [JS]  1550 WBC, UA(!): >50 [JS]  1552 Hgb urine dipstick(!): SMALL [JS]    Clinical  Course User Index [JS] Janeece Fitting, PA-C      Patient from facility [facility] assisted living in [place] presents to the ED via EMS with complaints of weakness.  History obtained from EMS reports that patient's appear to be weaker than normal.  She was leaning towards the right, some suspicion for a CVA, although no neuro checks have been performed at facility.  During my evaluation patient is alert, she is able to state her name along with date of birth however is unsure on the date and year.  ABC showed no leukocytosis, UA appeared to be cloudy on my examination, there is a small amount of hemoglobin along with positive nitrites and greater than 50 white blood cell count.  CT of her head has also been ordered.  Will call facility at [facility] in order to obtain collateral information.  UA has been sent for culture, according to the last culture on June 2020.  Sensitive to Keflex, will provide her with a 7-day course.  3:52 PM Spoke to supervisor Maudie Mercury who reported patient was walking with her walker when she noted her favoring her right side.This event occurred after lunch around 1pm, she states patient is usually able to sit however she seem to be slumped over on her chair. According to [facility] neuro checks are not preformed at the facility.   CT Head showed: 1. No evidence of acute intracranial abnormality.  2. Generalized parenchymal atrophy with redemonstrated chronic left  insular and right thalamic infarcts.      4:01 PM Spoke to daughter Jenny Reichmann at [phone] who was informed of mothers condition and plan.    Patient signed out to incoming provider pending rest of labs along with MRI Brain.   Portions of this note were generated with [employer]. Dictation errors may occur despite best attempts at proofreading. Final Clinical Impressions(s) / ED Diagnoses   Final diagnoses:  Weakness  Acute cystitis with hematuria    ED Discharge Orders         Ordered     cephALEXin (KEFLEX) 500 MG capsule  2 times daily     04/19/19 1608           Janeece Fitting, PA-C 04/19/19 1609    Ezequiel Essex, MD 04/20/19 0136

## 2019-04-20 NOTE — ED Notes (Signed)
[facility] staff reports keflex prescription was sent to the wrong pharmacy.  Called in prescription as written to [facility] as requested by staff.

## 2019-04-21 LAB — URINE CULTURE: Culture: >=100000 — AB

## 2019-04-22 ENCOUNTER — Telehealth: Payer: Self-pay | Admitting: *Deleted

## 2019-04-22 NOTE — Progress Notes (Signed)
ED Antimicrobial Stewardship Positive Culture Follow Up   Kimberly [name] is an 68 y.o. female who presented to [facility] on 04/19/2019 with a chief complaint of  Chief Complaint  Patient presents with  . Weakness    Recent Results (from the past 720 hour(s))  Urine culture     Status: Abnormal   Collection Time: 04/19/19  2:37 PM   Specimen: Urine, Clean Catch  Result Value Ref Range Status   Specimen Description   Final    URINE, CLEAN CATCH Performed at [facility], [address], [place], [place] [address]    Special Requests   Final    NONE Performed at [facility], [address], [place], [place] [address]    Culture (A)  Final    >=100,000 COLONIES/mL ESCHERICHIA COLI Confirmed Extended Spectrum Beta-Lactamase Producer (ESBL).  In bloodstream infections from ESBL organisms, carbapenems are preferred over piperacillin/tazobactam. They are shown to have a lower risk of mortality.    Report Status 04/21/2019 FINAL  Final   Organism ID, Bacteria ESCHERICHIA COLI (A)  Final      Susceptibility   Escherichia coli - MIC*    AMPICILLIN >=32 RESISTANT Resistant     CEFAZOLIN >=64 RESISTANT Resistant     CEFTRIAXONE >=64 RESISTANT Resistant     CIPROFLOXACIN >=4 RESISTANT Resistant     GENTAMICIN <=1 SENSITIVE Sensitive     IMIPENEM <=0.25 SENSITIVE Sensitive     NITROFURANTOIN <=16 SENSITIVE Sensitive     TRIMETH/SULFA >=320 RESISTANT Resistant     AMPICILLIN/SULBACTAM >=32 RESISTANT Resistant     PIP/TAZO 64 INTERMEDIATE Intermediate     Extended ESBL POSITIVE Resistant     * >=100,000 COLONIES/mL ESCHERICHIA COLI  Blood culture (routine x 2)     Status: None (Preliminary result)   Collection Time: 04/19/19  4:45 PM   Specimen: BLOOD RIGHT HAND  Result Value Ref Range Status   Specimen Description BLOOD RIGHT HAND  Final   Special Requests   Final    Blood Culture adequate volume BOTTLES DRAWN AEROBIC AND ANAEROBIC   Culture   Final    NO GROWTH 3 DAYS Performed at  [facility], [address] [place], [place] [address]    Report Status PENDING  Incomplete  Blood culture (routine x 2)     Status: None (Preliminary result)   Collection Time: 04/19/19  4:48 PM   Specimen: BLOOD LEFT HAND  Result Value Ref Range Status   Specimen Description BLOOD LEFT HAND  Final   Special Requests   Final    BOTTLES DRAWN AEROBIC ONLY Blood Culture adequate volume   Culture   Final    NO GROWTH 3 DAYS Performed at [facility], [address] [place], [place] [address]    Report Status PENDING  Incomplete    [x]  Treated with cephalexin, organism resistant to prescribed antimicrobial []  Patient discharged originally without antimicrobial agent and treatment is now indicated  New antibiotic prescription: DC cephalexin, start macrobid 100mg  PO BID x 5 days  ED Provider: Alyse Low, PA   Doral Ventrella, Rande Lawman 04/22/2019, 9:24 AM Clinical Pharmacist Monday - Friday phone -  [phone] Saturday - Sunday phone - [phone]

## 2019-04-22 NOTE — Telephone Encounter (Signed)
Post ED Visit - Positive Culture Follow-up: Successful Patient Follow-Up  Culture assessed and recommendations reviewed by:  []  Elenor Quinones, Pharm.D. []  Heide Guile, Pharm.D., BCPS AQ-ID []  Parks Neptune, Pharm.D., BCPS []  Alycia Rossetti, Pharm.D., BCPS []  [place], Pharm.D., BCPS, AAHIVP []  Legrand Como, Pharm.D., BCPS, AAHIVP [x]  Salome Arnt, PharmD, BCPS []  Johnnette Gourd, PharmD, BCPS []  Hughes Better, PharmD, BCPS []  Leeroy Cha, PharmD  Positive urine culture  []  Patient discharged without antimicrobial prescription and treatment is now indicated [x]  Organism is resistant to prescribed ED discharge antimicrobial []  Patient with positive blood cultures  Changes discussed with ED provider Alyse Low, PA New antibiotic prescription Macrobid 100mg  PO BIDx 5 days Called to [facility], Linna Hoff [phone]  Contacted patient, date 04/22/2019 time 0950 Results faxed to [facility], [place]  Ardeen Fillers 04/22/2019, 9:52 AM

## 2019-04-25 LAB — CULTURE, BLOOD (ROUTINE X 2)
Culture: NO GROWTH
Culture: NO GROWTH
Special Requests: ADEQUATE
Special Requests: ADEQUATE

## 2019-05-27 DIAGNOSIS — Z466 Encounter for fitting and adjustment of urinary device: Secondary | ICD-10-CM | POA: Insufficient documentation

## 2019-06-28 ENCOUNTER — Encounter: Payer: Self-pay | Admitting: Gastroenterology

## 2019-09-12 ENCOUNTER — Emergency Department (HOSPITAL_COMMUNITY)
Admission: EM | Admit: 2019-09-12 | Discharge: 2019-09-12 | Disposition: A | Discharge status: Home / Self Care | Payer: Medicare Other | Attending: Emergency Medicine | Admitting: Emergency Medicine

## 2019-09-12 ENCOUNTER — Encounter (HOSPITAL_COMMUNITY): Payer: Self-pay

## 2019-09-12 DIAGNOSIS — Z8673 Personal history of transient ischemic attack (TIA), and cerebral infarction without residual deficits: Secondary | ICD-10-CM | POA: Insufficient documentation

## 2019-09-12 DIAGNOSIS — Z79899 Other long term (current) drug therapy: Secondary | ICD-10-CM | POA: Diagnosis not present

## 2019-09-12 DIAGNOSIS — Z466 Encounter for fitting and adjustment of urinary device: Secondary | ICD-10-CM | POA: Diagnosis not present

## 2019-09-12 DIAGNOSIS — E039 Hypothyroidism, unspecified: Secondary | ICD-10-CM | POA: Diagnosis not present

## 2019-09-12 DIAGNOSIS — F0390 Unspecified dementia without behavioral disturbance: Secondary | ICD-10-CM | POA: Insufficient documentation

## 2019-09-12 DIAGNOSIS — N390 Urinary tract infection, site not specified: Secondary | ICD-10-CM | POA: Diagnosis not present

## 2019-09-12 DIAGNOSIS — Z87891 Personal history of nicotine dependence: Secondary | ICD-10-CM | POA: Insufficient documentation

## 2019-09-12 DIAGNOSIS — I5032 Chronic diastolic (congestive) heart failure: Secondary | ICD-10-CM | POA: Diagnosis not present

## 2019-09-12 DIAGNOSIS — R1030 Lower abdominal pain, unspecified: Secondary | ICD-10-CM | POA: Diagnosis present

## 2019-09-12 DIAGNOSIS — I110 Hypertensive heart disease with heart failure: Secondary | ICD-10-CM | POA: Insufficient documentation

## 2019-09-12 HISTORY — DX: Methicillin resistant Staphylococcus aureus infection as the cause of diseases classified elsewhere: B95.62

## 2019-09-12 HISTORY — DX: Bacteremia: R78.81

## 2019-09-12 HISTORY — DX: Unspecified protein-calorie malnutrition: E46

## 2019-09-12 LAB — COMPREHENSIVE METABOLIC PANEL WITH GFR
ALT: 12 U/L (ref 0–44)
AST: 16 U/L (ref 15–41)
Albumin: 3.1 g/dL — ABNORMAL LOW (ref 3.5–5.0)
Alkaline Phosphatase: 67 U/L (ref 38–126)
Anion gap: 10 (ref 5–15)
BUN: 18 mg/dL (ref 8–23)
CO2: 26 mmol/L (ref 22–32)
Calcium: 8.6 mg/dL — ABNORMAL LOW (ref 8.9–10.3)
Chloride: 102 mmol/L (ref 98–111)
Creatinine, Ser: 0.76 mg/dL (ref 0.44–1.00)
GFR calc Af Amer: >60 mL/min
GFR calc non Af Amer: >60 mL/min
Glucose, Bld: 91 mg/dL (ref 70–99)
Potassium: 4 mmol/L (ref 3.5–5.1)
Sodium: 138 mmol/L (ref 135–145)
Total Bilirubin: 0.4 mg/dL (ref 0.3–1.2)
Total Protein: 6.5 g/dL (ref 6.5–8.1)

## 2019-09-12 LAB — URINALYSIS, ROUTINE W REFLEX MICROSCOPIC
Bilirubin Urine: NEGATIVE
Glucose, UA: NEGATIVE mg/dL
Ketones, ur: NEGATIVE mg/dL
Nitrite: NEGATIVE
Protein, ur: >=300 mg/dL — AB
Specific Gravity, Urine: 1.015 (ref 1.005–1.030)
WBC, UA: >50 WBC/hpf — ABNORMAL HIGH (ref 0–5)
pH: 9 — ABNORMAL HIGH (ref 5.0–8.0)

## 2019-09-12 LAB — CBC WITH DIFFERENTIAL/PLATELET
Abs Immature Granulocytes: 0.03 K/uL (ref 0.00–0.07)
Basophils Absolute: 0.1 K/uL (ref 0.0–0.1)
Basophils Relative: 1 %
Eosinophils Absolute: 0.2 K/uL (ref 0.0–0.5)
Eosinophils Relative: 2 %
HCT: 39 % (ref 36.0–46.0)
Hemoglobin: 11.9 g/dL — ABNORMAL LOW (ref 12.0–15.0)
Immature Granulocytes: 0 %
Lymphocytes Relative: 28 %
Lymphs Abs: 3 K/uL (ref 0.7–4.0)
MCH: 27.5 pg (ref 26.0–34.0)
MCHC: 30.5 g/dL (ref 30.0–36.0)
MCV: 90.1 fL (ref 80.0–100.0)
Monocytes Absolute: 0.9 K/uL (ref 0.1–1.0)
Monocytes Relative: 9 %
Neutro Abs: 6.5 K/uL (ref 1.7–7.7)
Neutrophils Relative %: 60 %
Platelets: 505 K/uL — ABNORMAL HIGH (ref 150–400)
RBC: 4.33 MIL/uL (ref 3.87–5.11)
RDW: 13.8 % (ref 11.5–15.5)
WBC: 10.7 K/uL — ABNORMAL HIGH (ref 4.0–10.5)
nRBC: 0 % (ref 0.0–0.2)

## 2019-09-12 LAB — LIPASE, BLOOD: Lipase: 29 U/L (ref 11–51)

## 2019-09-12 MED ORDER — SODIUM CHLORIDE 0.9 % IV SOLN
1.0000 g | Freq: Once | INTRAVENOUS | Status: AC
  Administered 2019-09-12: 1 g via INTRAVENOUS
  Filled 2019-09-12: qty 10

## 2019-09-12 MED ORDER — CEPHALEXIN 500 MG PO CAPS: 500.0000 mg | ORAL_CAPSULE | Freq: Three times a day (TID) | ORAL | 0 refills | Status: DC

## 2019-09-12 NOTE — ED Triage Notes (Signed)
EMS reports pt from [facility].  Staff says pt has had discolored, cloudy, and foul smelling urine for past few days.  Reports Malaise since Thursday or Fri.  EMS says staff sent urine sample for testing but doesn't have the results.  Reports pt alert and oriented and c/o abd pain x 2 days.

## 2019-09-12 NOTE — ED Notes (Signed)
EMS sceduler called for transport to [facility].

## 2019-09-12 NOTE — ED Notes (Signed)
[facility] called, reports given to [employer], reviewed d/c and follow up care, pt to be d/c

## 2019-09-12 NOTE — ED Provider Notes (Signed)
[facility] EMERGENCY DEPARTMENT Provider Note   CSN: [id number] Arrival date & time: 09/12/19  2956     History Chief Complaint  Patient presents with  . Abdominal Pain    Kimberly [name] is a 69 y.o. female.  HPI   69 year old female with abdominal pain.  Worsening the past day or so.  She does have a chronic indwelling Foley catheter.  She is unsure the last time was changed.  Pain is in lower abdomen.  No fevers or chills.  No vomiting or diarrhea.  Past Medical History:  Diagnosis Date  . Abnormality of gait 08/02/2013  . Anemia   . Arthritis    osteoarthritis  . Blind   . CHF (congestive heart failure) ([place])   . COPD (chronic obstructive pulmonary disease) ([place])   . DDD (degenerative disc disease)   . Depression   . Dysarthria   . HOH (hard of hearing)   . Hyperlipidemia   . Hyperlipidemia   . Hypertension   . Hypothyroidism   . Memory disorder 08/02/2013  . MRSA bacteremia   . Pneumonia   . Protein calorie malnutrition ([place])   . Renal disorder   . Right hemiparesis ([place])   . Sepsis ([place])   . Sepsis ([place])   . Shortness of breath   . Stroke ([place])   . Tobacco abuse 11/02/2013  . Urinary retention     Patient Active Problem List   Diagnosis Date Noted  . Protein-calorie malnutrition, severe 04/23/2018  . Hydroureteronephrosis 04/19/2018  . ARF (acute renal failure) ([place]) 11/24/2017  . MRSA bacteremia 12/31/2016  . Sepsis secondary to UTI ([place]) 12/30/2016  . Urinary retention 07/18/2016  . Essential hypertension 07/18/2016  . Hyperlipidemia 07/18/2016  . Ischemic stroke ([place]) - L insular embolic infarct s/p tPA, source unknown 07/14/2016  . Acute right hemiparesis ([place])   . Dysarthria   . Dementia without behavioral disturbance ([place])   . Syncope 11/02/2013  . Syncope and collapse 11/02/2013  . Blind 11/02/2013  . Hypothyroidism 11/02/2013  . History of CVA (cerebrovascular accident) 11/02/2013  . Bradycardia 11/02/2013  . Tobacco abuse 11/02/2013    . Memory disorder 08/02/2013  . Abnormality of gait 08/02/2013  . Chronic diastolic heart failure ([place]) 12/19/2011  . ANEMIA 05/04/2009  . LEUKOCYTOSIS UNSPECIFIED 05/04/2009  . 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 ABDOMINAL PAIN, LOWER 05/04/2009    Past Surgical History:  Procedure Laterality Date  . ARM DEBRIDEMENT     as child  . CATARACT EXTRACTION W/PHACO  04/28/2011   Procedure: CATARACT EXTRACTION PHACO AND INTRAOCULAR LENS PLACEMENT (IOC);  Surgeon: Williams Che;  Location: [facility];  Service: Ophthalmology;  Laterality: Left;  CDE: 5.37  . CESAREAN SECTION    . 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 STRABISMUS SURGERY     age 88  . TEE WITHOUT CARDIOVERSION N/A 01/02/2017   Procedure: TRANSESOPHAGEAL ECHOCARDIOGRAM (TEE);  Surgeon: Arnoldo Lenis, MD;  Location: [facility] END[facility] SUITE;  Service: Endoscopy;  Laterality: N/A;     OB History    Gravida  2   Para  2   Term  2   Preterm      AB      Living  2     SAB      TAB      Ectopic      Multiple      Live Births  Family History  Problem Relation Age of Onset  . Diabetes Brother   . Diabetes Brother   . Diabetes Mother   . Cancer - Lung Father   . Colon cancer Other        neg hx?     Social History   Tobacco Use  . Smoking status: Former Smoker    Packs/day: 0.50    Types: Cigarettes  . Smokeless tobacco: Never Used  Substance Use Topics  . Alcohol use: No  . Drug use: No    Home Medications Prior to Admission medications   Medication Sig Start Date End Date Taking? Authorizing Provider  acetaminophen (TYLENOL) 650 MG CR tablet Take 650 mg by mouth 2 (two) times daily.    [provider]  ARIPiprazole (ABILIFY) 2 MG tablet Take 2 mg by mouth at bedtime.    [provider]  atorvastatin (LIPITOR) 10 MG tablet Take 10 mg by mouth every evening.     [provider]  cephALEXin (KEFLEX) 500 MG capsule Take 1 capsule (500 mg total) by mouth 3 (three) times daily. 09/12/19   Raeford Razor, MD  cholecalciferol (VITAMIN D) 400 UNITS TABS Take 800 Units by mouth daily.      [provider]  donepezil (ARICEPT) 5 MG tablet TAKE 1 TABLET BY MOUTH AT BEDTIME. Patient taking differently: Take 5 mg by mouth at bedtime.  01/15/15   York Spaniel, MD  feeding supplement, ENSURE ENLIVE, (ENSURE ENLIVE) LIQD Take 237 mLs by mouth 2 (two) times daily between meals. 04/24/18   Calvert Cantor, MD  levothyroxine (SYNTHROID, LEVOTHROID) 25 MCG tablet Take 25 mcg by mouth daily.      [provider]  LORazepam (ATIVAN) 0.5 MG tablet Take 0.5 mg by mouth 2 (two) times daily.     [provider]  memantine (NAMENDA) 10 MG tablet Take 10 mg by mouth 2 (two) times daily.    [provider]  mirtazapine (REMERON) 15 MG tablet Take 7.5 mg by mouth at bedtime.  08/09/16   [provider]  Multiple Vitamin (DAILY-VITE PO) Take 1 tablet by mouth daily.    [provider]  Multiple Vitamins-Minerals (ICAPS AREDS FORMULA PO) Take 1 tablet by mouth daily.    [provider]  sertraline (ZOLOFT) 50 MG tablet Take 75 mg by mouth at bedtime.     [provider]  Vitamins A & D (VITAMIN A & D) ointment Apply 1 application topically 2 (two) times daily. Skin is washed, dried, then applied twice daily per [facility]    [provider]    Allergies    Patient has no known allergies.  Review of Systems   Review of Systems All systems reviewed and negative, other than as noted in HPI.  Physical Exam Updated Vital Signs BP 104/60   Pulse 84   Temp 99.1 F (37.3 C) (Oral)   Resp 20   SpO2 91%   Physical Exam Vitals and nursing note reviewed.  Constitutional:      General: She is not in acute distress.    Appearance: She is well-developed.  HENT:     Head: Normocephalic and atraumatic.  Eyes:       General:        Right eye: No discharge.        Left eye: No discharge.     Conjunctiva/sclera: Conjunctivae normal.  Cardiovascular:     Rate and Rhythm: Normal rate and regular rhythm.  Heart sounds: Normal heart sounds. No murmur. No friction rub. No gallop.   Pulmonary:     Effort: Pulmonary effort is normal. No respiratory distress.     Breath sounds: Normal breath sounds.  Abdominal:     General: There is no distension.     Palpations: Abdomen is soft.     Tenderness: There is abdominal tenderness.     Comments: Lower abdominal tenderness without rebound or guarding.  No distention.  Foley catheter in place.  Scant urine in bag.  Musculoskeletal:        General: No tenderness.     Cervical back: Neck supple.  Skin:    General: Skin is warm and dry.  Neurological:     Mental Status: She is alert.  Psychiatric:        Behavior: Behavior normal.        Thought Content: Thought content normal.     ED Results / Procedures / Treatments   Labs (all labs ordered are listed, but only abnormal results are displayed) Labs Reviewed  URINALYSIS, ROUTINE W REFLEX MICROSCOPIC - Abnormal; Notable for the following components:      Result Value   APPearance TURBID (*)    pH 9.0 (*)    Hgb urine dipstick SMALL (*)    Protein, ur >=300 (*)    Leukocytes,Ua MODERATE (*)    WBC, UA >50 (*)    Bacteria, UA MANY (*)    All other components within normal limits  CBC WITH DIFFERENTIAL/PLATELET - Abnormal; Notable for the following components:   WBC 10.7 (*)    Hemoglobin 11.9 (*)    Platelets 505 (*)    All other components within normal limits  COMPREHENSIVE METABOLIC PANEL - Abnormal; Notable for the following components:   Calcium 8.6 (*)    Albumin 3.1 (*)    All other components within normal limits  LIPASE, BLOOD    EKG EKG Interpretation  Date/Time:  Monday September 12 2019 09:52:57 EDT Ventricular Rate:  89 PR Interval:    QRS Duration: 82 QT Interval:  359 QTC  Calculation: 437 R Axis:   51 Text Interpretation: Sinus rhythm Left atrial enlargement Minimal ST depression Artifact in lead(s) I III aVR aVL aVF V1 V2 V3 Confirmed by Raeford Razor [phone]) on 09/12/2019 11:23:40 AM   Radiology No results found.  Procedures Procedures (including critical care time)  Medications Ordered in ED Medications  cefTRIAXone (ROCEPHIN) 1 g in sodium chloride 0.9 % 100 mL IVPB (has no administration in time range)    ED Course  I have reviewed the triage vital signs and the nursing notes.  Pertinent labs & imaging results that were available during my care of the patient were reviewed by me and considered in my medical decision making (see chart for details).    MDM Rules/Calculators/A&P                      69 year old female with lower abdominal pain.  Suspect from UTI.  Chronic Foley but given symptoms, will treat.  She is afebrile.  Nontoxic.  I feel she is appropriate for outpatient treatment. Final Clinical Impression(s) / ED Diagnoses Final diagnoses:  Lower urinary tract infectious disease    Rx / DC Orders ED Discharge Orders         Ordered    cephALEXin (KEFLEX) 500 MG capsule  3 times daily     09/12/19 1405  Raeford Razor, MD 09/14/19 [phone]

## 2020-01-13 ENCOUNTER — Emergency Department (HOSPITAL_COMMUNITY)
Admission: EM | Admit: 2020-01-13 | Discharge: 2020-01-14 | Disposition: A | Discharge status: Home / Self Care | Payer: Medicare Other | Attending: Emergency Medicine | Admitting: Emergency Medicine

## 2020-01-13 ENCOUNTER — Other Ambulatory Visit: Payer: Self-pay

## 2020-01-13 DIAGNOSIS — I5032 Chronic diastolic (congestive) heart failure: Secondary | ICD-10-CM | POA: Diagnosis not present

## 2020-01-13 DIAGNOSIS — F0390 Unspecified dementia without behavioral disturbance: Secondary | ICD-10-CM | POA: Insufficient documentation

## 2020-01-13 DIAGNOSIS — J449 Chronic obstructive pulmonary disease, unspecified: Secondary | ICD-10-CM | POA: Insufficient documentation

## 2020-01-13 DIAGNOSIS — E039 Hypothyroidism, unspecified: Secondary | ICD-10-CM | POA: Insufficient documentation

## 2020-01-13 DIAGNOSIS — N390 Urinary tract infection, site not specified: Secondary | ICD-10-CM

## 2020-01-13 DIAGNOSIS — Z20822 Contact with and (suspected) exposure to covid-19: Secondary | ICD-10-CM | POA: Diagnosis not present

## 2020-01-13 DIAGNOSIS — R4182 Altered mental status, unspecified: Secondary | ICD-10-CM | POA: Diagnosis present

## 2020-01-13 DIAGNOSIS — Z79899 Other long term (current) drug therapy: Secondary | ICD-10-CM | POA: Diagnosis not present

## 2020-01-13 DIAGNOSIS — I110 Hypertensive heart disease with heart failure: Secondary | ICD-10-CM | POA: Diagnosis not present

## 2020-01-13 DIAGNOSIS — Z79890 Hormone replacement therapy (postmenopausal): Secondary | ICD-10-CM | POA: Diagnosis not present

## 2020-01-13 DIAGNOSIS — Z87891 Personal history of nicotine dependence: Secondary | ICD-10-CM | POA: Insufficient documentation

## 2020-01-13 LAB — CBC
HCT: 40.6 % (ref 36.0–46.0)
Hemoglobin: 12 g/dL (ref 12.0–15.0)
MCH: 27.3 pg (ref 26.0–34.0)
MCHC: 29.6 g/dL — ABNORMAL LOW (ref 30.0–36.0)
MCV: 92.5 fL (ref 80.0–100.0)
Platelets: 390 10*3/uL (ref 150–400)
RBC: 4.39 MIL/uL (ref 3.87–5.11)
RDW: 14.1 % (ref 11.5–15.5)
WBC: 9.8 10*3/uL (ref 4.0–10.5)
nRBC: 0 % (ref 0.0–0.2)

## 2020-01-13 LAB — COMPREHENSIVE METABOLIC PANEL
ALT: 11 U/L (ref 0–44)
AST: 15 U/L (ref 15–41)
Albumin: 3.3 g/dL — ABNORMAL LOW (ref 3.5–5.0)
Alkaline Phosphatase: 66 U/L (ref 38–126)
Anion gap: 9 (ref 5–15)
BUN: 23 mg/dL (ref 8–23)
CO2: 25 mmol/L (ref 22–32)
Calcium: 8.8 mg/dL — ABNORMAL LOW (ref 8.9–10.3)
Chloride: 107 mmol/L (ref 98–111)
Creatinine, Ser: 0.6 mg/dL (ref 0.44–1.00)
GFR calc Af Amer: >60 mL/min (ref >60)
GFR calc non Af Amer: >60 mL/min (ref >60)
Glucose, Bld: 125 mg/dL — ABNORMAL HIGH (ref 70–99)
Potassium: 3.5 mmol/L (ref 3.5–5.1)
Sodium: 141 mmol/L (ref 135–145)
Total Bilirubin: 0.2 mg/dL — ABNORMAL LOW (ref 0.3–1.2)
Total Protein: 6.5 g/dL (ref 6.5–8.1)

## 2020-01-13 LAB — SARS CORONAVIRUS 2 BY RT PCR (HOSPITAL ORDER, PERFORMED IN ~~LOC~~ HOSPITAL LAB): SARS Coronavirus 2: NEGATIVE

## 2020-01-13 MED ORDER — SODIUM CHLORIDE 0.9 % IV BOLUS
1000.0000 mL | Freq: Once | INTRAVENOUS | Status: AC
  Administered 2020-01-14: 1000 mL via INTRAVENOUS

## 2020-01-13 NOTE — ED Provider Notes (Signed)
[facility] EMERGENCY DEPARTMENT Provider Note   CSN: [id number] Arrival date & time: 01/13/20  1901     History Chief Complaint  Patient presents with  . Altered Mental Status    Kimberly [name] is a 69 y.o. female.  Patient brought into the emergency department for altered mental status.  The history is provided by medical records. No language interpreter was used.  Altered Mental Status Presenting symptoms: behavior changes and confusion   Severity:  Mild Most recent episode:  Today Episode history:  Multiple Timing:  Constant Progression:  Worsening Chronicity:  New Context: not alcohol use        Past Medical History:  Diagnosis Date  . Abnormality of gait 08/02/2013  . Anemia   . Arthritis    osteoarthritis  . Blind   . CHF (congestive heart failure) (HCC)   . COPD (chronic obstructive pulmonary disease) (HCC)   . DDD (degenerative disc disease)   . Depression   . Dysarthria   . HOH (hard of hearing)   . Hyperlipidemia   . Hyperlipidemia   . Hypertension   . Hypothyroidism   . Memory disorder 08/02/2013  . MRSA bacteremia   . Pneumonia   . Protein calorie malnutrition (HCC)   . Renal disorder   . Right hemiparesis (HCC)   . Sepsis (HCC)   . Sepsis (HCC)   . Shortness of breath   . Stroke (HCC)   . Tobacco abuse 11/02/2013  . Urinary retention     Patient Active Problem List   Diagnosis Date Noted  . Protein-calorie malnutrition, severe 04/23/2018  . Hydroureteronephrosis 04/19/2018  . ARF (acute renal failure) (H[facility]) 11/24/2017  . MRSA bacteremia 12/31/2016  . Sepsis secondary to UTI (HCC) 12/30/2016  . Urinary retention 07/18/2016  . Essential hypertension 07/18/2016  . Hyperlipidemia 07/18/2016  . Ischemic stroke (HCC) - L insular embolic infarct s/p tPA, source unknown 07/14/2016  . Acute right hemiparesis (HCC)   . Dysarthria   . Dementia without behavioral disturbance (HCC)   . Syncope 11/02/2013  . Syncope and collapse 11/02/2013    . Blind 11/02/2013  . Hypothyroidism 11/02/2013  . History of CVA (cerebrovascular accident) 11/02/2013  . Bradycardia 11/02/2013  . Tobacco abuse 11/02/2013  . Memory disorder 08/02/2013  . Abnormality of gait 08/02/2013  . Chronic diastolic heart failure (HCC) 12/19/2011  . ANEMIA 05/04/2009  . LEUKOCYTOSIS UNSPECIFIED 05/04/2009  . 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 ABDOMINAL PAIN, LOWER 05/04/2009    Past Surgical History:  Procedure Laterality Date  . ARM DEBRIDEMENT     as child  . CATARACT EXTRACTION W/PHACO  04/28/2011   Procedure: CATARACT EXTRACTION PHACO AND INTRAOCULAR LENS PLACEMENT (IOC);  Surgeon: Susa Simmonds;  Location: [facility]S;  Service: Ophthalmology;  Laterality: Left;  CDE: 5.37  . CESAREAN SECTION    . 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 STRABISMUS SURGERY     age 60  . TEE WITHOUT CARDIOVERSION N/A 01/02/2017   Procedure: TRANSESOPHAGEAL ECHOCARDIOGRAM (TEE);  Surgeon: Antoine Poche, MD;  Location: [facility];  Service: Endoscopy;  Laterality: N/A;     OB History    Gravida  2   Para  2   Term  2   Preterm      AB      Living  2     SAB      TAB  Ectopic      Multiple      Live Births              Family History  Problem Relation Age of Onset  . Diabetes Brother   . Diabetes Brother   . Diabetes Mother   . Cancer - Lung Father   . Colon cancer Other        neg hx?     Social History   Tobacco Use  . Smoking status: Former Smoker    Packs/day: 0.50    Types: Cigarettes  . Smokeless tobacco: Never Used  Vaping Use  . Vaping Use: Never used  Substance Use Topics  . Alcohol use: No  . Drug use: No    Home Medications Prior to Admission medications   Medication Sig Start Date End Date Taking? Authorizing Provider  acetaminophen (TYLENOL) 650 MG CR tablet Take 650 mg by mouth 2 (two) times daily.     [provider]  ARIPiprazole (ABILIFY) 2 MG tablet Take 2 mg by mouth at bedtime.    [provider]  atorvastatin (LIPITOR) 10 MG tablet Take 10 mg by mouth every evening.     [provider]  cephALEXin (KEFLEX) 500 MG capsule Take 1 capsule (500 mg total) by mouth 3 (three) times daily. 09/12/19   Raeford Razor, MD  cholecalciferol (VITAMIN D) 400 UNITS TABS Take 800 Units by mouth daily.      [provider]  donepezil (ARICEPT) 5 MG tablet TAKE 1 TABLET BY MOUTH AT BEDTIME. Patient taking differently: Take 5 mg by mouth at bedtime.  01/15/15   York Spaniel, MD  feeding supplement, ENSURE ENLIVE, (ENSURE ENLIVE) LIQD Take 237 mLs by mouth 2 (two) times daily between meals. 04/24/18   Calvert Cantor, MD  levothyroxine (SYNTHROID, LEVOTHROID) 25 MCG tablet Take 25 mcg by mouth daily.      [provider]  LORazepam (ATIVAN) 0.5 MG tablet Take 0.5 mg by mouth 2 (two) times daily.     [provider]  memantine (NAMENDA) 10 MG tablet Take 10 mg by mouth 2 (two) times daily.    [provider]  mirtazapine (REMERON) 15 MG tablet Take 7.5 mg by mouth at bedtime.  08/09/16   [provider]  Multiple Vitamin (DAILY-VITE PO) Take 1 tablet by mouth daily.    [provider]  Multiple Vitamins-Minerals (ICAPS AREDS FORMULA PO) Take 1 tablet by mouth daily.    [provider]  sertraline (ZOLOFT) 50 MG tablet Take 75 mg by mouth at bedtime.     [provider]  Vitamins A & D (VITAMIN A & D) ointment Apply 1 application topically 2 (two) times daily. Skin is washed, dried, then applied twice daily per [facility]    [provider]    Allergies    Patient has no known allergies.  Review of Systems   Review of Systems  Unable to perform ROS: Dementia  Psychiatric/Behavioral: Positive for confusion.    Physical Exam Updated Vital Signs BP 113/66 (BP Location: Left Arm)   Pulse 73   Temp 98.4  F (36.9 C) (Oral)   Resp 16   SpO2 94%   Physical Exam Vitals and nursing note reviewed.  Constitutional:      Appearance: She is well-developed.  HENT:     Head: Normocephalic.     Right Ear: Ear canal normal.  Eyes:     General: No scleral icterus.  Conjunctiva/sclera: Conjunctivae normal.  Neck:     Thyroid: No thyromegaly.  Cardiovascular:     Rate and Rhythm: Normal rate and regular rhythm.     Heart sounds: No murmur heard.  No friction rub. No gallop.   Pulmonary:     Breath sounds: No stridor. No wheezing or rales.  Chest:     Chest wall: No tenderness.  Abdominal:     General: There is no distension.     Tenderness: There is no abdominal tenderness. There is no rebound.  Musculoskeletal:        General: Normal range of motion.     Cervical back: Neck supple.  Lymphadenopathy:     Cervical: No cervical adenopathy.  Skin:    Findings: No erythema or rash.  Neurological:     Motor: No abnormal muscle tone.     Coordination: Coordination normal.     Comments: Lethargic     ED Results / Procedures / Treatments   Labs (all labs ordered are listed, but only abnormal results are displayed) Labs Reviewed  COMPREHENSIVE METABOLIC PANEL - Abnormal; Notable for the following components:      Result Value   Glucose, Bld 125 (*)    Calcium 8.8 (*)    Albumin 3.3 (*)    Total Bilirubin 0.2 (*)    All other components within normal limits  CBC - Abnormal; Notable for the following components:   MCHC 29.6 (*)    All other components within normal limits  SARS CORONAVIRUS 2 BY RT PCR (HOSPITAL ORDER, PERFORMED IN [facility])  URINALYSIS, ROUTINE W REFLEX MICROSCOPIC    EKG None  Radiology No results found.  Procedures Procedures (including critical care time)  Medications Ordered in ED Medications  sodium chloride 0.9 % bolus 1,000 mL (has no administration in time range)    ED Course  I have reviewed the triage vital signs and the  nursing notes.  Pertinent labs & imaging results that were available during my care of the patient were reviewed by me and considered in my medical decision making (see chart for details).    MDM Rules/Calculators/A&P                          Patient with altered mental status.  Urinalysis pending. Final Clinical Impression(s) / ED Diagnoses Final diagnoses:  None    Rx / DC Orders ED Discharge Orders    None       Bethann Berkshire, MD 01/16/20 1139

## 2020-01-13 NOTE — ED Triage Notes (Signed)
Emergency Medicine Provider Triage Evaluation Note     Clinical Impression     Bethann Berkshire, MD 01/16/20 [phone]

## 2020-01-13 NOTE — ED Triage Notes (Addendum)
Patient is here from [facility] via [facility] for altered mental status. Patient has a foley cath with dark urine in the drainage bag.

## 2020-01-14 ENCOUNTER — Emergency Department (HOSPITAL_COMMUNITY): Payer: Medicare Other

## 2020-01-14 DIAGNOSIS — R4182 Altered mental status, unspecified: Secondary | ICD-10-CM | POA: Diagnosis not present

## 2020-01-14 LAB — URINALYSIS, ROUTINE W REFLEX MICROSCOPIC
Bilirubin Urine: NEGATIVE
Glucose, UA: NEGATIVE mg/dL
Ketones, ur: NEGATIVE mg/dL
Nitrite: POSITIVE — AB
Protein, ur: >300 mg/dL — AB
Specific Gravity, Urine: <1.005 — ABNORMAL LOW (ref 1.005–1.030)
pH: >9 — ABNORMAL HIGH (ref 5.0–8.0)

## 2020-01-14 LAB — URINALYSIS, MICROSCOPIC (REFLEX)

## 2020-01-14 MED ORDER — NITROFURANTOIN MONOHYD MACRO 100 MG PO CAPS
100.0000 mg | ORAL_CAPSULE | Freq: Once | ORAL | Status: AC
  Administered 2020-01-14: 100 mg via ORAL
  Filled 2020-01-14: qty 1

## 2020-01-14 MED ORDER — NITROFURANTOIN MONOHYD MACRO 100 MG PO CAPS: 100.0000 mg | ORAL_CAPSULE | Freq: Two times a day (BID) | ORAL | 0 refills | Status: AC

## 2020-01-14 NOTE — ED Provider Notes (Signed)
Patient was left at change of shift to get results of her urinalysis and for her to get a liter of fluids.  She also had a head CT pending.  Urine culture November showed over 100,000 colonies of E. coli however it was resistant to multiple antibiotics and was only sensitive to oral Macrobid.  Patient was started on oral Macrobid.   Patient was able to take her Macrobid orally.  She got most of her IV fluids before she accidentally pulled out her IV.  Nursing staff  talked to her facility and they state they are willing to take her back and give her her oral antibiotics.   Results for orders placed or performed during the hospital encounter of 01/13/20  SARS Coronavirus 2 by RT PCR (hospital order, performed in [facility] hospital lab) Nasopharyngeal Nasopharyngeal Swab   Specimen: Nasopharyngeal Swab  Result Value Ref Range   SARS Coronavirus 2 NEGATIVE NEGATIVE  Comprehensive metabolic panel  Result Value Ref Range   Sodium 141 135 - 145 mmol/L   Potassium 3.5 3.5 - 5.1 mmol/L   Chloride 107 98 - 111 mmol/L   CO2 25 22 - 32 mmol/L   Glucose, Bld 125 (H) 70 - 99 mg/dL   BUN 23 8 - 23 mg/dL   Creatinine, Ser 3.34 0.44 - 1.00 mg/dL   Calcium 8.8 (L) 8.9 - 10.3 mg/dL   Total Protein 6.5 6.5 - 8.1 g/dL   Albumin 3.3 (L) 3.5 - 5.0 g/dL   AST 15 15 - 41 U/L   ALT 11 0 - 44 U/L   Alkaline Phosphatase 66 38 - 126 U/L   Total Bilirubin 0.2 (L) 0.3 - 1.2 mg/dL   GFR calc non Af Amer >60 >60 mL/min   GFR calc Af Amer >60 >60 mL/min   Anion gap 9 5 - 15  CBC  Result Value Ref Range   WBC 9.8 4.0 - 10.5 K/uL   RBC 4.39 3.87 - 5.11 MIL/uL   Hemoglobin 12.0 12.0 - 15.0 g/dL   HCT 35.6 36 - 46 %   MCV 92.5 80.0 - 100.0 fL   MCH 27.3 26.0 - 34.0 pg   MCHC 29.6 (L) 30.0 - 36.0 g/dL   RDW 86.1 68.3 - 72.9 %   Platelets 390 150 - 400 K/uL   nRBC 0.0 0.0 - 0.2 %  Urinalysis, Routine w reflex microscopic Urine, Catheterized  Result Value Ref Range   Color, Urine AMBER (A) YELLOW    APPearance CLOUDY (A) CLEAR   Specific Gravity, Urine <1.005 (L) 1.005 - 1.030   pH >9.0 (H) 5.0 - 8.0   Glucose, UA NEGATIVE NEGATIVE mg/dL   Hgb urine dipstick TRACE (A) NEGATIVE   Bilirubin Urine NEGATIVE NEGATIVE   Ketones, ur NEGATIVE NEGATIVE mg/dL   Protein, ur >021 (A) NEGATIVE mg/dL   Nitrite POSITIVE (A) NEGATIVE   Leukocytes,Ua LARGE (A) NEGATIVE  Urinalysis, Microscopic (reflex)  Result Value Ref Range   RBC / HPF 0-5 0 - 5 RBC/hpf   WBC, UA 6-10 0 - 5 WBC/hpf   Bacteria, UA MANY (A) NONE SEEN   Squamous Epithelial / LPF 0-5 0 - 5   Triple Phosphate Crystal TRIPLE PHOSPHATE CRYSTALS    Laboratory interpretation all normal except probable UTI, urine culture sent.   CT Head Wo Contrast  Result Date: 01/14/2020 CLINICAL DATA:  Altered mental status EXAM: CT HEAD WITHOUT CONTRAST TECHNIQUE: Contiguous axial images were obtained from the base of the skull through the  vertex without intravenous contrast. COMPARISON:  CT 04/19/2019 FINDINGS: Brain: Stable appearance of likely remote lacunar infarcts in the left pons and right thalamus. No evidence of acute infarction, hemorrhage, hydrocephalus, extra-axial collection or mass lesion/mass effect. Symmetric prominence of the ventricles, cisterns and sulci compatible with parenchymal volume loss. Patchy areas of white matter hypoattenuation are most compatible with chronic microvascular angiopathy. Vascular: Atherosclerotic calcification of the carotid siphons. No hyperdense vessel. Skull: No calvarial fracture or suspicious osseous lesion. Few prominent venous lakes, stable from prior. Benign dermal calcifications. No scalp swelling or hematoma. Sinuses/Orbits: Paranasal sinuses and mastoid air cells are predominantly clear. Prior left lens extraction and senescent scleral plaque. Orbits are otherwise unremarkable. Other: None IMPRESSION: 1. No acute intracranial abnormality. 2. Stable remote lacunar infarcts in the left pons and right  thalamus. 3. Chronic microvascular angiopathy and parenchymal volume loss. Electronically Signed   By: Kreg Shropshire M.D.   On: 01/14/2020 01:55       April 21, 2019  Specimen Description URINE, CLEAN CATCH  Performed at [facility], [address], [place], [place] [address]   Special Requests NONE  Performed at [facility], [address] [place], [place] [address]   Culture Abnormal >=100,000 COLONIES/mL ESCHERICHIA COLI  Confirmed Extended Spectrum Beta-Lactamase Producer (ESBL). In bloodstream infections from ESBL organisms, carbapenems are preferred over piperacillin/tazobactam. They are shown to have a lower risk of mortality.    Report Status 04/21/2019 FINAL   Organism ID, Bacteria ESCHERICHIA COLIAbnormal   Resulting Agency [facility]  Susceptibility   Escherichia coli    MIC    AMPICILLIN >=32 RESIST... Resistant    AMPICILLIN/SULBACTAM >=32 RESIST... Resistant    CEFAZOLIN >=64 RESIST... Resistant    CEFTRIAXONE >=64 RESIST... Resistant    CIPROFLOXACIN >=4 RESISTANT  Resistant    Extended ESBL POSITIVE  Resistant    GENTAMICIN <=1 SENSITIVE  Sensitive    IMIPENEM <=0.25 SENS... Sensitive    NITROFURANTOIN <=16 SENSIT... Sensitive    PIP/TAZO 64 INTERMED... Intermediate    TRIMETH/SULFA >=320 RESIS... Resistant         Susceptibility Comments  Escherichia coli  >=100,000 COLONIES/mL ESCHERICHIA COLI     Diagnoses that have been ruled out:  None  Diagnoses that are still under consideration:  None  Final diagnoses:  Urinary tract infection without hematuria, site unspecified   ED Discharge Orders         Ordered    nitrofurantoin, macrocrystal-monohydrate, (MACROBID) 100 MG capsule  2 times daily        01/14/20 0423           Plan discharge  Devoria Albe, MD, Concha Pyo, MD 01/14/20 [phone]

## 2020-01-14 NOTE — Discharge Instructions (Signed)
Give her the antibiotics until gone.  Please check the results of her urine culture in 2 to 3 days.  Return if she seems worse.

## 2020-01-14 NOTE — ED Notes (Signed)
Report given to [facility] staff.

## 2020-01-15 LAB — URINE CULTURE

## 2020-04-12 DIAGNOSIS — D510 Vitamin B12 deficiency anemia due to intrinsic factor deficiency: Secondary | ICD-10-CM | POA: Insufficient documentation

## 2020-05-13 ENCOUNTER — Other Ambulatory Visit: Payer: Self-pay

## 2020-05-13 ENCOUNTER — Emergency Department (HOSPITAL_COMMUNITY)
Admission: EM | Admit: 2020-05-13 | Discharge: 2020-05-13 | Disposition: A | Discharge status: Home / Self Care | Payer: Medicare Other | Attending: Emergency Medicine | Admitting: Emergency Medicine

## 2020-05-13 ENCOUNTER — Encounter (HOSPITAL_COMMUNITY): Payer: Self-pay | Admitting: *Deleted

## 2020-05-13 DIAGNOSIS — Z466 Encounter for fitting and adjustment of urinary device: Secondary | ICD-10-CM | POA: Diagnosis not present

## 2020-05-13 DIAGNOSIS — Z79899 Other long term (current) drug therapy: Secondary | ICD-10-CM | POA: Insufficient documentation

## 2020-05-13 DIAGNOSIS — E039 Hypothyroidism, unspecified: Secondary | ICD-10-CM | POA: Insufficient documentation

## 2020-05-13 DIAGNOSIS — T839XXA Unspecified complication of genitourinary prosthetic device, implant and graft, initial encounter: Secondary | ICD-10-CM

## 2020-05-13 DIAGNOSIS — Z87891 Personal history of nicotine dependence: Secondary | ICD-10-CM | POA: Insufficient documentation

## 2020-05-13 DIAGNOSIS — F0390 Unspecified dementia without behavioral disturbance: Secondary | ICD-10-CM | POA: Diagnosis not present

## 2020-05-13 DIAGNOSIS — T83091A Other mechanical complication of indwelling urethral catheter, initial encounter: Secondary | ICD-10-CM | POA: Diagnosis present

## 2020-05-13 DIAGNOSIS — I509 Heart failure, unspecified: Secondary | ICD-10-CM | POA: Insufficient documentation

## 2020-05-13 DIAGNOSIS — I110 Hypertensive heart disease with heart failure: Secondary | ICD-10-CM | POA: Diagnosis not present

## 2020-05-13 DIAGNOSIS — J449 Chronic obstructive pulmonary disease, unspecified: Secondary | ICD-10-CM | POA: Insufficient documentation

## 2020-05-13 HISTORY — DX: Unspecified hydronephrosis: N13.30

## 2020-05-13 NOTE — ED Notes (Signed)
[facility] here to transport

## 2020-05-13 NOTE — ED Triage Notes (Signed)
Pt came from [facility] nursing home with c/o urinary catheter was removed today and nursing staff was unable to replace it.

## 2020-05-13 NOTE — ED Notes (Signed)
[facility] called for transport back to [facility] at this time Kimberly [name]

## 2020-05-13 NOTE — ED Provider Notes (Signed)
[facility] EMERGENCY DEPARTMENT Provider Note   CSN: [id number] Arrival date & time: 05/13/20  1409     History Chief Complaint  Patient presents with  . foley catheter    Kimberly [name] is a 69 y.o. female with a history as outlined below, has a chronic indwelling Foley catheter, presenting from her local nursing home secondary to difficulty replacing the catheter today.  They removed the old catheter but was unable to place the new one and was therefore sent here.  Patient has no complaints of any kind at this time.  Foley catheter has been replaced by our RN here prior to my evaluation.      HPI     Past Medical History:  Diagnosis Date  . Abnormality of gait 08/02/2013  . Anemia   . Arthritis    osteoarthritis  . Blind   . CHF (congestive heart failure) (HCC)   . COPD (chronic obstructive pulmonary disease) (HCC)   . DDD (degenerative disc disease)   . Depression   . Dysarthria   . HOH (hard of hearing)   . Hydroureteronephrosis   . Hyperlipidemia   . Hyperlipidemia   . Hypertension   . Hypothyroidism   . Memory disorder 08/02/2013  . MRSA bacteremia   . Pneumonia   . Protein calorie malnutrition (HCC)   . Renal disorder   . Right hemiparesis (HCC)   . Sepsis (HCC)   . Sepsis (HCC)   . Shortness of breath   . Stroke (HCC)   . Tobacco abuse 11/02/2013  . Urinary retention     Patient Active Problem List   Diagnosis Date Noted  . Protein-calorie malnutrition, severe 04/23/2018  . Hydroureteronephrosis 04/19/2018  . ARF (acute renal failure) (HCC) 11/24/2017  . MRSA bacteremia 12/31/2016  . Sepsis secondary to UTI (HCC) 12/30/2016  . Urinary retention 07/18/2016  . Essential hypertension 07/18/2016  . Hyperlipidemia 07/18/2016  . Ischemic stroke (HCC) - L insular embolic infarct s/p tPA, source unknown 07/14/2016  . Acute right hemiparesis (HCC)   . Dysarthria   . Dementia without behavioral disturbance (HCC)   . Syncope 11/02/2013  . Syncope and  collapse 11/02/2013  . Blind 11/02/2013  . Hypothyroidism 11/02/2013  . History of CVA (cerebrovascular accident) 11/02/2013  . Bradycardia 11/02/2013  . Tobacco abuse 11/02/2013  . Memory disorder 08/02/2013  . Abnormality of gait 08/02/2013  . Chronic diastolic heart failure (HCC) 12/19/2011  . ANEMIA 05/04/2009  . LEUKOCYTOSIS UNSPECIFIED 05/04/2009  . 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 ABDOMINAL PAIN, LOWER 05/04/2009    Past Surgical History:  Procedure Laterality Date  . ARM DEBRIDEMENT     as child  . CATARACT EXTRACTION W/PHACO  04/28/2011   Procedure: CATARACT EXTRACTION PHACO AND INTRAOCULAR LENS PLACEMENT (IOC);  Surgeon: Susa Simmonds;  Location: [facility];  Service: Ophthalmology;  Laterality: Left;  CDE: 5.37  . CESAREAN SECTION    . 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 STRABISMUS SURGERY     age 47  . TEE WITHOUT CARDIOVERSION N/A 01/02/2017   Procedure: TRANSESOPHAGEAL ECHOCARDIOGRAM (TEE);  Surgeon: Antoine Poche, MD;  Location: [facility];  Service: Endoscopy;  Laterality: N/A;     OB History    Gravida  2   Para  2   Term  2   Preterm      AB      Living  2  SAB      IAB      Ectopic      Multiple      Live Births              Family History  Problem Relation Age of Onset  . Diabetes Brother   . Diabetes Brother   . Diabetes Mother   . Cancer - Lung Father   . Colon cancer Other        neg hx?     Social History   Tobacco Use  . Smoking status: Former Smoker    Packs/day: 0.50    Types: Cigarettes  . Smokeless tobacco: Never Used  Vaping Use  . Vaping Use: Never used  Substance Use Topics  . Alcohol use: No  . Drug use: No    Home Medications Prior to Admission medications   Medication Sig Start Date End Date Taking? Authorizing Provider  acetaminophen (TYLENOL) 650 MG CR tablet Take 650 mg by mouth 2 (two)  times daily.    [provider]  ARIPiprazole (ABILIFY) 2 MG tablet Take 2 mg by mouth at bedtime.    [provider]  atorvastatin (LIPITOR) 10 MG tablet Take 10 mg by mouth every evening.     [provider]  cephALEXin (KEFLEX) 500 MG capsule Take 1 capsule (500 mg total) by mouth 3 (three) times daily. 09/12/19   Raeford Razor, MD  cholecalciferol (VITAMIN D) 400 UNITS TABS Take 800 Units by mouth daily.      [provider]  donepezil (ARICEPT) 5 MG tablet TAKE 1 TABLET BY MOUTH AT BEDTIME. Patient taking differently: Take 5 mg by mouth at bedtime.  01/15/15   York Spaniel, MD  feeding supplement, ENSURE ENLIVE, (ENSURE ENLIVE) LIQD Take 237 mLs by mouth 2 (two) times daily between meals. 04/24/18   Calvert Cantor, MD  levothyroxine (SYNTHROID, LEVOTHROID) 25 MCG tablet Take 25 mcg by mouth daily.      [provider]  LORazepam (ATIVAN) 0.5 MG tablet Take 0.5 mg by mouth 2 (two) times daily.     [provider]  memantine (NAMENDA) 10 MG tablet Take 10 mg by mouth 2 (two) times daily.    [provider]  mirtazapine (REMERON) 15 MG tablet Take 7.5 mg by mouth at bedtime.  08/09/16   [provider]  Multiple Vitamin (DAILY-VITE PO) Take 1 tablet by mouth daily.    [provider]  Multiple Vitamins-Minerals (ICAPS AREDS FORMULA PO) Take 1 tablet by mouth daily.    [provider]  nitrofurantoin, macrocrystal-monohydrate, (MACROBID) 100 MG capsule Take 1 capsule (100 mg total) by mouth 2 (two) times daily. 01/14/20   Devoria Albe, MD  sertraline (ZOLOFT) 50 MG tablet Take 75 mg by mouth at bedtime.     [provider]  Vitamins A & D (VITAMIN A & D) ointment Apply 1 application topically 2 (two) times daily. Skin is washed, dried, then applied twice daily per [facility]    [provider]    Allergies    Patient has no known allergies.  Review of Systems   Review of Systems   Genitourinary:       Negative except as mentioned in HPI.   All other systems reviewed and are negative.   Physical Exam Updated Vital Signs BP 122/78 (BP Location: Left Arm)   Pulse 69   Temp 98 F (36.7 C) (Oral)   Resp 16   Ht 5\' 1"  (1.549  m)   Wt 63.5 kg   SpO2 95%   BMI 26.45 kg/m   Physical Exam Vitals and nursing note reviewed.  Constitutional:      General: She is not in acute distress.    Appearance: She is well-developed and well-nourished.  HENT:     Head: Normocephalic and atraumatic.  Eyes:     Conjunctiva/sclera: Conjunctivae normal.  Cardiovascular:     Rate and Rhythm: Normal rate and regular rhythm.     Pulses: Intact distal pulses.     Heart sounds: Normal heart sounds.  Pulmonary:     Effort: Pulmonary effort is normal.     Breath sounds: Normal breath sounds. No wheezing.  Abdominal:     General: Bowel sounds are normal. There is no distension.     Palpations: Abdomen is soft.     Tenderness: There is no abdominal tenderness.  Musculoskeletal:        General: No swelling. Normal range of motion.  Skin:    General: Skin is warm and dry.  Neurological:     Mental Status: She is alert.  Psychiatric:        Mood and Affect: Mood and affect normal.     ED Results / Procedures / Treatments   Labs (all labs ordered are listed, but only abnormal results are displayed) Labs Reviewed - No data to display  EKG None  Radiology No results found.  Procedures Procedures (including critical care time)  Medications Ordered in ED Medications - No data to display  ED Course  I have reviewed the triage vital signs and the nursing notes.  Pertinent labs & imaging results that were available during my care of the patient were reviewed by me and considered in my medical decision making (see chart for details).    MDM Rules/Calculators/A&P                          Foley catheter was replaced here.  Patient has no complaints, producing clear  urine.  No further work-up indicated.  As needed follow-up anticipated. Final Clinical Impression(s) / ED Diagnoses Final diagnoses:  Foley catheter problem, initial encounter [facility])    Rx / DC Orders ED Discharge Orders    None       Victoriano Lain 05/13/20 1555    Bethann Berkshire, MD 05/13/20 2257

## 2020-06-16 ENCOUNTER — Other Ambulatory Visit: Payer: Self-pay

## 2020-06-16 ENCOUNTER — Encounter (HOSPITAL_COMMUNITY): Payer: Self-pay

## 2020-06-16 ENCOUNTER — Emergency Department (HOSPITAL_COMMUNITY)
Admission: EM | Admit: 2020-06-16 | Discharge: 2020-06-16 | Disposition: A | Discharge status: Home / Self Care | Payer: Medicare Other | Attending: Emergency Medicine | Admitting: Emergency Medicine

## 2020-06-16 DIAGNOSIS — T83091A Other mechanical complication of indwelling urethral catheter, initial encounter: Secondary | ICD-10-CM | POA: Insufficient documentation

## 2020-06-16 DIAGNOSIS — I509 Heart failure, unspecified: Secondary | ICD-10-CM | POA: Diagnosis not present

## 2020-06-16 DIAGNOSIS — I110 Hypertensive heart disease with heart failure: Secondary | ICD-10-CM | POA: Insufficient documentation

## 2020-06-16 DIAGNOSIS — J449 Chronic obstructive pulmonary disease, unspecified: Secondary | ICD-10-CM | POA: Diagnosis not present

## 2020-06-16 DIAGNOSIS — Z79899 Other long term (current) drug therapy: Secondary | ICD-10-CM | POA: Insufficient documentation

## 2020-06-16 DIAGNOSIS — Z87891 Personal history of nicotine dependence: Secondary | ICD-10-CM | POA: Insufficient documentation

## 2020-06-16 DIAGNOSIS — E039 Hypothyroidism, unspecified: Secondary | ICD-10-CM | POA: Insufficient documentation

## 2020-06-16 DIAGNOSIS — T839XXA Unspecified complication of genitourinary prosthetic device, implant and graft, initial encounter: Secondary | ICD-10-CM

## 2020-06-16 DIAGNOSIS — Y733 Surgical instruments, materials and gastroenterology and urology devices (including sutures) associated with adverse incidents: Secondary | ICD-10-CM | POA: Diagnosis not present

## 2020-06-16 LAB — URINALYSIS, ROUTINE W REFLEX MICROSCOPIC
Bilirubin Urine: NEGATIVE
Glucose, UA: NEGATIVE mg/dL
Ketones, ur: 5 mg/dL — AB
Nitrite: NEGATIVE
Protein, ur: >=300 mg/dL — AB
RBC / HPF: >50 RBC/hpf — ABNORMAL HIGH (ref 0–5)
Specific Gravity, Urine: 1.013 (ref 1.005–1.030)
WBC, UA: >50 WBC/hpf — ABNORMAL HIGH (ref 0–5)
pH: 6 (ref 5.0–8.0)

## 2020-06-16 NOTE — ED Triage Notes (Addendum)
Pt brought to ED via [facility] from [facility] for clogged urinary catheter. Per facility, unable to get catheter to flush. Per EMS, Pt tested positive for covid last Monday but asymptomatic.

## 2020-06-16 NOTE — Discharge Instructions (Addendum)
A urine culture has been sent to evaluate for signs of an acute infection versus a chronic bacterial colonization. The results should be available in 2-4 days. If an antibiotic is needed, we will call and let you know.

## 2020-06-16 NOTE — ED Provider Notes (Signed)
[facility] EMERGENCY DEPARTMENT Provider Note  CSN: [id number] Arrival date & time: 06/16/20 1830    History Chief Complaint  Patient presents with  . Catheter Issue    HPI  Kimberly [name] is a 70 y.o. female brought to the ED from SNF for evaluation of clogged foley catheter. She has a chronic indwelling catheter, this one was placed at previous ED visit on 12/19. SNF staff unable to flush it today. Patient does not provide any history.    Past Medical History:  Diagnosis Date  . Abnormality of gait 08/02/2013  . Anemia   . Arthritis    osteoarthritis  . Blind   . CHF (congestive heart failure) (HCC)   . COPD (chronic obstructive pulmonary disease) (HCC)   . DDD (degenerative disc disease)   . Depression   . Dysarthria   . HOH (hard of hearing)   . Hydroureteronephrosis   . Hyperlipidemia   . Hyperlipidemia   . Hypertension   . Hypothyroidism   . Memory disorder 08/02/2013  . MRSA bacteremia   . Pneumonia   . Protein calorie malnutrition (HCC)   . Renal disorder   . Right hemiparesis (HCC)   . Sepsis (HCC)   . Sepsis (HCC)   . Shortness of breath   . Stroke (HCC)   . Tobacco abuse 11/02/2013  . Urinary retention     Past Surgical History:  Procedure Laterality Date  . ARM DEBRIDEMENT     as child  . CATARACT EXTRACTION W/PHACO  04/28/2011   Procedure: CATARACT EXTRACTION PHACO AND INTRAOCULAR LENS PLACEMENT (IOC);  Surgeon: Susa Simmonds;  Location: [facility];  Service: Ophthalmology;  Laterality: Left;  CDE: 5.37  . CESAREAN SECTION    . 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 STRABISMUS SURGERY     age 12  . TEE WITHOUT CARDIOVERSION N/A 01/02/2017   Procedure: TRANSESOPHAGEAL ECHOCARDIOGRAM (TEE);  Surgeon: Antoine Poche, MD;  Location[facility];  Service: Endoscopy;  Laterality: N/A;    Family History  Problem Relation Age of Onset  . Diabetes  Brother   . Diabetes Brother   . Diabetes Mother   . Cancer - Lung Father   . Colon cancer Other        neg hx?     Social History   Tobacco Use  . Smoking status: Former Smoker    Packs/day: 0.50    Types: Cigarettes  . Smokeless tobacco: Never Used  Vaping Use  . Vaping Use: Never used  Substance Use Topics  . Alcohol use: No  . Drug use: No     Home Medications Prior to Admission medications   Medication Sig Start Date End Date Taking? Authorizing Provider  acetaminophen (TYLENOL) 650 MG CR tablet Take 650 mg by mouth 2 (two) times daily.    [provider]  ARIPiprazole (ABILIFY) 2 MG tablet Take 2 mg by mouth at bedtime.    [provider]  atorvastatin (LIPITOR) 10 MG tablet Take 10 mg by mouth every evening.     [provider]  cephALEXin (KEFLEX) 500 MG capsule Take 1 capsule (500 mg total) by mouth 3 (three) times daily. 09/12/19   Raeford Razor, MD  cholecalciferol (VITAMIN D) 400 UNITS TABS Take 800 Units by mouth daily.      [provider]  donepezil (ARICEPT) 5 MG tablet TAKE 1 TABLET BY MOUTH  AT BEDTIME. Patient taking differently: Take 5 mg by mouth at bedtime.  01/15/15   York Spaniel, MD  feeding supplement, ENSURE ENLIVE, (ENSURE ENLIVE) LIQD Take 237 mLs by mouth 2 (two) times daily between meals. 04/24/18   Calvert Cantor, MD  levothyroxine (SYNTHROID, LEVOTHROID) 25 MCG tablet Take 25 mcg by mouth daily.      [provider]  LORazepam (ATIVAN) 0.5 MG tablet Take 0.5 mg by mouth 2 (two) times daily.     [provider]  memantine (NAMENDA) 10 MG tablet Take 10 mg by mouth 2 (two) times daily.    [provider]  mirtazapine (REMERON) 15 MG tablet Take 7.5 mg by mouth at bedtime.  08/09/16   [provider]  Multiple Vitamin (DAILY-VITE PO) Take 1 tablet by mouth daily.    [provider]  Multiple Vitamins-Minerals (ICAPS AREDS FORMULA PO) Take 1 tablet by mouth daily.     [provider]  nitrofurantoin, macrocrystal-monohydrate, (MACROBID) 100 MG capsule Take 1 capsule (100 mg total) by mouth 2 (two) times daily. 01/14/20   Devoria Albe, MD  sertraline (ZOLOFT) 50 MG tablet Take 75 mg by mouth at bedtime.     [provider]  Vitamins A & D (VITAMIN A & D) ointment Apply 1 application topically 2 (two) times daily. Skin is washed, dried, then applied twice daily per [facility]    [provider]     Allergies    Patient has no known allergies.   Review of Systems   Review of Systems Unable to assess due to mental status.    Physical Exam BP 122/73 (BP Location: Right Arm)   Pulse 75   Temp 98.3 F (36.8 C) (Oral)   Resp 18   Ht 5\' 1"  (1.549 m)   Wt 59 kg   SpO2 94%   BMI 24.56 kg/m   Physical Exam Vitals and nursing note reviewed.  Constitutional:      Appearance: Normal appearance.  HENT:     Head: Normocephalic and atraumatic.     Nose: Nose normal.     Mouth/Throat:     Mouth: Mucous membranes are moist.  Eyes:     Extraocular Movements: Extraocular movements intact.     Conjunctiva/sclera: Conjunctivae normal.  Cardiovascular:     Rate and Rhythm: Normal rate.  Pulmonary:     Effort: Pulmonary effort is normal.     Breath sounds: Normal breath sounds.  Abdominal:     General: Abdomen is flat.     Palpations: Abdomen is soft.     Tenderness: There is no abdominal tenderness.  Musculoskeletal:        General: No swelling. Normal range of motion.     Cervical back: Neck supple.  Skin:    General: Skin is warm and dry.  Neurological:     Mental Status: She is alert. Mental status is at baseline. She is disoriented.  Psychiatric:        Mood and Affect: Mood normal.      ED Results / Procedures / Treatments   Labs (all labs ordered are listed, but only abnormal results are displayed) Labs Reviewed  URINALYSIS, ROUTINE W REFLEX MICROSCOPIC - Abnormal; Notable for the following components:      Result  Value   Color, Urine AMBER (*)    APPearance TURBID (*)    Hgb urine dipstick SMALL (*)    Ketones, ur 5 (*)    Protein, ur >=300 (*)  Leukocytes,Ua LARGE (*)    RBC / HPF >50 (*)    WBC, UA >50 (*)    Bacteria, UA FEW (*)    All other components within normal limits  URINE CULTURE    EKG None  Radiology No results found.  Procedures Procedures  Medications Ordered in the ED Medications - No data to display   MDM Rules/Calculators/A&P MDM Catheter replaced, some cloudy urine in bag. Will send for UA/culture but no signs of systemic infection now.  ED Course  I have reviewed the triage vital signs and the nursing notes.  Pertinent labs & imaging results that were available during my care of the patient were reviewed by me and considered in my medical decision making (see chart for details).  Clinical Course as of 06/16/20 2039  Sat Jun 16, 2020  2037 UA with signs of infection. Given history of catheter this may be colonization. Since she is not having any systemic symptoms, will hold off on Abx pending urine culture.  [CS]    Clinical Course User Index [CS] Pollyann Savoy, MD    Final Clinical Impression(s) / ED Diagnoses Final diagnoses:  Foley catheter problem, initial encounter [facility])    Rx / DC Orders ED Discharge Orders    None       Pollyann Savoy, MD 06/16/20 2039

## 2020-06-19 LAB — URINE CULTURE: Culture: >=100000 — AB

## 2020-06-20 NOTE — Progress Notes (Signed)
ED Antimicrobial Stewardship Positive Culture Follow Up   Kimberly [name] is an 70 y.o. female who presented to [facility] on 01/13/2020 with a chief complaint of clogged foley catheter.  Chief Complaint  Patient presents with  . Altered Mental Status    Recent Results (from the past 720 hour(s))  Urine culture     Status: Abnormal   Collection Time: 06/16/20  7:08 PM   Specimen: Urine, Clean Catch  Result Value Ref Range Status   Specimen Description   Final    URINE, CLEAN CATCH Performed at [facility], [address] [place], [place] [address]    Special Requests   Final    NONE Performed at [facility], [address], [place], [place] [address]    Culture (A)  Final    >=100,000 COLONIES/mL ESCHERICHIA COLI 30,000 COLONIES/mL PROTEUS MIRABILIS Confirmed Extended Spectrum Beta-Lactamase Producer (ESBL).  In bloodstream infections from ESBL organisms, carbapenems are preferred over piperacillin/tazobactam. They are shown to have a lower risk of mortality. FOR ECOLI Performed at [facility], [address], [place], [place] [address]    Report Status 06/19/2020 FINAL  Final   Organism ID, Bacteria ESCHERICHIA COLI (A)  Final   Organism ID, Bacteria PROTEUS MIRABILIS (A)  Final      Susceptibility   Escherichia coli - MIC*    AMPICILLIN >=32 RESISTANT Resistant     CEFAZOLIN >=64 RESISTANT Resistant     CEFEPIME 16 RESISTANT Resistant     CEFTRIAXONE >=64 RESISTANT Resistant     CIPROFLOXACIN >=4 RESISTANT Resistant     GENTAMICIN <=1 SENSITIVE Sensitive     IMIPENEM <=0.25 SENSITIVE Sensitive     NITROFURANTOIN 64 INTERMEDIATE Intermediate     TRIMETH/SULFA >=320 RESISTANT Resistant     AMPICILLIN/SULBACTAM >=32 RESISTANT Resistant     PIP/TAZO 8 SENSITIVE Sensitive     * >=100,000 COLONIES/mL ESCHERICHIA COLI   Proteus mirabilis - MIC*    AMPICILLIN <=2 SENSITIVE Sensitive     CEFAZOLIN <=4 SENSITIVE Sensitive     CEFEPIME <=0.12 SENSITIVE Sensitive      CEFTRIAXONE <=0.25 SENSITIVE Sensitive     CIPROFLOXACIN <=0.25 SENSITIVE Sensitive     GENTAMICIN <=1 SENSITIVE Sensitive     IMIPENEM 2 SENSITIVE Sensitive     NITROFURANTOIN 128 RESISTANT Resistant     TRIMETH/SULFA <=20 SENSITIVE Sensitive     AMPICILLIN/SULBACTAM <=2 SENSITIVE Sensitive     PIP/TAZO <=4 SENSITIVE Sensitive     * 30,000 COLONIES/mL PROTEUS MIRABILIS    Patient was discharged without antibiotics as this was thought to likely be a colonizer. Patient placement to call patient and inquire if she has new symptoms of UTI. If symptomatic, patient needs to return to the ER. If asymptomatic, no antibiotic treatment required.   ED Provider: Dr. Dorris Carnes, PharmD., BCPS, BCCCP Clinical Pharmacist Please refer to [facility] for unit-specific pharmacist

## 2020-10-26 ENCOUNTER — Emergency Department (HOSPITAL_COMMUNITY)
Admission: EM | Admit: 2020-10-26 | Discharge: 2020-10-27 | Disposition: A | Discharge status: Home / Self Care | Payer: Medicare Other | Attending: Emergency Medicine | Admitting: Emergency Medicine

## 2020-10-26 ENCOUNTER — Encounter (HOSPITAL_COMMUNITY): Payer: Self-pay

## 2020-10-26 ENCOUNTER — Emergency Department (HOSPITAL_COMMUNITY): Payer: Medicare Other

## 2020-10-26 ENCOUNTER — Other Ambulatory Visit: Payer: Self-pay

## 2020-10-26 DIAGNOSIS — I110 Hypertensive heart disease with heart failure: Secondary | ICD-10-CM | POA: Insufficient documentation

## 2020-10-26 DIAGNOSIS — I5032 Chronic diastolic (congestive) heart failure: Secondary | ICD-10-CM | POA: Insufficient documentation

## 2020-10-26 DIAGNOSIS — E039 Hypothyroidism, unspecified: Secondary | ICD-10-CM | POA: Insufficient documentation

## 2020-10-26 DIAGNOSIS — T83091A Other mechanical complication of indwelling urethral catheter, initial encounter: Secondary | ICD-10-CM | POA: Diagnosis not present

## 2020-10-26 DIAGNOSIS — Z87891 Personal history of nicotine dependence: Secondary | ICD-10-CM | POA: Insufficient documentation

## 2020-10-26 DIAGNOSIS — Y846 Urinary catheterization as the cause of abnormal reaction of the patient, or of later complication, without mention of misadventure at the time of the procedure: Secondary | ICD-10-CM | POA: Insufficient documentation

## 2020-10-26 DIAGNOSIS — J449 Chronic obstructive pulmonary disease, unspecified: Secondary | ICD-10-CM | POA: Insufficient documentation

## 2020-10-26 DIAGNOSIS — T839XXA Unspecified complication of genitourinary prosthetic device, implant and graft, initial encounter: Secondary | ICD-10-CM

## 2020-10-26 DIAGNOSIS — Z20822 Contact with and (suspected) exposure to covid-19: Secondary | ICD-10-CM | POA: Insufficient documentation

## 2020-10-26 DIAGNOSIS — F0390 Unspecified dementia without behavioral disturbance: Secondary | ICD-10-CM | POA: Insufficient documentation

## 2020-10-26 DIAGNOSIS — Z79899 Other long term (current) drug therapy: Secondary | ICD-10-CM | POA: Insufficient documentation

## 2020-10-26 LAB — RESP PANEL BY RT-PCR (FLU A&B, COVID) ARPGX2
Influenza A by PCR: NEGATIVE
Influenza B by PCR: NEGATIVE
SARS Coronavirus 2 by RT PCR: NEGATIVE

## 2020-10-26 NOTE — ED Provider Notes (Signed)
[facility] EMERGENCY DEPARTMENT Provider Note   CSN: [id number] Arrival date & time: 10/26/20  2043     History Chief Complaint  Patient presents with  . urinary catheter removed by patient    Rae MarSara L Pope is a 70 y.o. female.  HPI This patient who is debilitated, lives in a nursing care facility and pulled her Foley out.  She has a chronic Foley.  Level 5 caveat-dementia    Past Medical History:  Diagnosis Date  . Abnormality of gait 08/02/2013  . Anemia   . Arthritis    osteoarthritis  . Blind   . CHF (congestive heart failure) (HCC)   . COPD (chronic obstructive pulmonary disease) (HCC)   . DDD (degenerative disc disease)   . Depression   . Dysarthria   . HOH (hard of hearing)   . Hydroureteronephrosis   . Hyperlipidemia   . Hyperlipidemia   . Hypertension   . Hypothyroidism   . Memory disorder 08/02/2013  . MRSA bacteremia   . Pneumonia   . Protein calorie malnutrition (HCC)   . Renal disorder   . Right hemiparesis (HCC)   . Sepsis (HCC)   . Sepsis (HCC)   . Shortness of breath   . Stroke (HCC)   . Tobacco abuse 11/02/2013  . Urinary retention     Patient Active Problem List   Diagnosis Date Noted  . Vitamin B12 deficiency anemia due to intrinsic factor deficiency 04/12/2020  . Encounter for fitting and adjustment of urinary device 05/27/2019  . Heart failure, unspecified (HCC) 02/17/2019  . Hypertensive heart disease with heart failure (HCC) 02/17/2019  . 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 Personal history of urinary (tract) infections 10/11/2018  . Anxiety disorder, unspecified 05/26/2018  . Chronic obstructive pulmonary disease, unspecified (HCC) 05/26/2018  . Nicotine dependence, cigarettes, uncomplicated 05/26/2018  . Thoracic, thoracolumbar and lumbosacral intervertebral disc disorder 05/26/2018  . Protein-calorie malnutrition, severe 04/23/2018  . Hydroureteronephrosis 04/19/2018  . ARF (acute  renal failure) (HCC) 11/24/2017  . MRSA bacteremia 12/31/2016  . Sepsis secondary to UTI (HCC) 12/30/2016  . Urinary retention 07/18/2016  . Essential hypertension 07/18/2016  . Hyperlipidemia 07/18/2016  . Ischemic stroke (HCC) - L insular embolic infarct s/p tPA, source unknown 07/14/2016  . Acute right hemiparesis (HCC)   . Dysarthria   . Dementia without behavioral disturbance (HCC)   . Syncope 11/02/2013  . Syncope and collapse 11/02/2013  . Blind 11/02/2013  . Hypothyroidism 11/02/2013  . History of CVA (cerebrovascular accident) 11/02/2013  . Bradycardia 11/02/2013  . Tobacco abuse 11/02/2013  . Memory disorder 08/02/2013  . Abnormality of gait 08/02/2013  . Chronic diastolic heart failure (HCC) 12/19/2011  . ANEMIA 05/04/2009  . LEUKOCYTOSIS UNSPECIFIED 05/04/2009  . 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 ABDOMINAL PAIN, LOWER 05/04/2009    Past Surgical History:  Procedure Laterality Date  . ARM DEBRIDEMENT     as child  . CATARACT EXTRACTION W/PHACO  04/28/2011   Procedure: CATARACT EXTRACTION PHACO AND INTRAOCULAR LENS PLACEMENT (IOC);  Surgeon: Susa Simmondsarroll F Haines;  Location: [facility] ORS;  Service: Ophthalmology;  Laterality: Left;  CDE: 5.37  . CESAREAN SECTION    . 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 STRABISMUS SURGERY     age 70  . TEE WITHOUT CARDIOVERSION N/A 01/02/2017   Procedure: TRANSESOPHAGEAL ECHOCARDIOGRAM (TEE);  Surgeon: Antoine PocheBranch, Jonathan F, MD;  Location: [facility]  ENDO SUITE;  Service: Endoscopy;  Laterality: N/A;     OB History    Gravida  2   Para  2   Term  2   Preterm      AB      Living  2     SAB      IAB      Ectopic      Multiple      Live Births              Family History  Problem Relation Age of Onset  . Diabetes Brother   . Diabetes Brother   . Diabetes Mother   . Cancer - Lung Father   . Colon cancer Other        neg hx?      Social History   Tobacco Use  . Smoking status: Former Smoker    Packs/day: 0.50    Types: Cigarettes  . Smokeless tobacco: Never Used  Vaping Use  . Vaping Use: Never used  Substance Use Topics  . Alcohol use: No  . Drug use: No    Home Medications Prior to Admission medications   Medication Sig Start Date End Date Taking? Authorizing Provider  acetaminophen (TYLENOL) 650 MG CR tablet Take 650 mg by mouth 2 (two) times daily.    [provider]  ARIPiprazole (ABILIFY) 2 MG tablet Take 2 mg by mouth at bedtime.    [provider]  atorvastatin (LIPITOR) 10 MG tablet Take 10 mg by mouth every evening.     [provider]  cephALEXin (KEFLEX) 500 MG capsule Take 1 capsule (500 mg total) by mouth 3 (three) times daily. 09/12/19   Raeford Razor, MD  cholecalciferol (VITAMIN D) 400 UNITS TABS Take 800 Units by mouth daily.      [provider]  donepezil (ARICEPT) 5 MG tablet TAKE 1 TABLET BY MOUTH AT BEDTIME. Patient taking differently: Take 5 mg by mouth at bedtime.  01/15/15   York Spaniel, MD  feeding supplement, ENSURE ENLIVE, (ENSURE ENLIVE) LIQD Take 237 mLs by mouth 2 (two) times daily between meals. 04/24/18   Calvert Cantor, MD  levothyroxine (SYNTHROID, LEVOTHROID) 25 MCG tablet Take 25 mcg by mouth daily.      [provider]  LORazepam (ATIVAN) 0.5 MG tablet Take 0.5 mg by mouth 2 (two) times daily.     [provider]  memantine (NAMENDA) 10 MG tablet Take 10 mg by mouth 2 (two) times daily.    [provider]  mirtazapine (REMERON) 15 MG tablet Take 7.5 mg by mouth at bedtime.  08/09/16   [provider]  mirtazapine (REMERON) 7.5 MG tablet Take 7.5 mg by mouth at bedtime. 10/24/20   [provider]  Multiple Vitamin (DAILY-VITE PO) Take 1 tablet by mouth daily.    [provider]  Multiple Vitamins-Minerals (ICAPS AREDS FORMULA PO) Take 1 tablet by mouth daily.    [provider]  nitrofurantoin, macrocrystal-monohydrate, (MACROBID) 100 MG capsule Take 1 capsule (100 mg total) by mouth 2 (two) times daily. 01/14/20   Devoria Albe, MD  sertraline (ZOLOFT) 50 MG tablet Take 75 mg by mouth at bedtime.     [provider]  Vitamins A & D (VITAMIN A & D) ointment Apply 1 application topically 2 (two) times daily. Skin is washed, dried, then applied twice daily per [facility]    [provider]    Allergies    Patient has  no known allergies.  Review of Systems   Review of Systems  Unable to perform ROS: Dementia    Physical Exam Updated Vital Signs BP 114/70   Pulse 62   Temp (!) 97.5 F (36.4 C) (Oral)   Resp 14   SpO2 96%   Physical Exam Vitals and nursing note reviewed.  Constitutional:      General: She is not in acute distress.    Appearance: She is well-developed. She is not ill-appearing or diaphoretic.  HENT:     Head: Normocephalic and atraumatic.     Right Ear: External ear normal.     Left Ear: External ear normal.  Eyes:     Conjunctiva/sclera: Conjunctivae normal.     Pupils: Pupils are equal, round, and reactive to light.  Neck:     Trachea: Phonation normal.  Cardiovascular:     Rate and Rhythm: Normal rate.  Pulmonary:     Effort: Pulmonary effort is normal.  Abdominal:     General: There is no distension.     Tenderness: There is no abdominal tenderness.  Musculoskeletal:        General: No swelling or tenderness. Normal range of motion.     Cervical back: Normal range of motion and neck supple.  Skin:    General: Skin is warm and dry.  Neurological:     Mental Status: She is alert.     Cranial Nerves: No cranial nerve deficit.     Motor: No abnormal muscle tone.     Coordination: Coordination normal.  Psychiatric:        Mood and Affect: Mood normal.        Behavior: Behavior normal.     ED Results / Procedures / Treatments   Labs (all labs ordered are listed, but only abnormal results are  displayed) Labs Reviewed  RESP PANEL BY RT-PCR (FLU A&B, COVID) [id number]    EKG None  Radiology DG Chest 1 View  Result Date: 10/26/2020 CLINICAL DATA:  Hypoxia EXAM: CHEST  1 VIEW COMPARISON:  None. FINDINGS: The heart size and mediastinal contours are within normal limits. Both lungs are clear. The visualized skeletal structures are unremarkable. IMPRESSION: No active disease. Electronically Signed   By: Deatra Robinson M.D.   On: 10/26/2020 22:21    Procedures Procedures   Medications Ordered in ED Medications - No data to display  ED Course  I have reviewed the triage vital signs and the nursing notes.  Pertinent labs & imaging results that were available during my care of the patient were reviewed by me and considered in my medical decision making (see chart for details).    MDM Rules/Calculators/A&P                           Patient Vitals for the past 24 hrs:  BP Temp Temp src Pulse Resp SpO2  10/26/20 2230 114/70 (!) 97.5 F (36.4 C) Oral 62 14 96 %  10/26/20 2200 -- -- -- -- -- 100 %  10/26/20 2115 92/63 (!) 96.5 F (35.8 C) Oral (!) 56 16 (!) 88 %    At time of discharge- reevaluation with update and discussion. After initial assessment and treatment, an updated evaluation reveals no further complaints, instructions sent with patient. Mancel Bale   Medical Decision Making:  This patient is presenting for evaluation of dislodged Foley, which does not require a range of treatment options, and is not a complaint that  involves a high risk of morbidity and mortality.  Critical Interventions-clinical evaluation, Foley replaced  After These Interventions, the Patient was reevaluated and was found stable for discharge to continue chronic care  CRITICAL CARE-no Performed by: Mancel Bale  Nursing Notes Reviewed/ Care Coordinated Applicable Imaging Reviewed Interpretation of Laboratory Data incorporated into ED treatment  The patient appears reasonably screened  and/or stabilized for discharge and I doubt any other medical condition or other [facility] requiring further screening, evaluation, or treatment in the ED at this time prior to discharge.  Plan: Home Medications-continue usual medications; Home Treatments-usual Foley catheter care; return here if the recommended treatment, does not improve the symptoms; Recommended follow up-PCP, as needed     Final Clinical Impression(s) / ED Diagnoses Final diagnoses:  Problem with Foley catheter, initial encounter [facility])    Rx / DC Orders ED Discharge Orders    None       Mancel Bale, MD 10/27/20 1043

## 2020-10-26 NOTE — ED Notes (Signed)
O2 sats 88% on room air, oxygen applied at 1nc with sats increased to 92@.

## 2020-10-26 NOTE — Discharge Instructions (Addendum)
Continue your regular care at your nursing facility

## 2020-10-26 NOTE — ED Triage Notes (Signed)
Pt lives at [facility] and pulled her catheter out. There was not any staff that could reinsert it.

## 2020-10-27 NOTE — ED Notes (Signed)
Checked pt; brief is dry and urinary catheter is functioning appropriately

## 2020-10-27 NOTE — ED Notes (Signed)
Pt moved to room 9 for foley catheter insertion, upon removal of pts pants it is noted that foley catheter is inserted however, urinary drainage bag has been removed. Dr. Effie Shy notified. Report called to [facility] staff.

## 2021-08-09 ENCOUNTER — Inpatient Hospital Stay (HOSPITAL_COMMUNITY): Payer: Medicare Other

## 2021-08-09 ENCOUNTER — Emergency Department (HOSPITAL_COMMUNITY): Payer: Medicare Other

## 2021-08-09 ENCOUNTER — Inpatient Hospital Stay (HOSPITAL_COMMUNITY)
Admission: EM | Admit: 2021-08-09 | Discharge: 2021-08-13 | DRG: 177 | Disposition: A | Discharge status: Skilled Nursing Facility | Payer: Medicare Other | Source: Skilled Nursing Facility | Attending: Internal Medicine | Admitting: Internal Medicine

## 2021-08-09 ENCOUNTER — Other Ambulatory Visit: Payer: Self-pay

## 2021-08-09 ENCOUNTER — Encounter (HOSPITAL_COMMUNITY): Payer: Self-pay | Admitting: *Deleted

## 2021-08-09 DIAGNOSIS — J9601 Acute respiratory failure with hypoxia: Secondary | ICD-10-CM | POA: Diagnosis present

## 2021-08-09 DIAGNOSIS — M1990 Unspecified osteoarthritis, unspecified site: Secondary | ICD-10-CM | POA: Diagnosis present

## 2021-08-09 DIAGNOSIS — Z79899 Other long term (current) drug therapy: Secondary | ICD-10-CM

## 2021-08-09 DIAGNOSIS — Z801 Family history of malignant neoplasm of trachea, bronchus and lung: Secondary | ICD-10-CM | POA: Diagnosis not present

## 2021-08-09 DIAGNOSIS — T83518A Infection and inflammatory reaction due to other urinary catheter, initial encounter: Secondary | ICD-10-CM | POA: Diagnosis present

## 2021-08-09 DIAGNOSIS — F0390 Unspecified dementia without behavioral disturbance: Secondary | ICD-10-CM | POA: Diagnosis not present

## 2021-08-09 DIAGNOSIS — Z87891 Personal history of nicotine dependence: Secondary | ICD-10-CM

## 2021-08-09 DIAGNOSIS — Z681 Body mass index (BMI) 19 or less, adult: Secondary | ICD-10-CM

## 2021-08-09 DIAGNOSIS — I69322 Dysarthria following cerebral infarction: Secondary | ICD-10-CM | POA: Diagnosis not present

## 2021-08-09 DIAGNOSIS — H547 Unspecified visual loss: Secondary | ICD-10-CM | POA: Diagnosis present

## 2021-08-09 DIAGNOSIS — D649 Anemia, unspecified: Secondary | ICD-10-CM | POA: Diagnosis present

## 2021-08-09 DIAGNOSIS — Z833 Family history of diabetes mellitus: Secondary | ICD-10-CM

## 2021-08-09 DIAGNOSIS — Z978 Presence of other specified devices: Secondary | ICD-10-CM

## 2021-08-09 DIAGNOSIS — I10 Essential (primary) hypertension: Secondary | ICD-10-CM | POA: Diagnosis not present

## 2021-08-09 DIAGNOSIS — I110 Hypertensive heart disease with heart failure: Secondary | ICD-10-CM | POA: Diagnosis present

## 2021-08-09 DIAGNOSIS — F03C4 Unspecified dementia, severe, with anxiety: Secondary | ICD-10-CM | POA: Diagnosis present

## 2021-08-09 DIAGNOSIS — E785 Hyperlipidemia, unspecified: Secondary | ICD-10-CM | POA: Diagnosis present

## 2021-08-09 DIAGNOSIS — N390 Urinary tract infection, site not specified: Secondary | ICD-10-CM | POA: Diagnosis present

## 2021-08-09 DIAGNOSIS — R339 Retention of urine, unspecified: Secondary | ICD-10-CM | POA: Diagnosis present

## 2021-08-09 DIAGNOSIS — Z8673 Personal history of transient ischemic attack (TIA), and cerebral infarction without residual deficits: Secondary | ICD-10-CM | POA: Diagnosis not present

## 2021-08-09 DIAGNOSIS — E039 Hypothyroidism, unspecified: Secondary | ICD-10-CM | POA: Diagnosis present

## 2021-08-09 DIAGNOSIS — I5032 Chronic diastolic (congestive) heart failure: Secondary | ICD-10-CM | POA: Diagnosis present

## 2021-08-09 DIAGNOSIS — Y846 Urinary catheterization as the cause of abnormal reaction of the patient, or of later complication, without mention of misadventure at the time of the procedure: Secondary | ICD-10-CM | POA: Diagnosis present

## 2021-08-09 DIAGNOSIS — Z66 Do not resuscitate: Secondary | ICD-10-CM | POA: Diagnosis present

## 2021-08-09 DIAGNOSIS — R0902 Hypoxemia: Secondary | ICD-10-CM | POA: Diagnosis present

## 2021-08-09 DIAGNOSIS — U071 COVID-19: Secondary | ICD-10-CM | POA: Diagnosis present

## 2021-08-09 DIAGNOSIS — F419 Anxiety disorder, unspecified: Secondary | ICD-10-CM | POA: Diagnosis not present

## 2021-08-09 DIAGNOSIS — Z79890 Hormone replacement therapy (postmenopausal): Secondary | ICD-10-CM | POA: Diagnosis not present

## 2021-08-09 DIAGNOSIS — E43 Unspecified severe protein-calorie malnutrition: Secondary | ICD-10-CM | POA: Diagnosis present

## 2021-08-09 DIAGNOSIS — T83511A Infection and inflammatory reaction due to indwelling urethral catheter, initial encounter: Secondary | ICD-10-CM

## 2021-08-09 DIAGNOSIS — J449 Chronic obstructive pulmonary disease, unspecified: Secondary | ICD-10-CM | POA: Diagnosis present

## 2021-08-09 DIAGNOSIS — Z720 Tobacco use: Secondary | ICD-10-CM | POA: Diagnosis present

## 2021-08-09 DIAGNOSIS — I69351 Hemiplegia and hemiparesis following cerebral infarction affecting right dominant side: Secondary | ICD-10-CM | POA: Diagnosis not present

## 2021-08-09 LAB — URINALYSIS, ROUTINE W REFLEX MICROSCOPIC
Bacteria, UA: NONE SEEN
Bilirubin Urine: NEGATIVE
Bilirubin Urine: NEGATIVE
Bilirubin Urine: NEGATIVE
Glucose, UA: NEGATIVE mg/dL
Glucose, UA: NEGATIVE mg/dL
Glucose, UA: NEGATIVE mg/dL
Hgb urine dipstick: NEGATIVE
Ketones, ur: 5 mg/dL — AB
Ketones, ur: 5 mg/dL — AB
Ketones, ur: 5 mg/dL — AB
Nitrite: NEGATIVE
Nitrite: NEGATIVE
Nitrite: NEGATIVE
Protein, ur: 100 mg/dL — AB
Protein, ur: 100 mg/dL — AB
Protein, ur: 100 mg/dL — AB
Specific Gravity, Urine: 1.016 (ref 1.005–1.030)
Specific Gravity, Urine: 1.012 (ref 1.005–1.030)
Specific Gravity, Urine: 1.024 (ref 1.005–1.030)
WBC, UA: >50 WBC/hpf — ABNORMAL HIGH (ref 0–5)
WBC, UA: >50 WBC/hpf — ABNORMAL HIGH (ref 0–5)
pH: 7 (ref 5.0–8.0)
pH: 6 (ref 5.0–8.0)
pH: 6 (ref 5.0–8.0)

## 2021-08-09 LAB — COMPREHENSIVE METABOLIC PANEL
ALT: 13 U/L (ref 0–44)
AST: 17 U/L (ref 15–41)
Albumin: 3 g/dL — ABNORMAL LOW (ref 3.5–5.0)
Alkaline Phosphatase: 69 U/L (ref 38–126)
Anion gap: 9 (ref 5–15)
BUN: 15 mg/dL (ref 8–23)
CO2: 28 mmol/L (ref 22–32)
Calcium: 8.8 mg/dL — ABNORMAL LOW (ref 8.9–10.3)
Chloride: 105 mmol/L (ref 98–111)
Creatinine, Ser: 0.65 mg/dL (ref 0.44–1.00)
GFR, Estimated: >60 mL/min (ref >60)
Glucose, Bld: 104 mg/dL — ABNORMAL HIGH (ref 70–99)
Potassium: 3.9 mmol/L (ref 3.5–5.1)
Sodium: 142 mmol/L (ref 135–145)
Total Bilirubin: 0.2 mg/dL — ABNORMAL LOW (ref 0.3–1.2)
Total Protein: 6.6 g/dL (ref 6.5–8.1)

## 2021-08-09 LAB — CBC WITH DIFFERENTIAL/PLATELET
Abs Immature Granulocytes: 0.01 10*3/uL (ref 0.00–0.07)
Basophils Absolute: 0 10*3/uL (ref 0.0–0.1)
Basophils Relative: 1 %
Eosinophils Absolute: 0.1 10*3/uL (ref 0.0–0.5)
Eosinophils Relative: 2 %
HCT: 39.2 % (ref 36.0–46.0)
Hemoglobin: 12.1 g/dL (ref 12.0–15.0)
Immature Granulocytes: 0 %
Lymphocytes Relative: 21 %
Lymphs Abs: 1.8 10*3/uL (ref 0.7–4.0)
MCH: 28.7 pg (ref 26.0–34.0)
MCHC: 30.9 g/dL (ref 30.0–36.0)
MCV: 93.1 fL (ref 80.0–100.0)
Monocytes Absolute: 1.3 10*3/uL — ABNORMAL HIGH (ref 0.1–1.0)
Monocytes Relative: 15 %
Neutro Abs: 5.4 10*3/uL (ref 1.7–7.7)
Neutrophils Relative %: 61 %
Platelets: 311 10*3/uL (ref 150–400)
RBC: 4.21 MIL/uL (ref 3.87–5.11)
RDW: 13.8 % (ref 11.5–15.5)
WBC: 8.7 10*3/uL (ref 4.0–10.5)
nRBC: 0 % (ref 0.0–0.2)

## 2021-08-09 LAB — LACTIC ACID, PLASMA: Lactic Acid, Venous: 1 mmol/L (ref 0.5–1.9)

## 2021-08-09 LAB — PROTIME-INR
INR: 1 (ref 0.8–1.2)
Prothrombin Time: 12.9 seconds (ref 11.4–15.2)

## 2021-08-09 LAB — APTT: aPTT: 29 seconds (ref 24–36)

## 2021-08-09 LAB — LACTATE DEHYDROGENASE: LDH: 125 U/L (ref 98–192)

## 2021-08-09 LAB — RESP PANEL BY RT-PCR (FLU A&B, COVID) ARPGX2
Influenza A by PCR: NEGATIVE
Influenza B by PCR: NEGATIVE
SARS Coronavirus 2 by RT PCR: POSITIVE — AB

## 2021-08-09 LAB — FERRITIN: Ferritin: 104 ng/mL (ref 11–307)

## 2021-08-09 LAB — TRIGLYCERIDES: Triglycerides: 73 mg/dL (ref <150)

## 2021-08-09 LAB — CBG MONITORING, ED
Glucose-Capillary: 94 mg/dL (ref 70–99)
Glucose-Capillary: 262 mg/dL — ABNORMAL HIGH (ref 70–99)

## 2021-08-09 LAB — FIBRINOGEN: Fibrinogen: 366 mg/dL (ref 210–475)

## 2021-08-09 LAB — D-DIMER, QUANTITATIVE: D-Dimer, Quant: 0.93 ug/mL-FEU — ABNORMAL HIGH (ref 0.00–0.50)

## 2021-08-09 LAB — PROCALCITONIN: Procalcitonin: <0.1 ng/mL

## 2021-08-09 LAB — C-REACTIVE PROTEIN: CRP: 9.8 mg/dL — ABNORMAL HIGH (ref <1.0)

## 2021-08-09 MED ORDER — SERTRALINE HCL 50 MG PO TABS
75.0000 mg | ORAL_TABLET | Freq: Every day | ORAL | Status: DC
  Administered 2021-08-09 – 2021-08-12 (×4): 75 mg via ORAL
  Filled 2021-08-09 (×4): qty 2

## 2021-08-09 MED ORDER — MOMETASONE FURO-FORMOTEROL FUM 100-5 MCG/ACT IN AERO
2.0000 | INHALATION_SPRAY | Freq: Two times a day (BID) | RESPIRATORY_TRACT | Status: DC
Start: 2021-08-09 — End: 2021-08-10
  Administered 2021-08-09: 2 via RESPIRATORY_TRACT
  Filled 2021-08-09: qty 8.8

## 2021-08-09 MED ORDER — SODIUM CHLORIDE 0.9 % IV SOLN: 100.0000 mg | Freq: Every day | INTRAVENOUS | Status: DC

## 2021-08-09 MED ORDER — ARIPIPRAZOLE 2 MG PO TABS
2.0000 mg | ORAL_TABLET | Freq: Every day | ORAL | Status: DC
  Administered 2021-08-09 – 2021-08-12 (×2): 2 mg via ORAL
  Filled 2021-08-09 (×6): qty 1

## 2021-08-09 MED ORDER — LACTATED RINGERS IV BOLUS (SEPSIS)
1000.0000 mL | Freq: Once | INTRAVENOUS | Status: AC
  Administered 2021-08-09: 1000 mL via INTRAVENOUS

## 2021-08-09 MED ORDER — INSULIN ASPART 100 UNIT/ML IJ SOLN
0.0000 [IU] | Freq: Three times a day (TID) | INTRAMUSCULAR | Status: DC
  Administered 2021-08-10 – 2021-08-11 (×4): 3 [IU] via SUBCUTANEOUS
  Administered 2021-08-11: 2 [IU] via SUBCUTANEOUS
  Administered 2021-08-11 – 2021-08-12 (×2): 3 [IU] via SUBCUTANEOUS

## 2021-08-09 MED ORDER — OCUVITE-LUTEIN PO CAPS
1.0000 | ORAL_CAPSULE | Freq: Every day | ORAL | Status: DC
  Administered 2021-08-10 – 2021-08-13 (×4): 1 via ORAL
  Filled 2021-08-09 (×4): qty 1

## 2021-08-09 MED ORDER — ATORVASTATIN CALCIUM 10 MG PO TABS
10.0000 mg | ORAL_TABLET | Freq: Every evening | ORAL | Status: DC
  Administered 2021-08-09 – 2021-08-12 (×4): 10 mg via ORAL
  Filled 2021-08-09 (×4): qty 1

## 2021-08-09 MED ORDER — MEMANTINE HCL 10 MG PO TABS
10.0000 mg | ORAL_TABLET | Freq: Two times a day (BID) | ORAL | Status: DC
  Administered 2021-08-09 – 2021-08-13 (×8): 10 mg via ORAL
  Filled 2021-08-09 (×8): qty 1

## 2021-08-09 MED ORDER — METHYLPREDNISOLONE SODIUM SUCC 125 MG IJ SOLR
125.0000 mg | Freq: Once | INTRAMUSCULAR | Status: AC
  Administered 2021-08-09: 125 mg via INTRAVENOUS
  Filled 2021-08-09: qty 2

## 2021-08-09 MED ORDER — ASCORBIC ACID 500 MG PO TABS
500.0000 mg | ORAL_TABLET | Freq: Every day | ORAL | Status: DC
  Administered 2021-08-09 – 2021-08-13 (×5): 500 mg via ORAL
  Filled 2021-08-09 (×6): qty 1

## 2021-08-09 MED ORDER — CHOLECALCIFEROL 10 MCG (400 UNIT) PO TABS
800.0000 [IU] | ORAL_TABLET | Freq: Every day | ORAL | Status: DC
  Administered 2021-08-09 – 2021-08-13 (×5): 800 [IU] via ORAL
  Filled 2021-08-09 (×5): qty 2

## 2021-08-09 MED ORDER — ENOXAPARIN SODIUM 40 MG/0.4ML IJ SOSY
40.0000 mg | PREFILLED_SYRINGE | INTRAMUSCULAR | Status: DC
  Administered 2021-08-09 – 2021-08-11 (×3): 40 mg via SUBCUTANEOUS
  Filled 2021-08-09 (×4): qty 0.4

## 2021-08-09 MED ORDER — METHYLPREDNISOLONE SODIUM SUCC 125 MG IJ SOLR
60.0000 mg | Freq: Two times a day (BID) | INTRAMUSCULAR | Status: AC
  Administered 2021-08-10 – 2021-08-12 (×6): 60 mg via INTRAVENOUS
  Filled 2021-08-09 (×6): qty 2

## 2021-08-09 MED ORDER — POLYETHYLENE GLYCOL 3350 17 G PO PACK: 17.0000 g | PACK | Freq: Every day | ORAL | Status: DC | PRN

## 2021-08-09 MED ORDER — SENNA 8.6 MG PO TABS
1.0000 | ORAL_TABLET | Freq: Two times a day (BID) | ORAL | Status: DC
  Administered 2021-08-09 – 2021-08-13 (×9): 8.6 mg via ORAL
  Filled 2021-08-09 (×10): qty 1

## 2021-08-09 MED ORDER — PREDNISONE 20 MG PO TABS
50.0000 mg | ORAL_TABLET | Freq: Every day | ORAL | Status: DC
  Administered 2021-08-13: 50 mg via ORAL
  Filled 2021-08-09: qty 3

## 2021-08-09 MED ORDER — ONDANSETRON HCL 4 MG PO TABS: 4.0000 mg | ORAL_TABLET | Freq: Four times a day (QID) | ORAL | Status: DC | PRN

## 2021-08-09 MED ORDER — LEVOTHYROXINE SODIUM 25 MCG PO TABS
25.0000 ug | ORAL_TABLET | Freq: Every day | ORAL | Status: DC
  Administered 2021-08-10 – 2021-08-12 (×3): 25 ug via ORAL
  Filled 2021-08-09 (×4): qty 1

## 2021-08-09 MED ORDER — LORAZEPAM 0.5 MG PO TABS
0.5000 mg | ORAL_TABLET | Freq: Two times a day (BID) | ORAL | Status: DC
  Administered 2021-08-09 – 2021-08-13 (×8): 0.5 mg via ORAL
  Filled 2021-08-09 (×8): qty 1

## 2021-08-09 MED ORDER — IPRATROPIUM-ALBUTEROL 20-100 MCG/ACT IN AERS: 1.0000 | INHALATION_SPRAY | Freq: Four times a day (QID) | RESPIRATORY_TRACT | Status: DC

## 2021-08-09 MED ORDER — ADULT MULTIVITAMIN W/MINERALS CH
1.0000 | ORAL_TABLET | Freq: Every day | ORAL | Status: DC
  Administered 2021-08-09 – 2021-08-13 (×5): 1 via ORAL
  Filled 2021-08-09 (×5): qty 1

## 2021-08-09 MED ORDER — DONEPEZIL HCL 5 MG PO TABS
5.0000 mg | ORAL_TABLET | Freq: Every day | ORAL | Status: DC
  Administered 2021-08-09 – 2021-08-12 (×4): 5 mg via ORAL
  Filled 2021-08-09 (×4): qty 1

## 2021-08-09 MED ORDER — ENSURE ENLIVE PO LIQD
237.0000 mL | Freq: Two times a day (BID) | ORAL | Status: DC
  Administered 2021-08-09 – 2021-08-13 (×8): 237 mL via ORAL
  Filled 2021-08-09 (×5): qty 237

## 2021-08-09 MED ORDER — IOHEXOL 350 MG/ML SOLN
75.0000 mL | Freq: Once | INTRAVENOUS | Status: AC | PRN
  Administered 2021-08-09: 75 mL via INTRAVENOUS

## 2021-08-09 MED ORDER — GUAIFENESIN 100 MG/5ML PO LIQD
15.0000 mL | Freq: Two times a day (BID) | ORAL | Status: DC
  Administered 2021-08-09 – 2021-08-13 (×9): 15 mL via ORAL
  Filled 2021-08-09 (×9): qty 15

## 2021-08-09 MED ORDER — SODIUM CHLORIDE 0.9 % IV SOLN
INTRAVENOUS | Status: AC
Start: 2021-08-09 — End: 2021-08-12

## 2021-08-09 MED ORDER — SODIUM CHLORIDE 0.9% FLUSH
3.0000 mL | Freq: Two times a day (BID) | INTRAVENOUS | Status: DC
  Administered 2021-08-10 – 2021-08-13 (×6): 3 mL via INTRAVENOUS

## 2021-08-09 MED ORDER — PANTOPRAZOLE SODIUM 40 MG PO TBEC
40.0000 mg | DELAYED_RELEASE_TABLET | Freq: Every day | ORAL | Status: DC
  Administered 2021-08-09 – 2021-08-13 (×5): 40 mg via ORAL
  Filled 2021-08-09 (×5): qty 1

## 2021-08-09 MED ORDER — MIRTAZAPINE 15 MG PO TABS
7.5000 mg | ORAL_TABLET | Freq: Every day | ORAL | Status: DC
  Administered 2021-08-09 – 2021-08-12 (×4): 7.5 mg via ORAL
  Filled 2021-08-09 (×4): qty 1

## 2021-08-09 MED ORDER — SODIUM CHLORIDE 0.9 % IV SOLN: 200.0000 mg | Freq: Once | INTRAVENOUS | Status: DC

## 2021-08-09 MED ORDER — SODIUM CHLORIDE 0.9 % IV SOLN
2.0000 g | INTRAVENOUS | Status: AC
  Administered 2021-08-09 – 2021-08-12 (×4): 2 g via INTRAVENOUS
  Filled 2021-08-09 (×4): qty 20

## 2021-08-09 MED ORDER — GUAIFENESIN-DM 100-10 MG/5ML PO SYRP: 10.0000 mL | ORAL_SOLUTION | ORAL | Status: DC | PRN

## 2021-08-09 MED ORDER — SORBITOL 70 % SOLN
30.0000 mL | Freq: Every day | Status: DC | PRN
  Filled 2021-08-09: qty 30

## 2021-08-09 MED ORDER — SODIUM CHLORIDE 0.9 % IV SOLN
100.0000 mg | INTRAVENOUS | Status: AC
  Administered 2021-08-09 (×2): 100 mg via INTRAVENOUS
  Filled 2021-08-09 (×2): qty 20

## 2021-08-09 MED ORDER — IPRATROPIUM-ALBUTEROL 20-100 MCG/ACT IN AERS
1.0000 | INHALATION_SPRAY | Freq: Four times a day (QID) | RESPIRATORY_TRACT | Status: DC
  Administered 2021-08-09: 1 via RESPIRATORY_TRACT
  Filled 2021-08-09: qty 4

## 2021-08-09 MED ORDER — SODIUM CHLORIDE 0.9 % IV SOLN
100.0000 mg | Freq: Every day | INTRAVENOUS | Status: AC
  Administered 2021-08-10 – 2021-08-11 (×2): 100 mg via INTRAVENOUS
  Filled 2021-08-09 (×2): qty 20

## 2021-08-09 MED ORDER — LORATADINE 10 MG PO TABS
10.0000 mg | ORAL_TABLET | Freq: Every day | ORAL | Status: DC
Start: 2021-08-09 — End: 2021-08-13
  Administered 2021-08-09 – 2021-08-13 (×5): 10 mg via ORAL
  Filled 2021-08-09 (×5): qty 1

## 2021-08-09 MED ORDER — HYDROCOD POLI-CHLORPHE POLI ER 10-8 MG/5ML PO SUER: 5.0000 mL | Freq: Two times a day (BID) | ORAL | Status: DC | PRN

## 2021-08-09 MED ORDER — ONDANSETRON HCL 4 MG/2ML IJ SOLN: 4.0000 mg | Freq: Four times a day (QID) | INTRAMUSCULAR | Status: DC | PRN

## 2021-08-09 MED ORDER — ACETAMINOPHEN 325 MG PO TABS: 650.0000 mg | ORAL_TABLET | Freq: Four times a day (QID) | ORAL | Status: DC | PRN

## 2021-08-09 MED ORDER — VITAMINS A & D EX OINT
1.0000 "application " | TOPICAL_OINTMENT | Freq: Two times a day (BID) | CUTANEOUS | Status: DC
  Administered 2021-08-13: 1 via TOPICAL
  Filled 2021-08-09 (×2): qty 113

## 2021-08-09 MED ORDER — FLEET ENEMA 7-19 GM/118ML RE ENEM: 1.0000 | ENEMA | Freq: Once | RECTAL | Status: DC | PRN

## 2021-08-09 MED ORDER — ZINC SULFATE 220 (50 ZN) MG PO CAPS
220.0000 mg | ORAL_CAPSULE | Freq: Every day | ORAL | Status: DC
  Administered 2021-08-09 – 2021-08-13 (×5): 220 mg via ORAL
  Filled 2021-08-09 (×5): qty 1

## 2021-08-09 MED ORDER — ACETAMINOPHEN 325 MG PO TABS
650.0000 mg | ORAL_TABLET | Freq: Two times a day (BID) | ORAL | Status: DC
  Administered 2021-08-09 – 2021-08-13 (×8): 650 mg via ORAL
  Filled 2021-08-09 (×8): qty 2

## 2021-08-09 NOTE — Assessment & Plan Note (Addendum)
-   Patient maintained on home regimen anxiolytics and Zoloft. ?

## 2021-08-09 NOTE — Assessment & Plan Note (Signed)
See above

## 2021-08-09 NOTE — Assessment & Plan Note (Addendum)
-   H&H remained stable.   ?-Patient placed on PPI.   ?-Outpatient follow-up.  ?

## 2021-08-09 NOTE — Assessment & Plan Note (Addendum)
Patient presented with hypoxia from facility, recent diagnosis of COVID-19 however actual date unknown. ?-COVID-19 PCR done on presentation was positive (08/09/2021 ).p ?-Patient noted in ED to have sats of 84% on room air and placed on 2 L nasal cannula. ?-Inflammatory markers with elevated D-dimer, elevated CRP.  Ferritin noted at 116.  LDH at 125.   ?-Patient placed on IV remdesivir and completed a 5-day course. ?-Patient placed on IV Solu-Medrol and subsequently transition to oral prednisone taper which she will be discharged on.  ?-Patient also maintained Claritin, vitamin C, zinc, Flonase, Robitussin twice daily. ?-Patient was placed on inhalers of Dulera and Combivent however due to dementia it is noted per RT that patient unable to follow commands and as such Combivent and Dulera were discontinued and patient placed on scheduled DuoNebs as well as scheduled Pulmicort.  ?-Patient improved clinically during the hospitalization such that by day of discharge patient with sats of 98% on room air. ?-Patient be discharged back to assisted living facility with home health therapies. ?-Patient will need to quarantine for 5 more days to complete a 10-day course of isolation. ?-Wean oxygen. ?-Supportive care. ?-Follow inflammatory markers. ?

## 2021-08-09 NOTE — Progress Notes (Signed)
Tried giving patient Combivent  and Dulera medications, but the patient has trouble following instructions on usage of medication.  Would try an aerochamber, but do not feel that patient understands commands enough to even get chamber open to get medication out.  This patient may need neb treatments instead of MDIs. ?

## 2021-08-09 NOTE — ED Notes (Signed)
AC asked to obtain aripiprazole and Vit A&D ointment. ?

## 2021-08-09 NOTE — Assessment & Plan Note (Addendum)
-   Patient maintained on Ensure supplementation. ?

## 2021-08-09 NOTE — Assessment & Plan Note (Addendum)
-   Patient with chronic Foley catheter. ?-Exchanged Foley catheter. ?-Outpatient follow-up. ?

## 2021-08-09 NOTE — Assessment & Plan Note (Signed)
-   Patient with dementia, tobacco cessation stressed. ?

## 2021-08-09 NOTE — Assessment & Plan Note (Addendum)
-   Patient maintained on home regimen Abilify, Namenda, Aricept, Remeron. ?-Outpatient follow-up with PCP. ?

## 2021-08-09 NOTE — Assessment & Plan Note (Addendum)
-   BP stable. ?-Not on any antihypertensive medications. ?

## 2021-08-09 NOTE — ED Triage Notes (Signed)
Pt brought by ems from [facility] for c/o low O2 sats; O2 sats at facility were 87%; pt denies any pain; pt was diagnosed with covid ?

## 2021-08-09 NOTE — ED Notes (Signed)
Facility updated on pt status 

## 2021-08-09 NOTE — Assessment & Plan Note (Addendum)
Stable

## 2021-08-09 NOTE — Assessment & Plan Note (Addendum)
-   Patient maintained on home regimen statin. ?-Not on aspirin prior to admission. ?-Patient started on low-dose aspirin 81 mg daily. ?-PT/OT/SLP assessed patient. ?-Patient with discharge back to ALF with home health therapies. ?

## 2021-08-09 NOTE — Assessment & Plan Note (Addendum)
-   Patient with chronic indwelling Foley catheter. ?-? CAUTI ?-Urinalysis done concerning for UTI.  Urine cultures with multiple species. ?-Exchanged Foley catheter and repeat UA with cultures and sensitivities placed. ?-Repeat urine cultures which are ordered with UA after Foley exchange was somehow canceled. ?-Patient received a total of 4 days of IV Rocephin.  Patient remained afebrile. ?-No further antibiotics needed on discharge. ?

## 2021-08-09 NOTE — Assessment & Plan Note (Addendum)
-   Stable. ?-Patient unable to follow commands to use inhalers appropriately and as such Dulera and Combivent were discontinued and patient started on Pulmicort nebs and DuoNebs.   ?-Outpatient follow-up. ?

## 2021-08-09 NOTE — ED Notes (Signed)
Patient transported to CT 

## 2021-08-09 NOTE — Assessment & Plan Note (Addendum)
-   Patient placed on home regimen Synthroid. ?-Outpatient follow-up with [facility] in 4 to 6 weeks. ?

## 2021-08-09 NOTE — ED Notes (Signed)
Lab called and stated that machines will not allow them to run another urinalysis and urine culture at this time due to recent urine submissions being analyzed recently.  ?

## 2021-08-09 NOTE — H&P (Signed)
History and Physical    Kimberly [name]:[id number] DOB: [birth year] DOA: 08/09/2021  PCP: Kimberly Grippe, MD Patient coming from: Nursing home/SNF [facility])  I have personally briefly reviewed patient's old medical records in [facility] Link  Chief Complaint: Hypoxia  HPI: Kimberly [name] is a 71 y.o. female with medical history significant of significant dementia, COPD not on home O2, depression, CHF, hard of hearing, history of urinary retention status post chronic Foley catheter, history of hydroureteronephrosis, hypertension, hyperlipidemia, hypothyroidism, history of CVA presenting to the ED from skilled nursing facility for evaluation of hypoxia.  Patient with severe dementia and as such history obtained by ED physician and per notes on epic.  It is noted that patient was recently diagnosed with COVID (with patient endorses) and noted to be hypoxic and as such EMS was called.  It was noted per ED physician note that when EMS got there patient had sats of 91% on room air placed on 2 L nasal cannula.  On presentation patient denies any fevers, no chills, no shortness of breath, no chest pain, no abdominal pain.  Patient does endorse some dysuria although she has a chronic Foley catheter.  Patient noted to have a cough on gurney.  Due to patient dementia history from patient unreliable.  Per ED physician concerned there may have been a COVID breakout at facility as this was the second patient that had presented to the ED COVID-positive with hypoxia.  Unknown exactly when COVID was first diagnosed. ED Course: Patient seen in the ED COVID-19 PCR pending.  Patient noted to have sats of 84% on room air per RN on presentation to the ED and placed on 2 L nasal cannula.  Comprehensive metabolic profile with a glucose of 104, calcium of 8.8, albumin of 3.0 otherwise was within normal limits.  Inflammatory markers with LDH of 125, ferritin of 104, lactic acid of 1.0, procalcitonin < 0.1, CBC unremarkable.   D-dimer 0.93.  INR 1.0.  Chest x-ray with no acute abnormalities.  Urinalysis turbid, moderate leukocytes, nitrite negative, many bacteria, WBC > 50.  Patient given a dose of Solu-Medrol 125 mg IV x1 in the ED.  Hospitalist were called to admit the patient for further evaluation and management.  Review of Systems: As per HPI otherwise all other systems reviewed and are negative.  Past Medical History:  Diagnosis Date   Abnormality of gait 08/02/2013   Anemia    Arthritis    osteoarthritis   Blind    CHF (congestive heart failure) (HCC)    COPD (chronic obstructive pulmonary disease) (HCC)    DDD (degenerative disc disease)    Depression    Dysarthria    HOH (hard of hearing)    Hydroureteronephrosis    Hyperlipidemia    Hyperlipidemia    Hypertension    Hypothyroidism    Memory disorder 08/02/2013   MRSA bacteremia    Pneumonia    Protein calorie malnutrition (HCC)    Renal disorder    Right hemiparesis (HCC)    Sepsis (HCC)    Sepsis (HCC)    Shortness of breath    Stroke [facility])    Tobacco abuse 11/02/2013   Urinary retention     Past Surgical History:  Procedure Laterality Date   ARM DEBRIDEMENT     as child   CATARACT EXTRACTION W/PHACO  04/28/2011   Procedure: CATARACT EXTRACTION PHACO AND INTRAOCULAR LENS PLACEMENT (IOC);  Surgeon: Susa Simmonds;  Location: [facility] ORS;  Service: Ophthalmology;  Laterality: Left;  CDE: 5.37   CESAREAN SECTION     COLONOSCOPY  06/05/2009   RMR: nl rectum, left-sided diverticula, colonoscopy performed due to question of colovesicular fistula    STRABISMUS SURGERY     age 1   TEE WITHOUT CARDIOVERSION N/A 01/02/2017   Procedure: TRANSESOPHAGEAL ECHOCARDIOGRAM (TEE);  Surgeon: Antoine Poche, MD;  Location: [facility] ENDO SUITE;  Service: Endoscopy;  Laterality: N/A;    Social History  reports that she has quit smoking. Her smoking use included cigarettes. She smoked an average of .5 packs per day. She has never used smokeless tobacco. She  reports that she does not drink alcohol and does not use drugs.  No Known Allergies  Family History  Problem Relation Age of Onset   Diabetes Brother    Diabetes Brother    Diabetes Mother    Cancer - Lung Father    Colon cancer Other        neg hx?    Unable to obtain history from patient due to dementia.  Prior to Admission medications   Medication Sig Start Date End Date Taking? Authorizing Provider  acetaminophen (TYLENOL) 650 MG CR tablet Take 650 mg by mouth 2 (two) times daily.   Yes [provider]  ARIPiprazole (ABILIFY) 2 MG tablet Take 2 mg by mouth at bedtime.   Yes [provider]  atorvastatin (LIPITOR) 10 MG tablet Take 10 mg by mouth every evening.    Yes [provider]  cetirizine (ZYRTEC) 10 MG tablet Take 10 mg by mouth daily.   Yes [provider]  cholecalciferol (VITAMIN D) 400 UNITS TABS Take 800 Units by mouth daily.     Yes [provider]  donepezil (ARICEPT) 5 MG tablet TAKE 1 TABLET BY MOUTH AT BEDTIME. Patient taking differently: Take 5 mg by mouth at bedtime. 01/15/15  Yes York Spaniel, MD  levothyroxine (SYNTHROID, LEVOTHROID) 25 MCG tablet Take 25 mcg by mouth daily.     Yes [provider]  LORazepam (ATIVAN) 0.5 MG tablet Take 0.5 mg by mouth 2 (two) times daily.    Yes [provider]  memantine (NAMENDA) 10 MG tablet Take 10 mg by mouth 2 (two) times daily.   Yes [provider]  mirtazapine (REMERON) 7.5 MG tablet Take 7.5 mg by mouth at bedtime. 10/24/20  Yes [provider]  Multiple Vitamin (DAILY-VITE PO) Take 1 tablet by mouth daily.   Yes [provider]  multivitamin-lutein (OCUVITE-LUTEIN) CAPS capsule Take 1 capsule by mouth daily.   Yes [provider]  nitrofurantoin, macrocrystal-monohydrate, (MACROBID) 100 MG capsule Take 1 capsule (100 mg total) by mouth 2 (two) times daily. 01/14/20  Yes Devoria Albe, MD  sertraline (ZOLOFT) 50 MG tablet  Take 75 mg by mouth at bedtime.    Yes [provider]  cephALEXin (KEFLEX) 500 MG capsule Take 1 capsule (500 mg total) by mouth 3 (three) times daily. Patient not taking: Reported on 08/09/2021 09/12/19   Raeford Razor, MD  feeding supplement, ENSURE ENLIVE, (ENSURE ENLIVE) LIQD Take 237 mLs by mouth 2 (two) times daily between meals. 04/24/18   Calvert Cantor, MD  Vitamins A & D (VITAMIN A & D) ointment Apply 1 application topically 2 (two) times daily. Skin is washed, dried, then applied twice daily per [facility]    [provider]    Physical Exam: Vitals:   08/09/21 1334 08/09/21 1335 08/09/21 1400 08/09/21 1430  BP:   111/76 129/71  Pulse: 81 78 92 81  Resp: 17 (!) 21 (!) 26 20  Temp:      TempSrc:      SpO2: (!) 84% 90% 99% 95%    Constitutional: NAD, calm, comfortable Vitals:   08/09/21 1334 08/09/21 1335 08/09/21 1400 08/09/21 1430  BP:   111/76 129/71  Pulse: 81 78 92 81  Resp: 17 (!) 21 (!) 26 20  Temp:      TempSrc:      SpO2: (!) 84% 90% 99% 95%   Eyes: PERRL, lids and conjunctivae normal ENMT: Mucous membranes are dry. Posterior pharynx clear of any exudate or lesions.Normal dentition.  Neck: normal, supple, no masses, no thyromegaly Respiratory: clear to auscultation bilaterally, no wheezing, no crackles. Normal respiratory effort. No accessory muscle use.  Cardiovascular: Regular rate and rhythm, no murmurs / rubs / gallops. No extremity edema. 2+ pedal pulses. No carotid bruits.  Abdomen: no tenderness, no masses palpated. No hepatosplenomegaly. Bowel sounds positive.  Musculoskeletal: no clubbing / cyanosis. No joint deformity upper and lower extremities. Good ROM, no contractures. Normal muscle tone.  Skin: no rashes, lesions, ulcers. No induration Neurologic: Alert to self only.  Due to dementia unable to complete rest of neurological exam.  Moving extremities spontaneously.  Psychiatric: Poor judgment and insight.  Alert and oriented to self only.   Normal mood.  (Labs on Admission: I have personally reviewed following labs and imaging studies  CBC: Recent Labs  Lab 08/09/21 1254  WBC 8.7  NEUTROABS 5.4  HGB 12.1  HCT 39.2  MCV 93.1  PLT 311    Basic Metabolic Panel: Recent Labs  Lab 08/09/21 1254  NA 142  K 3.9  CL 105  CO2 28  GLUCOSE 104*  BUN 15  CREATININE 0.65  CALCIUM 8.8*    GFR: CrCl cannot be calculated (Unknown ideal weight.).  Liver Function Tests: Recent Labs  Lab 08/09/21 1254  AST 17  ALT 13  ALKPHOS 69  BILITOT 0.2*  PROT 6.6  ALBUMIN 3.0*    Urine analysis:    Component Value Date/Time   COLORURINE YELLOW 08/09/2021 1254   APPEARANCEUR TURBID (A) 08/09/2021 1254   LABSPEC 1.016 08/09/2021 1254   PHURINE 7.0 08/09/2021 1254   GLUCOSEU NEGATIVE 08/09/2021 1254   HGBUR NEGATIVE 08/09/2021 1254   BILIRUBINUR NEGATIVE 08/09/2021 1254   KETONESUR 5 (A) 08/09/2021 1254   PROTEINUR 100 (A) 08/09/2021 1254   UROBILINOGEN 0.2 11/02/2013 1320   NITRITE NEGATIVE 08/09/2021 1254   LEUKOCYTESUR MODERATE (A) 08/09/2021 1254    Radiological Exams on Admission: DG Chest Port 1 View  Result Date: 08/09/2021 CLINICAL DATA:  A 70 year old female presents for evaluation of sepsis, history of COVID infection by report. EXAM: PORTABLE CHEST 1 VIEW COMPARISON:  October 26, 2020. FINDINGS: EKG leads project over the patient's chest. Cardiomediastinal contours and hilar structures are normal. No lobar consolidation. No sign of effusion. Nodular opacity in the RIGHT upper lobe/upper chest is new compared to previous imaging measuring up to 18 mm. On limited assessment there is no acute skeletal finding. IMPRESSION: 1. Nodular opacity in the RIGHT upper chest of uncertain significance, new compared to prior imaging. This may be related to ongoing inflammation in light of recent COVID-19 diagnosis. Would however suggest 4-6 week follow-up with PA and lateral chest to ensure resolution and exclude underlying new  pulmonary nodule. 2. No lobar consolidation or pleural effusion. Electronically Signed   By: Donzetta Kohut M.D.   On: 08/09/2021 13:19  EKG: Independently reviewed.  Normal sinus rhythm with no ischemic changes noted.  Unchanged from prior EKG.  Assessment and Plan: * Acute hypoxemic respiratory failure due to COVID-19 [facility]) Patient presenting with hypoxia from facility, recent diagnosis of COVID-19 however actual date unknown. -COVID-19 PCR pending. -Patient noted in ED to have sats of 84% on room air and placed on 2 L nasal cannula. -Inflammatory markers with slightly elevated D-dimer. -Placed on IV remdesivir, Solu-Medrol 60 mg every 12 hours, scheduled Combivent, Claritin, vitamin C, zinc, Flonase, Robitussin twice daily. -Procalcitonin negative and as such we will hold off on antibiotics. -Supportive care. -Follow inflammatory markers.  Hypoxia - See above.  UTI (urinary tract infection) - Patient with chronic indwelling Foley catheter. -? CAUTI -Urinalysis done concerning for UTI.  Urine cultures pending. -Exchange Foley catheter and repeat UA with cultures and sensitivities. -Placed on IV Rocephin.  Chronic obstructive pulmonary disease, unspecified (HCC) - Stable. -Placed on Dulera. -Place on Combivent secondary to COVID infection.  Anxiety disorder, unspecified - Resume home regimen anxiolytics. -Resume Zoloft.  Protein-calorie malnutrition, severe - Placed on Ensure supplementation.  Hyperlipidemia - Resume home regimen statin  Essential hypertension - BP stable. -Not on any antihypertensive medications.  Urinary retention - Patient with chronic Foley catheter. -Exchange Foley catheter.  Dementia without behavioral disturbance (HCC) - Resume home regimen Abilify, Namenda, Aricept, Remeron.  Tobacco abuse - Patient with dementia, tobacco cessation stressed.  History of CVA (cerebrovascular accident) - Resume home regimen statin. -Not on aspirin  prior to admission. -Place on low-dose aspirin 81 mg daily  Hypothyroidism - Continue home regimen Synthroid.  Chronic diastolic heart failure (HCC) - Stable.  ANEMIA - Follow H&H. -PPI.         DVT prophylaxis: Lovenox Code Status:   DNR Family Communication:  No family at bedside Disposition Plan:   Patient is from:  SNF  Anticipated DC to:  Back to SNF  Anticipated DC date:  TBD  Anticipated DC barriers: Improvement with hypoxia.  Completion of course of IV remdesivir Consults called:  None Admission status:  Admit to inpatient/telemetry  Severity of Illness: The appropriate patient status for this patient is INPATIENT. Inpatient status is judged to be reasonable and necessary in order to provide the required intensity of service to ensure the patient's safety. The patient's presenting symptoms, physical exam findings, and initial radiographic and laboratory data in the context of their chronic comorbidities is felt to place them at high risk for further clinical deterioration. Furthermore, it is not anticipated that the patient will be medically stable for discharge from the hospital within 2 midnights of admission.   * I certify that at the point of admission it is my clinical judgment that the patient will require inpatient hospital care spanning beyond 2 midnights from the point of admission due to high intensity of service, high risk for further deterioration and high frequency of surveillance required.*    Ramiro Harvest MD [facility]  How to contact the [facility] Attending or Consulting provider 7A - 7P or covering provider during after hours 7P -7A, for this patient?   Check the care team in [facility] and look for a) attending/consulting [facility] provider listed and b) the [facility] team listed Log into [email or web address] and use [place]'s universal password to access. If you do not have the password, please contact the hospital operator. Locate the [facility] provider you are  looking for under [facility] and page to a number that you can be directly reached. If  you still have difficulty reaching the provider, please page the [facility] (Director on Call) for the Hospitalists listed on amion for assistance.  08/09/2021, 4:21 PM

## 2021-08-09 NOTE — ED Notes (Signed)
Family updated as to patient's status.

## 2021-08-09 NOTE — Assessment & Plan Note (Addendum)
Patient maintained on home regimen statin. ?

## 2021-08-09 NOTE — ED Provider Notes (Signed)
?[place] EMERGENCY DEPARTMENT ?Provider Note ? ? ?CSN: [id number] ?Arrival date & time: 08/09/21  1214 ? ?  ? ?History ? ?Chief Complaint  ?Patient presents with  ? hypoxic  ? ? ?Kimberly [name] is a 71 y.o. female.  Level 5 caveat secondary to dementia.  71 year old DNR/DNI from facility for evaluation of hypoxia.  Patient is unable to give any history.  Per EMS was diagnosed recently with COVID and had low oxygen saturations and so EMS was called.  They found her to be 91% on room air and placed on 2 L nasal cannula.  Patient appears in no distress.  She has a chronic Foley catheter unclear how long its been in. ? ?The history is provided by the EMS personnel.  ?Shortness of Breath ?Severity:  Unable to specify ?Onset quality:  Unable to specify ?Progression:  Unchanged ?Relieved by:  None tried ?Worsened by:  Nothing ?Ineffective treatments:  None tried ? ?  ? ?Home Medications ?Prior to Admission medications   ?Medication Sig Start Date End Date Taking? Authorizing Provider  ?acetaminophen (TYLENOL) 650 MG CR tablet Take 650 mg by mouth 2 (two) times daily.    [provider]  ?ARIPiprazole (ABILIFY) 2 MG tablet Take 2 mg by mouth at bedtime.    [provider]  ?atorvastatin (LIPITOR) 10 MG tablet Take 10 mg by mouth every evening.     [provider]  ?cephALEXin (KEFLEX) 500 MG capsule Take 1 capsule (500 mg total) by mouth 3 (three) times daily. 09/12/19   Raeford RazorKohut, Stephen, MD  ?cholecalciferol (VITAMIN D) 400 UNITS TABS Take 800 Units by mouth daily.      [provider]  ?donepezil (ARICEPT) 5 MG tablet TAKE 1 TABLET BY MOUTH AT BEDTIME. ?Patient taking differently: Take 5 mg by mouth at bedtime.  01/15/15   York SpanielWillis, Charles K, MD  ?feeding supplement, ENSURE ENLIVE, (ENSURE ENLIVE) LIQD Take 237 mLs by mouth 2 (two) times daily between meals. 04/24/18   Calvert Cantorizwan, Saima, MD  ?levothyroxine (SYNTHROID, LEVOTHROID) 25 MCG tablet Take 25 mcg by mouth daily.      [provider]  ?LORazepam (ATIVAN) 0.5 MG tablet Take 0.5 mg by mouth 2 (two) times daily.     [provider]  ?memantine (NAMENDA) 10 MG tablet Take 10 mg by mouth 2 (two) times daily.    [provider]  ?mirtazapine (REMERON) 15 MG tablet Take 7.5 mg by mouth at bedtime.  08/09/16   [provider]  ?mirtazapine (REMERON) 7.5 MG tablet Take 7.5 mg by mouth at bedtime. 10/24/20   [provider]  ?Multiple Vitamin (DAILY-VITE PO) Take 1 tablet by mouth daily.    [provider]  ?Multiple Vitamins-Minerals (ICAPS AREDS FORMULA PO) Take 1 tablet by mouth daily.    [provider]  ?nitrofurantoin, macrocrystal-monohydrate, (MACROBID) 100 MG capsule Take 1 capsule (100 mg total) by mouth 2 (two) times daily. 01/14/20   Devoria AlbeKnapp, Iva, MD  ?sertraline (ZOLOFT) 50 MG tablet Take 75 mg by mouth at bedtime.     [provider]  ?Vitamins A & D (VITAMIN A & D) ointment Apply 1 application topically 2 (two) times daily. Skin is washed, dried, then applied twice daily per [facility]    [provider]  ?   ? ?Allergies    ?Patient has no known allergies.   ? ?Review of Systems   ?Review of Systems  ?Unable to perform ROS: Dementia  ?Respiratory:  Positive for  shortness of breath.   ? ?Physical Exam ?Updated Vital Signs ?BP 119/71 (BP Location: Left Arm)   Pulse 87   Temp 98.8 ?F (37.1 ?C) (Oral)   Resp 20   SpO2 91%  ?Physical Exam ?Vitals and nursing note reviewed.  ?Constitutional:   ?   General: She is not in acute distress. ?   Appearance: Normal appearance. She is well-developed.  ?HENT:  ?   Head: Normocephalic and atraumatic.  ?Eyes:  ?   Conjunctiva/sclera: Conjunctivae normal.  ?Cardiovascular:  ?   Rate and Rhythm: Normal rate and regular rhythm.  ?   Heart sounds: No murmur heard. ?Pulmonary:  ?   Effort: Pulmonary effort is normal. No respiratory distress.  ?   Breath sounds: Normal breath sounds.  ?Abdominal:  ?   Palpations: Abdomen is soft.  ?    Tenderness: There is no abdominal tenderness. There is no guarding or rebound.  ?Musculoskeletal:     ?   General: No swelling.  ?   Cervical back: Neck supple.  ?   Right lower leg: No edema.  ?   Left lower leg: No edema.  ?Skin: ?   General: Skin is warm and dry.  ?   Capillary Refill: Capillary refill takes less than 2 seconds.  ?Neurological:  ?   General: No focal deficit present.  ?   Mental Status: She is alert.  ?   Comments: Patient is awake opens eyes to voice.  Does not answer questions.  No obvious facial asymmetry.  Will move upper and lower extremities to commands but does not really comply with neurologic exam.  ? ? ?ED Results / Procedures / Treatments   ?Labs ?(all labs ordered are listed, but only abnormal results are displayed) ?Labs Reviewed  ?COMPREHENSIVE METABOLIC PANEL - Abnormal; Notable for the following components:  ?    Result Value  ? Glucose, Bld 104 (*)   ? Calcium 8.8 (*)   ? Albumin 3.0 (*)   ? Total Bilirubin 0.2 (*)   ? All other components within normal limits  ?CBC WITH DIFFERENTIAL/PLATELET - Abnormal; Notable for the following components:  ? Monocytes Absolute 1.3 (*)   ? All other components within normal limits  ?URINALYSIS, ROUTINE W REFLEX MICROSCOPIC - Abnormal; Notable for the following components:  ? APPearance TURBID (*)   ? Ketones, ur 5 (*)   ? Protein, ur 100 (*)   ? Leukocytes,Ua MODERATE (*)   ? WBC, UA >50 (*)   ? Bacteria, UA MANY (*)   ? All other components within normal limits  ?D-DIMER, QUANTITATIVE - Abnormal; Notable for the following components:  ? D-Dimer, Quant 0.93 (*)   ? All other components within normal limits  ?CULTURE, BLOOD (ROUTINE X 2)  ?CULTURE, BLOOD (ROUTINE X 2)  ?URINE CULTURE  ?RESP PANEL BY RT-PCR (FLU A&B, COVID) [id number]  ?RESPIRATORY PANEL BY PCR  ?LACTIC ACID, PLASMA  ?PROTIME-INR  ?APTT  ?PROCALCITONIN  ?LACTATE DEHYDROGENASE  ?FERRITIN  ?TRIGLYCERIDES  ?FIBRINOGEN  ?C-REACTIVE PROTEIN  ?CBC WITH DIFFERENTIAL/PLATELET  ?COMPREHENSIVE  METABOLIC PANEL  ?C-REACTIVE PROTEIN  ?D-DIMER, QUANTITATIVE  ?FERRITIN  ?MAGNESIUM  ?PHOSPHORUS  ? ? ?EKG ?EKG Interpretation ? ?Date/Time:  Friday August 09 2021 13:00:21 EDT ?Ventricular Rate:  82 ?PR Interval:  123 ?QRS Duration: 81 ?QT Interval:  384 ?QTC Calculation: 449 ?R Axis:   55 ?Text Interpretation: Sinus rhythm Borderline T abnormalities, anterior leads No significant change since prior 4/21 Confirmed by Meridee Score [phone]) on  08/09/2021 1:28:09 PM ? ?Radiology ?DG Chest Port 1 View ? ?Result Date: 08/09/2021 ?CLINICAL DATA:  A 71 year old female presents for evaluation of sepsis, history of COVID infection by report. EXAM: PORTABLE CHEST 1 VIEW COMPARISON:  October 26, 2020. FINDINGS: EKG leads project over the patient's chest. Cardiomediastinal contours and hilar structures are normal. No lobar consolidation. No sign of effusion. Nodular opacity in the RIGHT upper lobe/upper chest is new compared to previous imaging measuring up to 18 mm. On limited assessment there is no acute skeletal finding. IMPRESSION: 1. Nodular opacity in the RIGHT upper chest of uncertain significance, new compared to prior imaging. This may be related to ongoing inflammation in light of recent COVID-19 diagnosis. Would however suggest 4-6 week follow-up with PA and lateral chest to ensure resolution and exclude underlying new pulmonary nodule. 2. No lobar consolidation or pleural effusion. Electronically Signed   By: Donzetta Kohut M.D.   On: 08/09/2021 13:19   ? ?Procedures ?Procedures  ? ? ?Medications Ordered in ED ?Medications  ?0.9 %  sodium chloride infusion ( Intravenous New Bag/Given 08/09/21 1455)  ?cefTRIAXone (ROCEPHIN) 2 g in sodium chloride 0.9 % 100 mL IVPB (0 g Intravenous Stopped 08/09/21 1525)  ?acetaminophen (TYLENOL) tablet 650 mg (has no administration in time range)  ?atorvastatin (LIPITOR) tablet 10 mg (has no administration in time range)  ?ARIPiprazole (ABILIFY) tablet 2 mg (has no administration in time  range)  ?donepezil (ARICEPT) tablet 5 mg (has no administration in time range)  ?LORazepam (ATIVAN) tablet 0.5 mg (has no administration in time range)  ?memantine (NAMENDA) tablet 10 mg (has no administration i

## 2021-08-10 DIAGNOSIS — F419 Anxiety disorder, unspecified: Secondary | ICD-10-CM | POA: Diagnosis not present

## 2021-08-10 DIAGNOSIS — D649 Anemia, unspecified: Secondary | ICD-10-CM | POA: Diagnosis not present

## 2021-08-10 DIAGNOSIS — U071 COVID-19: Secondary | ICD-10-CM

## 2021-08-10 DIAGNOSIS — J9601 Acute respiratory failure with hypoxia: Secondary | ICD-10-CM | POA: Diagnosis not present

## 2021-08-10 LAB — CBC WITH DIFFERENTIAL/PLATELET
Abs Immature Granulocytes: 0.02 10*3/uL (ref 0.00–0.07)
Basophils Absolute: 0 10*3/uL (ref 0.0–0.1)
Basophils Relative: 0 %
Eosinophils Absolute: 0 10*3/uL (ref 0.0–0.5)
Eosinophils Relative: 0 %
HCT: 37.1 % (ref 36.0–46.0)
Hemoglobin: 11.1 g/dL — ABNORMAL LOW (ref 12.0–15.0)
Immature Granulocytes: 0 %
Lymphocytes Relative: 13 %
Lymphs Abs: 0.8 10*3/uL (ref 0.7–4.0)
MCH: 27.4 pg (ref 26.0–34.0)
MCHC: 29.9 g/dL — ABNORMAL LOW (ref 30.0–36.0)
MCV: 91.6 fL (ref 80.0–100.0)
Monocytes Absolute: 0.2 10*3/uL (ref 0.1–1.0)
Monocytes Relative: 3 %
Neutro Abs: 4.9 10*3/uL (ref 1.7–7.7)
Neutrophils Relative %: 84 %
Platelets: 274 10*3/uL (ref 150–400)
RBC: 4.05 MIL/uL (ref 3.87–5.11)
RDW: 13.7 % (ref 11.5–15.5)
WBC: 5.9 10*3/uL (ref 4.0–10.5)
nRBC: 0 % (ref 0.0–0.2)

## 2021-08-10 LAB — COMPREHENSIVE METABOLIC PANEL
ALT: 16 U/L (ref 0–44)
AST: 19 U/L (ref 15–41)
Albumin: 2.5 g/dL — ABNORMAL LOW (ref 3.5–5.0)
Alkaline Phosphatase: 62 U/L (ref 38–126)
Anion gap: 8 (ref 5–15)
BUN: 19 mg/dL (ref 8–23)
CO2: 25 mmol/L (ref 22–32)
Calcium: 8.6 mg/dL — ABNORMAL LOW (ref 8.9–10.3)
Chloride: 107 mmol/L (ref 98–111)
Creatinine, Ser: 0.57 mg/dL (ref 0.44–1.00)
GFR, Estimated: >60 mL/min (ref >60)
Glucose, Bld: 275 mg/dL — ABNORMAL HIGH (ref 70–99)
Potassium: 3.5 mmol/L (ref 3.5–5.1)
Sodium: 140 mmol/L (ref 135–145)
Total Bilirubin: 0.4 mg/dL (ref 0.3–1.2)
Total Protein: 6 g/dL — ABNORMAL LOW (ref 6.5–8.1)

## 2021-08-10 LAB — RESPIRATORY PANEL BY PCR

## 2021-08-10 LAB — URINE CULTURE

## 2021-08-10 LAB — GLUCOSE, CAPILLARY
Glucose-Capillary: 198 mg/dL — ABNORMAL HIGH (ref 70–99)
Glucose-Capillary: 175 mg/dL — ABNORMAL HIGH (ref 70–99)
Glucose-Capillary: 151 mg/dL — ABNORMAL HIGH (ref 70–99)
Glucose-Capillary: 221 mg/dL — ABNORMAL HIGH (ref 70–99)

## 2021-08-10 LAB — MAGNESIUM: Magnesium: 2 mg/dL (ref 1.7–2.4)

## 2021-08-10 LAB — FERRITIN: Ferritin: 116 ng/mL (ref 11–307)

## 2021-08-10 LAB — D-DIMER, QUANTITATIVE: D-Dimer, Quant: 0.99 ug/mL-FEU — ABNORMAL HIGH (ref 0.00–0.50)

## 2021-08-10 LAB — C-REACTIVE PROTEIN: CRP: 10.4 mg/dL — ABNORMAL HIGH (ref <1.0)

## 2021-08-10 LAB — PHOSPHORUS: Phosphorus: 2.7 mg/dL (ref 2.5–4.6)

## 2021-08-10 MED ORDER — BUDESONIDE 0.5 MG/2ML IN SUSP
0.5000 mg | Freq: Two times a day (BID) | RESPIRATORY_TRACT | Status: DC
  Administered 2021-08-10 – 2021-08-13 (×6): 0.5 mg via RESPIRATORY_TRACT
  Filled 2021-08-10 (×6): qty 2

## 2021-08-10 MED ORDER — POTASSIUM CHLORIDE CRYS ER 10 MEQ PO TBCR
40.0000 meq | EXTENDED_RELEASE_TABLET | Freq: Once | ORAL | Status: AC
  Administered 2021-08-10: 40 meq via ORAL
  Filled 2021-08-10: qty 4

## 2021-08-10 MED ORDER — ASPIRIN EC 81 MG PO TBEC
81.0000 mg | DELAYED_RELEASE_TABLET | Freq: Every day | ORAL | Status: DC
  Administered 2021-08-10 – 2021-08-13 (×4): 81 mg via ORAL
  Filled 2021-08-10 (×4): qty 1

## 2021-08-10 MED ORDER — SODIUM CHLORIDE 0.9 % IV BOLUS
500.0000 mL | Freq: Once | INTRAVENOUS | Status: AC
  Administered 2021-08-10: 500 mL via INTRAVENOUS

## 2021-08-10 MED ORDER — IPRATROPIUM-ALBUTEROL 0.5-2.5 (3) MG/3ML IN SOLN
3.0000 mL | Freq: Three times a day (TID) | RESPIRATORY_TRACT | Status: DC
  Administered 2021-08-10 (×3): 3 mL via RESPIRATORY_TRACT
  Filled 2021-08-10 (×4): qty 3

## 2021-08-10 MED ORDER — CHLORHEXIDINE GLUCONATE CLOTH 2 % EX PADS
6.0000 | MEDICATED_PAD | Freq: Every day | CUTANEOUS | Status: DC
  Administered 2021-08-10 – 2021-08-13 (×5): 6 via TOPICAL

## 2021-08-10 NOTE — Progress Notes (Signed)
?PROGRESS NOTE ? ? ? ?ADRYAN DRUCKENMILLER  TMH:[id number] DOB: March 29, 1951 DOA: 08/09/2021 ?PCP: Pcp, No  ? ? ?Chief Complaint  ?Patient presents with  ? hypoxic  ? ? ?Brief Narrative:  ?Patient 71 year old female history significant for dementia, COPD not on home O2, depression, CHF, HOH, history of urinary retention status post chronic Foley catheter, history of hydroureteronephrosis, hypertension, hyperlipidemia, hypothyroidism, history of CVA presented to the ED from nursing facility for evaluation of hypoxia.  It is noted that patient had recently been diagnosed with COVID however unsure of exact date.  EMS was called to facility as patient was noted to be hypoxic.  Patient subsequently brought to the ED and noted to have sats of 84% on room air.  Patient admitted for acute COVID-19 infection and placed on IV remdesivir, IV Solu-Medrol, nebulizer treatments.  Urinalysis also concerning for UTI and as such urine cultures obtained and pending and patient placed empirically on IV Rocephin.  ? ? ?Assessment & Plan: ? Principal Problem: ?  Acute hypoxemic respiratory failure due to COVID-19 [facility]) ?Active Problems: ?  Hypoxia ?  UTI (urinary tract infection) ?  ANEMIA ?  Chronic diastolic heart failure (HCC) ?  Hypothyroidism ?  History of CVA (cerebrovascular accident) ?  Tobacco abuse ?  Dementia without behavioral disturbance (HCC) ?  Urinary retention ?  Essential hypertension ?  Hyperlipidemia ?  Protein-calorie malnutrition, severe ?  Anxiety disorder, unspecified ?  Chronic obstructive pulmonary disease, unspecified (HCC) ?  COVID-19 virus infection ? ? ? ?Assessment and Plan: ?* Acute hypoxemic respiratory failure due to COVID-19 [facility]) ?Patient presenting with hypoxia from facility, recent diagnosis of COVID-19 however actual date unknown. ?-COVID-19 PCR done on presentation was positive (08/09/2021 ).p ?-Patient noted in ED to have sats of 84% on room air and placed on 2 L nasal cannula. ?-Inflammatory markers with  elevated D-dimer, elevated CRP.  Ferritin noted at 116.  LDH at 125.   ?-Continue on IV remdesivir, Solu-Medrol 60 mg every 12 hours, Claritin, vitamin C, zinc, Flonase, Robitussin twice daily. ?-Patient was placed on inhalers of Dulera and Combivent however due to dementia it is noted per RT that patient unable to follow commands and as such Combivent and Dulera were discontinued and patient placed on scheduled DuoNebs as well as scheduled Pulmicort. ?-Supportive care. ?-Follow inflammatory markers. ? ?Hypoxia ?- See above. ? ?UTI (urinary tract infection) ?- Patient with chronic indwelling Foley catheter. ?-? CAUTI ?-Urinalysis done concerning for UTI.  Urine cultures pending. ?-Exchanged Foley catheter and repeat UA with cultures and sensitivities placed. ?-Continue IV Rocephin pending urine culture results. ? ?Chronic obstructive pulmonary disease, unspecified (HCC) ?- Stable. ?-Patient unable to follow commands to use inhalers appropriately and as such Dulera and Combivent will be discontinued and patient started on Pulmicort nebs and DuoNebs.   ? ?Anxiety disorder, unspecified ?-Continue home regimen anxiolytics and Zoloft. ? ?Protein-calorie malnutrition, severe ?-Continue Ensure supplementation. ? ?Hyperlipidemia ?-Continue home regimen statin ? ?Essential hypertension ?- BP stable. ?-Not on any antihypertensive medications. ?-Monitor with hydration. ? ?Urinary retention ?- Patient with chronic Foley catheter. ?-Exchanged Foley catheter. ? ?Dementia without behavioral disturbance (HCC) ?-Continue home regimen Abilify, Namenda, Aricept, Remeron. ? ?Tobacco abuse ?- Patient with dementia, tobacco cessation stressed. ? ?History of CVA (cerebrovascular accident) ?-Continue home regimen statin. ?-Not on aspirin prior to admission. ?-Place on low-dose aspirin 81 mg daily ? ?Hypothyroidism ?- Continue home regimen Synthroid. ? ?Chronic diastolic heart failure (HCC) ?- Stable. ?-Monitor closely with  hydration. ? ?ANEMIA ?-  Follow H&H. ?-PPI. ? ? ? ? ?  ? ? ?DVT prophylaxis: Lovenox ?Code Status: DNR ?Family Communication: No family at bedside. ?Disposition: Back to nursing facility ? ?Status is: Inpatient ?Remains inpatient appropriate because: Severity of illness ?  ?Consultants:  ?None ? ?Procedures:  ?CT angiogram chest 08/09/2021 ?Chest x-ray 08/09/2021 ? ?Antimicrobials:  ?IV Rocephin 08/09/2021>>>> ?IV remdesivir 08/09/2021>>>> ? ? ?Subjective: ?Sleeping comfortably in the fetal position.  Arouses easily.  Denies any chest pain.  Denies any shortness of breath ? ?Objective: ?Vitals:  ? 08/10/21 0825 08/10/21 0826 08/10/21 1319 08/10/21 1337  ?BP:    (!) 113/55  ?Pulse:    82  ?Resp:    16  ?Temp:    98 ?F (36.7 ?C)  ?TempSrc:      ?SpO2: 100% 100% 100%   ?Weight:      ?Height:      ? ? ?Intake/Output Summary (Last 24 hours) at 08/10/2021 1401 ?Last data filed at 08/10/2021 1355 ?Gross per 24 hour  ?Intake 1540 ml  ?Output --  ?Net 1540 ml  ? ?Filed Weights  ? 08/09/21 1654 08/10/21 0807  ?Weight: 54.4 kg 37.6 kg  ? ? ?Examination: ? ?General exam: Appears calm and comfortable.  Sleeping. ?Respiratory system: Clear to auscultation.  No wheezes, no crackles, no rhonchi.  Respiratory effort normal. ?Cardiovascular system: S1 & S2 heard, RRR. No JVD, murmurs, rubs, gallops or clicks. No pedal edema. ?Gastrointestinal system: Abdomen is nondistended, soft and nontender. No organomegaly or masses felt. Normal bowel sounds heard. ?Central nervous system: Alert. No focal neurological deficits.  Moving extremities spontaneously ?Extremities: Symmetric 5 x 5 power. ?Skin: No rashes, lesions or ulcers ?Psychiatry: Judgement and insight appear poor. Mood & affect appropriate.  ? ? ? ?Data Reviewed:  ? ?CBC: ?Recent Labs  ?Lab 08/09/21 ?1254 08/10/21 ?2585  ?WBC 8.7 5.9  ?NEUTROABS 5.4 4.9  ?HGB 12.1 11.1*  ?HCT 39.2 37.1  ?MCV 93.1 91.6  ?PLT 311 274  ? ? ?Basic Metabolic Panel: ?Recent Labs  ?Lab 08/09/21 ?1254  08/10/21 ?2778  ?NA 142 140  ?K 3.9 3.5  ?CL 105 107  ?CO2 28 25  ?GLUCOSE 104* 275*  ?BUN 15 19  ?CREATININE 0.65 0.57  ?CALCIUM 8.8* 8.6*  ?MG  --  2.0  ?PHOS  --  2.7  ? ? ?GFR: ?Estimated Creatinine Clearance: 37.7 mL/min (by C-G formula based on SCr of 0.57 mg/dL). ? ?Liver Function Tests: ?Recent Labs  ?Lab 08/09/21 ?1254 08/10/21 ?2423  ?AST 17 19  ?ALT 13 16  ?ALKPHOS 69 62  ?BILITOT 0.2* 0.4  ?PROT 6.6 6.0*  ?ALBUMIN 3.0* 2.5*  ? ? ?CBG: ?Recent Labs  ?Lab 08/09/21 ?1748 08/09/21 ?2252 08/10/21 ?5361 08/10/21 ?1111  ?GLUCAP 94 262* 198* 175*  ? ? ? ?Recent Results (from the past 240 hour(s))  ?Blood Culture (routine x 2)     Status: None (Preliminary result)  ? Collection Time: 08/09/21 12:54 PM  ? Specimen: BLOOD LEFT FOREARM  ?Result Value Ref Range Status  ? Specimen Description   Final  ?  BLOOD LEFT FOREARM BOTTLES DRAWN AEROBIC AND ANAEROBIC  ? Special Requests Blood Culture adequate volume  Final  ? Culture   Final  ?  NO GROWTH < 24 HOURS ?Performed at [facility], [address] [place], [place] [address] ?  ? Report Status PENDING  Incomplete  ?Blood Culture (routine x 2)     Status: None (Preliminary result)  ? Collection Time: 08/09/21 12:54 PM  ?  Specimen: BLOOD RIGHT FOREARM  ?Result Value Ref Range Status  ? Specimen Description   Final  ?  BLOOD RIGHT FOREARM BOTTLES DRAWN AEROBIC AND ANAEROBIC  ? Special Requests Blood Culture adequate volume  Final  ? Culture   Final  ?  NO GROWTH < 24 HOURS ?Performed at [facility], [address] [place], [place] [address] ?  ? Report Status PENDING  Incomplete  ?Urine Culture     Status: None (Preliminary result)  ? Collection Time: 08/09/21 12:54 PM  ? Specimen: Urine, Catheterized  ?Result Value Ref Range Status  ? Specimen Description   Final  ?  URINE, CATHETERIZED ?Performed at [facility], [address] [place], [place] [address] ?  ? Special Requests   Final  ?  NONE ?Performed at [facility], [address] [place], [place] [address] ?  ?  Culture   Final  ?  CULTURE REINCUBATED FOR BETTER GROWTH ?Performed at [facility], [address] [place], [place] [address] ?  ? Report Status PENDING  Incomplete  ?Resp Panel by RT-PCR (Flu A&B, Covid) Nasopharyngea

## 2021-08-10 NOTE — Plan of Care (Signed)
Pt brought from the ED via stretcher with mask on, NS @ 75 running at the time. Pt total care and transferred to the bed by this nurse and 2 NT's. Pt assessed, skin intact, telemetry started, peri care provided, foley catheter care initiated and catheter below the level of the bladder secure with leg strap. Pt resting in bed at this time.  ?

## 2021-08-10 NOTE — Hospital Course (Signed)
Patient 71 year old female history significant for dementia, COPD not on home O2, depression, CHF, HOH, history of urinary retention status post chronic Foley catheter, history of hydroureteronephrosis, hypertension, hyperlipidemia, hypothyroidism, history of CVA presented to the ED from nursing facility for evaluation of hypoxia.  It is noted that patient had recently been diagnosed with COVID however unsure of exact date.  EMS was called to facility as patient was noted to be hypoxic.  Patient subsequently brought to the ED and noted to have sats of 84% on room air.  Patient admitted for acute COVID-19 infection and placed on IV remdesivir, IV Solu-Medrol, nebulizer treatments.  Urinalysis also concerning for UTI and as such urine cultures obtained and pending and patient placed empirically on IV Rocephin. ?

## 2021-08-10 NOTE — ED Notes (Signed)
Report given to receiving nurse on 300.  ?

## 2021-08-10 NOTE — TOC Progression Note (Signed)
Transition of Care (TOC) - Progression Note  ? ? ?Patient Details  ?Name: Kimberly [name] ?MRN: [id number] ?Date of Birth: 1950-07-20 ? ?Transition of Care (TOC) CM/SW Contact  ?Leitha Bleak, RN ?Phone Number: ?08/10/2021, 2:01 PM ? ?Clinical Narrative:   From [facility] admitted with COVID, DC after 5 days of remdesivir. TOC to follow. ? ?  ?Barriers to Discharge: Continued Medical Work up ?  ?         ?

## 2021-08-11 DIAGNOSIS — F419 Anxiety disorder, unspecified: Secondary | ICD-10-CM | POA: Diagnosis not present

## 2021-08-11 DIAGNOSIS — D649 Anemia, unspecified: Secondary | ICD-10-CM | POA: Diagnosis not present

## 2021-08-11 DIAGNOSIS — J9601 Acute respiratory failure with hypoxia: Secondary | ICD-10-CM | POA: Diagnosis not present

## 2021-08-11 DIAGNOSIS — U071 COVID-19: Secondary | ICD-10-CM | POA: Diagnosis not present

## 2021-08-11 LAB — GLUCOSE, CAPILLARY
Glucose-Capillary: 129 mg/dL — ABNORMAL HIGH (ref 70–99)
Glucose-Capillary: 178 mg/dL — ABNORMAL HIGH (ref 70–99)
Glucose-Capillary: 183 mg/dL — ABNORMAL HIGH (ref 70–99)
Glucose-Capillary: 175 mg/dL — ABNORMAL HIGH (ref 70–99)

## 2021-08-11 LAB — D-DIMER, QUANTITATIVE: D-Dimer, Quant: 0.84 ug/mL-FEU — ABNORMAL HIGH (ref 0.00–0.50)

## 2021-08-11 MED ORDER — IPRATROPIUM-ALBUTEROL 0.5-2.5 (3) MG/3ML IN SOLN
3.0000 mL | Freq: Two times a day (BID) | RESPIRATORY_TRACT | Status: DC
  Administered 2021-08-12 – 2021-08-13 (×3): 3 mL via RESPIRATORY_TRACT
  Filled 2021-08-11 (×3): qty 3

## 2021-08-11 MED ORDER — SODIUM CHLORIDE 0.9 % IV SOLN
100.0000 mg | Freq: Every day | INTRAVENOUS | Status: AC
  Administered 2021-08-12 – 2021-08-13 (×2): 100 mg via INTRAVENOUS
  Filled 2021-08-11 (×2): qty 20

## 2021-08-11 NOTE — Assessment & Plan Note (Signed)
See above

## 2021-08-11 NOTE — Progress Notes (Signed)
?PROGRESS NOTE ? ? ? ?Kimberly [name]  WUJ:[id number] DOB: 10-26-1950 DOA: 08/09/2021 ?PCP: Pcp, No  ? ? ?Chief Complaint  ?Patient presents with  ? hypoxic  ? ? ?Brief Narrative:  ?Patient 71 year old female history significant for dementia, COPD not on home O2, depression, CHF, HOH, history of urinary retention status post chronic Foley catheter, history of hydroureteronephrosis, hypertension, hyperlipidemia, hypothyroidism, history of CVA presented to the ED from nursing facility for evaluation of hypoxia.  It is noted that patient had recently been diagnosed with COVID however unsure of exact date.  EMS was called to facility as patient was noted to be hypoxic.  Patient subsequently brought to the ED and noted to have sats of 84% on room air.  Patient admitted for acute COVID-19 infection and placed on IV remdesivir, IV Solu-Medrol, nebulizer treatments.  Urinalysis also concerning for UTI and as such urine cultures obtained and pending and patient placed empirically on IV Rocephin.  ? ? ?Assessment & Plan: ? Principal Problem: ?  Acute hypoxemic respiratory failure due to COVID-19 [facility]) ?Active Problems: ?  Hypoxia ?  UTI (urinary tract infection) ?  ANEMIA ?  Chronic diastolic heart failure (HCC) ?  Hypothyroidism ?  History of CVA (cerebrovascular accident) ?  Tobacco abuse ?  Dementia without behavioral disturbance (HCC) ?  Urinary retention ?  Essential hypertension ?  Hyperlipidemia ?  Protein-calorie malnutrition, severe ?  Anxiety disorder, unspecified ?  Chronic obstructive pulmonary disease, unspecified (HCC) ?  COVID-19 virus infection ? ? ? ?Assessment and Plan: ?* Acute hypoxemic respiratory failure due to COVID-19 [facility]) ?Patient presented with hypoxia from facility, recent diagnosis of COVID-19 however actual date unknown. ?-COVID-19 PCR done on presentation was positive (08/09/2021 ).p ?-Patient noted in ED to have sats of 84% on room air and placed on 2 L nasal cannula. ?-Inflammatory markers with  elevated D-dimer, elevated CRP.  Ferritin noted at 116.  LDH at 125.   ?-Continue on IV remdesivir, Solu-Medrol 60 mg every 12 hours with oral prednisone taper, Claritin, vitamin C, zinc, Flonase, Robitussin twice daily. ?-Patient was placed on inhalers of Dulera and Combivent however due to dementia it is noted per RT that patient unable to follow commands and as such Combivent and Dulera were discontinued and patient placed on scheduled DuoNebs as well as scheduled Pulmicort.  ?-Wean oxygen. ?-Supportive care. ?-Follow inflammatory markers. ? ?Hypoxia ?- See above. ? ?UTI (urinary tract infection) ?- Patient with chronic indwelling Foley catheter. ?-? CAUTI ?-Urinalysis done concerning for UTI.  Urine cultures with multiple species. ?-Exchanged Foley catheter and repeat UA with cultures and sensitivities placed. ?-Repeat urine cultures which are ordered with UA after Foley exchange was somehow canceled. ?-Continue IV Rocephin and treat empirically for 3 days. ? ?COVID-19 virus infection ?- See above. ? ?Chronic obstructive pulmonary disease, unspecified (HCC) ?- Stable. ?-Patient unable to follow commands to use inhalers appropriately and as such Dulera and Combivent will be discontinued and patient started on Pulmicort nebs and DuoNebs.   ? ?Anxiety disorder, unspecified ?-Continue home regimen anxiolytics and Zoloft. ? ?Protein-calorie malnutrition, severe ?-Continue Ensure supplementation. ? ?Hyperlipidemia ?-Continue home regimen statin ? ?Essential hypertension ?- BP stable. ?-Not on any antihypertensive medications. ?-Monitor with hydration. ? ?Urinary retention ?- Patient with chronic Foley catheter. ?-Exchanged Foley catheter. ? ?Dementia without behavioral disturbance (HCC) ?-Continue home regimen Abilify, Namenda, Aricept, Remeron. ?-We will likely need to go back to memory care unit on discharge. ? ?Tobacco abuse ?- Patient with dementia, tobacco cessation stressed. ? ?  History of CVA (cerebrovascular  accident) ?-Continue home regimen statin. ?-Not on aspirin prior to admission. ?-Continue low-dose aspirin 81 mg daily. ?-Continue statin. ?-PT/OT. ? ?Hypothyroidism ?- Continue home regimen Synthroid. ?-Outpatient follow-up with [facility] in 4 to 6 weeks. ? ?Chronic diastolic heart failure (HCC) ?- Stable. ?-Monitor closely with hydration. ? ?ANEMIA ?- Follow H&H. ?-Hemoglobin 11.1 ?-PPI. ? ? ? ? ?  ? ? ?DVT prophylaxis: Lovenox ?Code Status: DNR ?Family Communication: Updated daughter Brentlee Sciara on the phone. ?Disposition: Back to nursing facility once completed IV remdesivir. ? ?Status is: Inpatient ?Remains inpatient appropriate because: Severity of illness ?  ?Consultants:  ?None ? ?Procedures:  ?CT angiogram chest 08/09/2021 ?Chest x-ray 08/09/2021 ? ?Antimicrobials:  ?IV Rocephin 08/09/2021>>>> ?IV remdesivir 08/09/2021>>>> ? ? ?Subjective: ?Are alert.  Pleasantly confused.  Answers yes to most questions tolerated dysphagia 3 diet per RN today.  ? ?Objective: ?Vitals:  ? 08/10/21 2022 08/10/21 2027 08/11/21 0542 08/11/21 1401  ?BP: 120/67  105/60 109/60  ?Pulse: 94  60 78  ?Resp:   18 16  ?Temp: 98.3 ?F (36.8 ?C)  97.8 ?F (36.6 ?C) 98 ?F (36.7 ?C)  ?TempSrc: Oral   Oral  ?SpO2: 100% 98% 99% 99%  ?Weight:      ?Height:      ? ? ?Intake/Output Summary (Last 24 hours) at 08/11/2021 1604 ?Last data filed at 08/11/2021 1321 ?Gross per 24 hour  ?Intake 1320 ml  ?Output 950 ml  ?Net 370 ml  ? ?Filed Weights  ? 08/09/21 1654 08/10/21 0807  ?Weight: 54.4 kg 37.6 kg  ? ? ?Examination: ? ?General exam: Appears calm and comfortable. ?Respiratory system: CTA B.  No wheezes, no crackles, no rhonchi.  Normal respiratory effort.  Speaking in full sentences. ?Cardiovascular system: Regular rate rhythm no murmurs rubs or gallops.  No JVD.  No lower extremity edema. ?Gastrointestinal system: Abdomen is nondistended, soft and nontender. No organomegaly or masses felt. Normal bowel sounds heard. ?Central nervous system: Alert. No focal  neurological deficits.  Moving extremities spontaneously ?Extremities: Symmetric 5 x 5 power. ?Skin: No rashes, lesions or ulcers ?Psychiatry: Judgement and insight appear poor. Mood & affect appropriate.  ? ? ? ?Data Reviewed:  ? ?CBC: ?Recent Labs  ?Lab 08/09/21 ?1254 08/10/21 ?5573  ?WBC 8.7 5.9  ?NEUTROABS 5.4 4.9  ?HGB 12.1 11.1*  ?HCT 39.2 37.1  ?MCV 93.1 91.6  ?PLT 311 274  ? ? ?Basic Metabolic Panel: ?Recent Labs  ?Lab 08/09/21 ?1254 08/10/21 ?2202  ?NA 142 140  ?K 3.9 3.5  ?CL 105 107  ?CO2 28 25  ?GLUCOSE 104* 275*  ?BUN 15 19  ?CREATININE 0.65 0.57  ?CALCIUM 8.8* 8.6*  ?MG  --  2.0  ?PHOS  --  2.7  ? ? ?GFR: ?Estimated Creatinine Clearance: 37.7 mL/min (by C-G formula based on SCr of 0.57 mg/dL). ? ?Liver Function Tests: ?Recent Labs  ?Lab 08/09/21 ?1254 08/10/21 ?5427  ?AST 17 19  ?ALT 13 16  ?ALKPHOS 69 62  ?BILITOT 0.2* 0.4  ?PROT 6.6 6.0*  ?ALBUMIN 3.0* 2.5*  ? ? ?CBG: ?Recent Labs  ?Lab 08/10/21 ?1111 08/10/21 ?1615 08/10/21 ?2110 08/11/21 ?0720 08/11/21 ?1103  ?GLUCAP 175* 151* 221* 129* 178*  ? ? ? ?Recent Results (from the past 240 hour(s))  ?Blood Culture (routine x 2)     Status: None (Preliminary result)  ? Collection Time: 08/09/21 12:54 PM  ? Specimen: BLOOD LEFT FOREARM  ?Result Value Ref Range Status  ? Specimen Description   Final  ?  BLOOD LEFT FOREARM BOTTLES DRAWN AEROBIC AND ANAEROBIC  ? Special Requests Blood Culture adequate volume  Final  ? Culture   Final  ?  NO GROWTH 2 DAYS ?Performed at [facility], [address] [place], [place] [address] ?  ? Report Status PENDING  Incomplete  ?Blood Culture (routine x 2)     Status: None (Preliminary result)  ? Collection Time: 08/09/21 12:54 PM  ? Specimen: BLOOD RIGHT FOREARM  ?Result Value Ref Range Status  ? Specimen Description   Final  ?  BLOOD RIGHT FOREARM BOTTLES DRAWN AEROBIC AND ANAEROBIC  ? Special Requests Blood Culture adequate volume  Final  ? Culture   Final  ?  NO GROWTH 2 DAYS ?Performed at [facility], [address] [place], [place] [address] ?  ? Report Status PENDING  Incomplete  ?Urine Culture     Status: Abnormal  ? Collection Time: 08/09/21 12:54 PM  ? Specimen: Urine, Catheterized  ?Result Value Ref Range Status

## 2021-08-12 DIAGNOSIS — J9601 Acute respiratory failure with hypoxia: Secondary | ICD-10-CM | POA: Diagnosis not present

## 2021-08-12 DIAGNOSIS — D649 Anemia, unspecified: Secondary | ICD-10-CM | POA: Diagnosis not present

## 2021-08-12 DIAGNOSIS — F419 Anxiety disorder, unspecified: Secondary | ICD-10-CM | POA: Diagnosis not present

## 2021-08-12 DIAGNOSIS — U071 COVID-19: Secondary | ICD-10-CM | POA: Diagnosis not present

## 2021-08-12 LAB — CBC WITH DIFFERENTIAL/PLATELET
Abs Immature Granulocytes: 0.14 10*3/uL — ABNORMAL HIGH (ref 0.00–0.07)
Basophils Absolute: 0 10*3/uL (ref 0.0–0.1)
Basophils Relative: 0 %
Eosinophils Absolute: 0 10*3/uL (ref 0.0–0.5)
Eosinophils Relative: 0 %
HCT: 35 % — ABNORMAL LOW (ref 36.0–46.0)
Hemoglobin: 11 g/dL — ABNORMAL LOW (ref 12.0–15.0)
Immature Granulocytes: 1 %
Lymphocytes Relative: 5 %
Lymphs Abs: 0.5 10*3/uL — ABNORMAL LOW (ref 0.7–4.0)
MCH: 28.6 pg (ref 26.0–34.0)
MCHC: 31.4 g/dL (ref 30.0–36.0)
MCV: 90.9 fL (ref 80.0–100.0)
Monocytes Absolute: 0.1 10*3/uL (ref 0.1–1.0)
Monocytes Relative: 1 %
Neutro Abs: 9.2 10*3/uL — ABNORMAL HIGH (ref 1.7–7.7)
Neutrophils Relative %: 93 %
Platelets: 362 10*3/uL (ref 150–400)
RBC: 3.85 MIL/uL — ABNORMAL LOW (ref 3.87–5.11)
RDW: 14 % (ref 11.5–15.5)
WBC: 10 10*3/uL (ref 4.0–10.5)
nRBC: 0 % (ref 0.0–0.2)

## 2021-08-12 LAB — COMPREHENSIVE METABOLIC PANEL
ALT: 64 U/L — ABNORMAL HIGH (ref 0–44)
AST: 46 U/L — ABNORMAL HIGH (ref 15–41)
Albumin: 2.6 g/dL — ABNORMAL LOW (ref 3.5–5.0)
Alkaline Phosphatase: 65 U/L (ref 38–126)
Anion gap: 8 (ref 5–15)
BUN: 16 mg/dL (ref 8–23)
CO2: 22 mmol/L (ref 22–32)
Calcium: 8 mg/dL — ABNORMAL LOW (ref 8.9–10.3)
Chloride: 106 mmol/L (ref 98–111)
Creatinine, Ser: 0.65 mg/dL (ref 0.44–1.00)
GFR, Estimated: >60 mL/min (ref >60)
Glucose, Bld: 227 mg/dL — ABNORMAL HIGH (ref 70–99)
Potassium: 4 mmol/L (ref 3.5–5.1)
Sodium: 136 mmol/L (ref 135–145)
Total Bilirubin: 0.1 mg/dL — ABNORMAL LOW (ref 0.3–1.2)
Total Protein: 5.6 g/dL — ABNORMAL LOW (ref 6.5–8.1)

## 2021-08-12 LAB — GLUCOSE, CAPILLARY
Glucose-Capillary: 91 mg/dL (ref 70–99)
Glucose-Capillary: 117 mg/dL — ABNORMAL HIGH (ref 70–99)
Glucose-Capillary: 176 mg/dL — ABNORMAL HIGH (ref 70–99)
Glucose-Capillary: 219 mg/dL — ABNORMAL HIGH (ref 70–99)

## 2021-08-12 LAB — D-DIMER, QUANTITATIVE: D-Dimer, Quant: 1.4 ug/mL-FEU — ABNORMAL HIGH (ref 0.00–0.50)

## 2021-08-12 LAB — MAGNESIUM: Magnesium: 1.8 mg/dL (ref 1.7–2.4)

## 2021-08-12 LAB — C-REACTIVE PROTEIN: CRP: 1.5 mg/dL — ABNORMAL HIGH (ref <1.0)

## 2021-08-12 LAB — FERRITIN: Ferritin: 158 ng/mL (ref 11–307)

## 2021-08-12 LAB — PHOSPHORUS: Phosphorus: 1.7 mg/dL — ABNORMAL LOW (ref 2.5–4.6)

## 2021-08-12 MED ORDER — ENOXAPARIN SODIUM 30 MG/0.3ML IJ SOSY
30.0000 mg | PREFILLED_SYRINGE | INTRAMUSCULAR | Status: DC
  Administered 2021-08-12: 30 mg via SUBCUTANEOUS
  Filled 2021-08-12: qty 0.3

## 2021-08-12 MED ORDER — SODIUM CHLORIDE 0.9% FLUSH
10.0000 mL | Freq: Two times a day (BID) | INTRAVENOUS | Status: DC
  Administered 2021-08-12 – 2021-08-13 (×3): 10 mL

## 2021-08-12 MED ORDER — SODIUM CHLORIDE 0.9% FLUSH: 10.0000 mL | INTRAVENOUS | Status: DC | PRN

## 2021-08-12 NOTE — Progress Notes (Signed)
?PROGRESS NOTE ? ? ? ?Kimberly [name]  DQQ:[id number] DOB: 1951/02/20 DOA: 08/09/2021 ?PCP: Pcp, No  ? ? ?Chief Complaint  ?Patient presents with  ? hypoxic  ? ? ?Brief Narrative:  ?Patient 71 year old female history significant for dementia, COPD not on home O2, depression, CHF, HOH, history of urinary retention status post chronic Foley catheter, history of hydroureteronephrosis, hypertension, hyperlipidemia, hypothyroidism, history of CVA presented to the ED from nursing facility for evaluation of hypoxia.  It is noted that patient had recently been diagnosed with COVID however unsure of exact date.  EMS was called to facility as patient was noted to be hypoxic.  Patient subsequently brought to the ED and noted to have sats of 84% on room air.  Patient admitted for acute COVID-19 infection and placed on IV remdesivir, IV Solu-Medrol, nebulizer treatments.  Urinalysis also concerning for UTI and as such urine cultures obtained and pending and patient placed empirically on IV Rocephin.  ? ? ?Assessment & Plan: ? Principal Problem: ?  Acute hypoxemic respiratory failure due to COVID-19 [facility]) ?Active Problems: ?  Hypoxia ?  UTI (urinary tract infection) ?  ANEMIA ?  Chronic diastolic heart failure (HCC) ?  Hypothyroidism ?  History of CVA (cerebrovascular accident) ?  Tobacco abuse ?  Dementia without behavioral disturbance (HCC) ?  Urinary retention ?  Essential hypertension ?  Hyperlipidemia ?  Protein-calorie malnutrition, severe ?  Anxiety disorder, unspecified ?  Chronic obstructive pulmonary disease, unspecified (HCC) ?  COVID-19 virus infection ? ? ? ?Assessment and Plan: ?* Acute hypoxemic respiratory failure due to COVID-19 [facility]) ?Patient presented with hypoxia from facility, recent diagnosis of COVID-19 however actual date unknown. ?-COVID-19 PCR done on presentation was positive (08/09/2021 ).p ?-Patient noted in ED to have sats of 84% on room air and placed on 2 L nasal cannula. ?-Inflammatory markers with  elevated D-dimer, elevated CRP.  Ferritin noted at 116.  LDH at 125.   ?-Labs pending. ?-Continue on IV remdesivir completed 5-day course, Solu-Medrol 60 mg every 12 hours with oral prednisone taper, Claritin, vitamin C, zinc, Flonase, Robitussin twice daily. ?-Patient was placed on inhalers of Dulera and Combivent however due to dementia it is noted per RT that patient unable to follow commands and as such Combivent and Dulera were discontinued and patient placed on scheduled DuoNebs as well as scheduled Pulmicort.  ?-Wean oxygen. ?-Supportive care. ?-Follow inflammatory markers. ? ?Hypoxia ?- See above. ? ?UTI (urinary tract infection) ?- Patient with chronic indwelling Foley catheter. ?-? CAUTI ?-Urinalysis done concerning for UTI.  Urine cultures with multiple species. ?-Exchanged Foley catheter and repeat UA with cultures and sensitivities placed. ?-Repeat urine cultures which are ordered with UA after Foley exchange was somehow canceled. ?-Continue IV Rocephin through today and discontinue after today's dose. ? ?COVID-19 virus infection ?- See above. ? ?Chronic obstructive pulmonary disease, unspecified (HCC) ?- Stable. ?-Patient unable to follow commands to use inhalers appropriately and as such Dulera and Combivent were discontinued and patient started on Pulmicort nebs and DuoNebs.   ? ?Anxiety disorder, unspecified ?-Continue home regimen anxiolytics and Zoloft. ? ?Protein-calorie malnutrition, severe ?-Continue Ensure supplementation. ? ?Hyperlipidemia ?-Continue home regimen statin ? ?Essential hypertension ?- BP stable. ?-Not on any antihypertensive medications. ?-Monitor with hydration. ? ?Urinary retention ?- Patient with chronic Foley catheter. ?-Exchanged Foley catheter. ? ?Dementia without behavioral disturbance (HCC) ?-Continue home regimen Abilify, Namenda, Aricept, Remeron. ?-We will likely need to go back to memory care unit on discharge. ? ?Tobacco abuse ?- Patient  with dementia, tobacco  cessation stressed. ? ?History of CVA (cerebrovascular accident) ?-Continue home regimen statin. ?-Not on aspirin prior to admission. ?-Continue low-dose aspirin 81 mg daily. ?-PT/OT/SLP. ? ?Hypothyroidism ?- Continue home regimen Synthroid. ?-Outpatient follow-up with [facility] in 4 to 6 weeks. ? ?Chronic diastolic heart failure (HCC) ?- Stable. ?-Monitor closely with hydration. ? ?ANEMIA ?- Follow H&H. ?-Labs pending. ?-PPI. ? ? ? ? ?  ? ? ?DVT prophylaxis: Lovenox ?Code Status: DNR ?Family Communication: No family at bedside. ?Disposition: Back to nursing facility once completed IV remdesivir hopefully 1 to 2 days. ? ?Status is: Inpatient ?Remains inpatient appropriate because: Severity of illness ?  ?Consultants:  ?None ? ?Procedures:  ?CT angiogram chest 08/09/2021 ?Chest x-ray 08/09/2021 ?Midline 08/12/2021 ? ?Antimicrobials:  ?IV Rocephin 08/09/2021>>>> 08/12/2021 ?IV remdesivir 08/09/2021>>>> 08/14/2021 ? ? ?Subjective: ?Laying in bed.  Alert.  Denies any chest pain.  No shortness of breath.  No abdominal pain.  Thankful for her care.  ? ?Objective: ?Vitals:  ? 08/11/21 2116 08/12/21 0542 08/12/21 0826 08/12/21 1152  ?BP: (!) 115/59 127/65  116/64  ?Pulse: 64 (!) 59  70  ?Resp: 16 16  17   ?Temp:  (!) 97.5 ?F (36.4 ?C)  98.2 ?F (36.8 ?C)  ?TempSrc:  Oral    ?SpO2: 96% 97% 96% 96%  ?Weight:      ?Height:      ? ? ?Intake/Output Summary (Last 24 hours) at 08/12/2021 1347 ?Last data filed at 08/12/2021 0900 ?Gross per 24 hour  ?Intake 480 ml  ?Output 950 ml  ?Net -470 ml  ? ?Filed Weights  ? 08/09/21 1654 08/10/21 0807  ?Weight: 54.4 kg 37.6 kg  ? ? ?Examination: ? ?General exam: Alert. ?Respiratory system: Lungs clear to auscultation bilaterally anterior lung fields.  No wheezes, no crackles, no rhonchi.  Normal respiratory effort.  Speaking in full sentences. ?Cardiovascular system: RRR no murmurs rubs or gallops.  No JVD.  No lower extremity edema.   ?Gastrointestinal system: Abdomen is nondistended, soft and nontender. No  organomegaly or masses felt. Normal bowel sounds heard. ?Central nervous system: Alert. No focal neurological deficits.  Moving extremities spontaneously ?Extremities: Symmetric 5 x 5 power. ?Skin: No rashes, lesions or ulcers ?Psychiatry: Judgement and insight appear poor. Mood & affect appropriate.  ? ? ? ?Data Reviewed:  ? ?CBC: ?Recent Labs  ?Lab 08/09/21 ?1254 08/10/21 ?08/12/21  ?WBC 8.7 5.9  ?NEUTROABS 5.4 4.9  ?HGB 12.1 11.1*  ?HCT 39.2 37.1  ?MCV 93.1 91.6  ?PLT 311 274  ? ? ?Basic Metabolic Panel: ?Recent Labs  ?Lab 08/09/21 ?1254 08/10/21 ?08/12/21  ?NA 142 140  ?K 3.9 3.5  ?CL 105 107  ?CO2 28 25  ?GLUCOSE 104* 275*  ?BUN 15 19  ?CREATININE 0.65 0.57  ?CALCIUM 8.8* 8.6*  ?MG  --  2.0  ?PHOS  --  2.7  ? ? ?GFR: ?Estimated Creatinine Clearance: 37.7 mL/min (by C-G formula based on SCr of 0.57 mg/dL). ? ?Liver Function Tests: ?Recent Labs  ?Lab 08/09/21 ?1254 08/10/21 ?08/12/21  ?AST 17 19  ?ALT 13 16  ?ALKPHOS 69 62  ?BILITOT 0.2* 0.4  ?PROT 6.6 6.0*  ?ALBUMIN 3.0* 2.5*  ? ? ?CBG: ?Recent Labs  ?Lab 08/11/21 ?1103 08/11/21 ?1712 08/11/21 ?2117 08/12/21 ?0710 08/12/21 ?1104  ?GLUCAP 178* 183* 175* 91 117*  ? ? ? ?Recent Results (from the past 240 hour(s))  ?Blood Culture (routine x 2)     Status: None (Preliminary result)  ? Collection Time: 08/09/21 12:54 PM  ?  Specimen: BLOOD LEFT FOREARM  ?Result Value Ref Range Status  ? Specimen Description   Final  ?  BLOOD LEFT FOREARM BOTTLES DRAWN AEROBIC AND ANAEROBIC  ? Special Requests Blood Culture adequate volume  Final  ? Culture   Final  ?  NO GROWTH 2 DAYS ?Performed at [facility], [address] [place], [place] [address] ?  ? Report Status PENDING  Incomplete  ?Blood Culture (routine x 2)     Status: None (Preliminary result)  ? Collection Time: 08/09/21 12:54 PM  ? Specimen: BLOOD RIGHT FOREARM  ?Result Value Ref Range Status  ? Specimen Description   Final  ?  BLOOD RIGHT FOREARM BOTTLES DRAWN AEROBIC AND ANAEROBIC  ? Special Requests Blood Culture adequate  volume  Final  ? Culture   Final  ?  NO GROWTH 2 DAYS ?Performed at [facility], [address] [place], [place] [address] ?  ? Report Status PENDING  Incomplete  ?Urine Culture     Status: Abnormal  ? Collecti

## 2021-08-12 NOTE — Evaluation (Signed)
Physical Therapy Evaluation ?Patient Details ?Name: Kimberly [name] ?MRN: [id number] ?DOB: 10-29-1950 ?Today's Date: 08/12/2021 ? ?History of Present Illness ? Kimberly [name] is a 71 y.o. female with medical history significant of significant dementia, COPD not on home O2, depression, CHF, hard of hearing, history of urinary retention status post chronic Foley catheter, history of hydroureteronephrosis, hypertension, hyperlipidemia, hypothyroidism, history of CVA presenting to the ED from skilled nursing facility for evaluation of hypoxia.  Patient with severe dementia and as such history obtained by ED physician and per notes on epic.  It is noted that patient was recently diagnosed with COVID (with patient endorses) and noted to be hypoxic and as such EMS was called.  It was noted per ED physician note that when EMS got there patient had sats of 91% on room air placed on 2 L nasal cannula.  On presentation patient denies any fevers, no chills, no shortness of breath, no chest pain, no abdominal pain.  Patient does endorse some dysuria although she has a chronic Foley catheter.  Patient noted to have a cough on gurney.  Due to patient dementia history from patient unreliable.  Per ED physician concerned there may have been a COVID breakout at facility as this was the second patient that had presented to the ED COVID-positive with hypoxia.  Unknown exactly when COVID was first diagnosed. ?  ?Clinical Impression ? Patient demonstrates fair/good return for sitting up at bedside, unsteady on feet having to use RW due to fall risk, requires repeated verbal/tactile cueing to follow directions and limited for gait training mostly due to fatigue and poor standing balance.  Patient tolerated sitting up in chair after therapy - NT aware.  Patient will benefit from continued skilled physical therapy in hospital and recommended venue below to increase strength, balance, endurance for safe ADLs and gait.  ?   ?   ? ?Recommendations  for follow up therapy are one component of a multi-disciplinary discharge planning process, led by the attending physician.  Recommendations may be updated based on patient status, additional functional criteria and insurance authorization. ? ?Follow Up Recommendations Skilled nursing-short term rehab (<3 hours/day) ? ?  ?Assistance Recommended at Discharge Intermittent Supervision/Assistance  ?Patient can return home with the following ? A little help with bathing/dressing/bathroom;A lot of help with walking and/or transfers;Help with stairs or ramp for entrance;Assistance with cooking/housework ? ?  ?Equipment Recommendations None recommended by PT  ?Recommendations for Other Services ?    ?  ?Functional Status Assessment Patient has had a recent decline in their functional status and demonstrates the ability to make significant improvements in function in a reasonable and predictable amount of time.  ? ?  ?Precautions / Restrictions Precautions ?Precautions: Fall ?Restrictions ?Weight Bearing Restrictions: No  ? ?  ? ?Mobility ? Bed Mobility ?Overal bed mobility: Needs Assistance ?Bed Mobility: Supine to Sit ?  ?  ?Supine to sit: Min assist, Mod assist ?  ?  ?General bed mobility comments: increased time, labored movement ?  ? ?Transfers ?Overall transfer level: Needs assistance ?Equipment used: Rolling walker (2 wheels) ?Transfers: Sit to/from Stand, Bed to chair/wheelchair/BSC ?Sit to Stand: Min assist ?  ?Step pivot transfers: Min assist, Mod assist ?  ?  ?  ?General transfer comment: labored movement, increased time, frequent verbal/tactile cueing to follow instructions ?  ? ?Ambulation/Gait ?Ambulation/Gait assistance: Mod assist ?Gait Distance (Feet): 12 Feet ?Assistive device: Rolling walker (2 wheels) ?Gait Pattern/deviations: Decreased step length - right, Decreased step length -  left, Decreased stride length ?Gait velocity: decreased ?  ?  ?General Gait Details: limited to a few steps at bedside with  diffiuclty making turns and requires repeated verbal/tactile cueing to follow instructions ? ?Stairs ?  ?  ?  ?  ?  ? ?Wheelchair Mobility ?  ? ?Modified Rankin (Stroke Patients Only) ?  ? ?  ? ?Balance Overall balance assessment: Needs assistance ?Sitting-balance support: Feet supported, No upper extremity supported ?Sitting balance-Leahy Scale: Fair ?Sitting balance - Comments: fair/good seated at EOB ?  ?Standing balance support: During functional activity, No upper extremity supported ?Standing balance-Leahy Scale: Poor ?Standing balance comment: fair/poor using RW ?  ?  ?  ?  ?  ?  ?  ?  ?  ?  ?  ?   ? ? ? ?Pertinent Vitals/Pain Pain Assessment ?Pain Assessment: No/denies pain  ? ? ?Home Living Family/patient expects to be discharged to:: Skilled nursing facility ?  ?  ?  ?  ?  ?  ?  ?  ?  ?   ?  ?Prior Function Prior Level of Function : Needs assist ?  ?  ?  ?Physical Assist : Mobility (physical);ADLs (physical) ?Mobility (physical): Transfers;Bed mobility;Gait;Stairs ?  ?Mobility Comments: Assisted household ambulator using RW ?ADLs Comments: assisted by SNF staff ?  ? ? ?Hand Dominance  ? Dominant Hand: Right ? ?  ?Extremity/Trunk Assessment  ? Upper Extremity Assessment ?Upper Extremity Assessment: Defer to OT evaluation ?  ? ?Lower Extremity Assessment ?Lower Extremity Assessment: Generalized weakness ?  ? ?Cervical / Trunk Assessment ?Cervical / Trunk Assessment: Normal  ?Communication  ? Communication: HOH  ?Cognition Arousal/Alertness: Awake/alert ?Behavior During Therapy: Anxious ?Overall Cognitive Status: History of cognitive impairments - at baseline ?  ?  ?  ?  ?  ?  ?  ?  ?  ?  ?  ?  ?  ?  ?  ?  ?  ?  ?  ? ?  ?General Comments   ? ?  ?Exercises    ? ?Assessment/Plan  ?  ?PT Assessment Patient needs continued PT services  ?PT Problem List Decreased strength;Decreased activity tolerance;Decreased balance;Decreased mobility ? ?   ?  ?PT Treatment Interventions DME instruction;Gait training;Stair  training;Functional mobility training;Therapeutic activities;Therapeutic exercise;Patient/family education;Balance training   ? ?PT Goals (Current goals can be found in the Care Plan section)  ?Acute Rehab PT Goals ?Patient Stated Goal: return home ?PT Goal Formulation: With patient ?Time For Goal Achievement: 08/19/21 ?Potential to Achieve Goals: Good ? ?  ?Frequency Min 3X/week ?  ? ? ?Co-evaluation   ?  ?  ?  ?  ? ? ?  ?AM-PAC PT "6 Clicks" Mobility  ?Outcome Measure Help needed turning from your back to your side while in a flat bed without using bedrails?: None ?Help needed moving from lying on your back to sitting on the side of a flat bed without using bedrails?: A Little ?Help needed moving to and from a bed to a chair (including a wheelchair)?: A Little ?Help needed standing up from a chair using your arms (e.g., wheelchair or bedside chair)?: A Little ?Help needed to walk in hospital room?: A Lot ?Help needed climbing 3-5 steps with a railing? : A Lot ?6 Click Score: 17 ? ?  ?End of Session   ?Activity Tolerance: Patient tolerated treatment well;Patient limited by fatigue ?Patient left: in chair;with call bell/phone within reach;with chair alarm set ?Nurse Communication: Mobility status ?PT Visit Diagnosis: Unsteadiness on  feet (R26.81);Other abnormalities of gait and mobility (R26.89);Muscle weakness (generalized) (M62.81) ?  ? ?Time: 1130-1157 ?PT Time Calculation (min) (ACUTE ONLY): 27 min ? ? ?Charges:   PT Evaluation ?$PT Eval Moderate Complexity: 1 Mod ?PT Treatments ?$Therapeutic Activity: 23-37 mins ?  ?   ? ? ?3:18 PM, 08/12/21 ?Lonell Grandchild, MPT ?Physical Therapist with [place] ?[facility] ?[phone] office ?[phone] mobile phone ? ? ?

## 2021-08-12 NOTE — Plan of Care (Signed)
?  Problem: Acute Rehab PT Goals(only PT should resolve) ?Goal: Pt Will Go Supine/Side To Sit ?Outcome: Progressing ?Flowsheets (Taken 08/12/2021 1519) ?Pt will go Supine/Side to Sit: ? with supervision ? with min guard assist ?Goal: Patient Will Transfer Sit To/From Stand ?Outcome: Progressing ?Flowsheets (Taken 08/12/2021 1519) ?Patient will transfer sit to/from stand: with min guard assist ?Goal: Pt Will Transfer Bed To Chair/Chair To Bed ?Outcome: Progressing ?Flowsheets (Taken 08/12/2021 1519) ?Pt will Transfer Bed to Chair/Chair to Bed: ? min guard assist ? with min assist ?Goal: Pt Will Ambulate ?Outcome: Progressing ?Flowsheets (Taken 08/12/2021 1519) ?Pt will Ambulate: ? 50 feet ? with minimal assist ? with rolling walker ?  ?3:20 PM, 08/12/21 ?Ocie Bob, MPT ?Physical Therapist with [place] ?[facility] ?[phone] office ?[phone] mobile phone ? ?

## 2021-08-12 NOTE — Evaluation (Signed)
Clinical/Bedside Swallow Evaluation ?Patient Details  ?Name: Kimberly [name] ?MRN: [id number] ?Date of Birth: 08-08-1950 ? ?Today's Date: 08/12/2021 ?Time: SLP Start Time (ACUTE ONLY): 1402 SLP Stop Time (ACUTE ONLY): 1431 ?SLP Time Calculation (min) (ACUTE ONLY): 29 min ? ?Past Medical History:  ?Past Medical History:  ?Diagnosis Date  ? Abnormality of gait 08/02/2013  ? Anemia   ? Arthritis   ? osteoarthritis  ? Blind   ? CHF (congestive heart failure) (HCC)   ? COPD (chronic obstructive pulmonary disease) (HCC)   ? DDD (degenerative disc disease)   ? Depression   ? Dysarthria   ? HOH (hard of hearing)   ? Hydroureteronephrosis   ? Hyperlipidemia   ? Hyperlipidemia   ? Hypertension   ? Hypothyroidism   ? Memory disorder 08/02/2013  ? MRSA bacteremia   ? Pneumonia   ? Protein calorie malnutrition (HCC)   ? Renal disorder   ? Right hemiparesis (HCC)   ? Sepsis (HCC)   ? Sepsis (HCC)   ? Shortness of breath   ? Stroke [facility])   ? Tobacco abuse 11/02/2013  ? Urinary retention   ? ?Past Surgical History:  ?Past Surgical History:  ?Procedure Laterality Date  ? ARM DEBRIDEMENT    ? as child  ? CATARACT EXTRACTION W/PHACO  04/28/2011  ? Procedure: CATARACT EXTRACTION PHACO AND INTRAOCULAR LENS PLACEMENT (IOC);  Surgeon: Susa Simmonds;  Location: [facility] ORS;  Service: Ophthalmology;  Laterality: Left;  CDE: 5.37  ? CESAREAN SECTION    ? COLONOSCOPY  06/05/2009  ? RMR: nl rectum, left-sided diverticula, colonoscopy performed due to question of colovesicular fistula   ? STRABISMUS SURGERY    ? age 46  ? TEE WITHOUT CARDIOVERSION N/A 01/02/2017  ? Procedure: TRANSESOPHAGEAL ECHOCARDIOGRAM (TEE);  Surgeon: Antoine Poche, MD;  Location: [facility] ENDO SUITE;  Service: Endoscopy;  Laterality: N/A;  ? ?HPI:  ?Patient 71 year old female history significant for dementia, COPD not on home O2, depression, CHF, HOH, history of urinary retention status post chronic Foley catheter, history of hydroureteronephrosis, hypertension, hyperlipidemia,  hypothyroidism, history of CVA presented to the ED from nursing facility for evaluation of hypoxia.  It is noted that patient had recently been diagnosed with COVID however unsure of exact date.  EMS was called to facility as patient was noted to be hypoxic.  Patient subsequently brought to the ED and noted to have sats of 84% on room air.  Patient admitted for acute COVID-19 infection and placed on IV remdesivir, IV Solu-Medrol, nebulizer treatments.  Urinalysis also concerning for UTI and as such urine cultures obtained and pending and patient placed empirically on IV Rocephin. BSE requested.  ?  ?Assessment / Plan / Recommendation  ?Clinical Impression ? Clinical swallow evaluation completed while Pt seated up in recliner. Oral motor examination is WNL, Pt wears U/L dentures. She followed commands, but minimally talkative. Pt consumed ice chips, thin water via straw, puree, and regular textures without overt signs of aspiration and no reports of globus. Pt with mildly prolonged oral prep with solids, but no residuals post swallow. Ok to continue diet as ordered, D3/mech soft and thin liquids with aspiration and reflux precautions. Pt will likely need feeder assist at this time due to AMS. No further SLP services indicated at this time. ?SLP Visit Diagnosis: Dysphagia, unspecified (R13.10) ?   ?Aspiration Risk ? Mild aspiration risk  ?  ?Diet Recommendation Dysphagia 3 (Mech soft);Thin liquid  ? ?Liquid Administration via: Cup;Straw ?Medication Administration: Whole meds with  puree ?Supervision: Staff to assist with self feeding;Full supervision/cueing for compensatory strategies ?Compensations: Slow rate;Small sips/bites ?Postural Changes: Seated upright at 90 degrees;Remain upright for at least 30 minutes after po intake  ?  ?Other  Recommendations Oral Care Recommendations: Oral care BID;Staff/trained caregiver to provide oral care ?Other Recommendations: Clarify dietary restrictions   ? ?Recommendations for  follow up therapy are one component of a multi-disciplinary discharge planning process, led by the attending physician.  Recommendations may be updated based on patient status, additional functional criteria and insurance authorization. ? ?Follow up Recommendations No SLP follow up  ? ? ?  ?Assistance Recommended at Discharge Frequent or constant Supervision/Assistance  ?Functional Status Assessment Patient has not had a recent decline in their functional status  ?Frequency and Duration    ?  ?  ?   ? ?Prognosis Prognosis for Safe Diet Advancement: Good ?Barriers to Reach Goals: Cognitive deficits  ? ?  ? ?Swallow Study   ?General Date of Onset: 08/09/21 ?HPI: Patient 71 year old female history significant for dementia, COPD not on home O2, depression, CHF, HOH, history of urinary retention status post chronic Foley catheter, history of hydroureteronephrosis, hypertension, hyperlipidemia, hypothyroidism, history of CVA presented to the ED from nursing facility for evaluation of hypoxia.  It is noted that patient had recently been diagnosed with COVID however unsure of exact date.  EMS was called to facility as patient was noted to be hypoxic.  Patient subsequently brought to the ED and noted to have sats of 84% on room air.  Patient admitted for acute COVID-19 infection and placed on IV remdesivir, IV Solu-Medrol, nebulizer treatments.  Urinalysis also concerning for UTI and as such urine cultures obtained and pending and patient placed empirically on IV Rocephin. BSE requested. ?Type of Study: Bedside Swallow Evaluation ?Previous Swallow Assessment: 06/2016 D2/thin at [facility] ?Diet Prior to this Study: Dysphagia 3 (soft);Thin liquids ?Temperature Spikes Noted: No ?Respiratory Status: Room air ?History of Recent Intubation: No ?Behavior/Cognition: Alert;Cooperative;Requires cueing ?Oral Cavity Assessment: Within Functional Limits ?Oral Care Completed by SLP: Yes ?Oral Cavity - Dentition: Dentures, top;Dentures,  bottom ?Vision: Impaired for self-feeding ?Self-Feeding Abilities: Needs assist ?Patient Positioning: Upright in chair ?Baseline Vocal Quality: Normal ?Volitional Cough: Cognitively unable to elicit ?Volitional Swallow: Able to elicit  ?  ?Oral/Motor/Sensory Function Overall Oral Motor/Sensory Function: Within functional limits   ?Ice Chips Ice chips: Within functional limits ?Presentation: Spoon   ?Thin Liquid Thin Liquid: Within functional limits ?Presentation: Cup;Straw  ?  ?Nectar Thick Nectar Thick Liquid: Not tested   ?Honey Thick Honey Thick Liquid: Not tested   ?Puree Puree: Within functional limits ?Presentation: Spoon   ?Solid ? ? ?  Solid: Within functional limits ?Presentation: Spoon  ? ?  ?Thank you, ? ?Havery Moros, CCC-SLP ?[phone] ? ?Zyrah Wiswell ?08/12/2021,3:53 PM ? ? ? ? ?

## 2021-08-12 NOTE — Progress Notes (Signed)
Both lab tech and nursing staff unable to assess a sustainable vein on pt. Each attempt blew. Will pass to on coming nurse in shift change ?

## 2021-08-12 NOTE — TOC Progression Note (Signed)
Transition of Care (TOC) - Progression Note  ? ? ?Patient Details  ?Name: Kimberly [name] ?MRN: [id number] ?Date of Birth: 01-06-1951 ? ?Transition of Care (TOC) CM/SW Contact  ?Dollene Mallery D, LCSW ?Phone Number: ?08/12/2021, 4:07 PM ? ?Clinical Narrative:    ?DAughter, Ms. Soy, plans for patient to return to [facility] if they can manage her at d/c. ? ? ?Expected Discharge Plan: Assisted Living ?Barriers to Discharge: Continued Medical Work up ? ?Expected Discharge Plan and Services ?Expected Discharge Plan: Assisted Living ?  ?  ?  ?  ?                ?  ?  ?  ?  ?  ?  ?  ?  ?  ?  ? ? ?Social Determinants of Health (SDOH) Interventions ?  ? ?Readmission Risk Interventions ?No flowsheet data found. ? ?

## 2021-08-12 NOTE — Progress Notes (Signed)

## 2021-08-13 DIAGNOSIS — Z978 Presence of other specified devices: Secondary | ICD-10-CM

## 2021-08-13 DIAGNOSIS — F419 Anxiety disorder, unspecified: Secondary | ICD-10-CM | POA: Diagnosis not present

## 2021-08-13 DIAGNOSIS — D649 Anemia, unspecified: Secondary | ICD-10-CM | POA: Diagnosis not present

## 2021-08-13 DIAGNOSIS — U071 COVID-19: Secondary | ICD-10-CM | POA: Diagnosis not present

## 2021-08-13 DIAGNOSIS — J9601 Acute respiratory failure with hypoxia: Secondary | ICD-10-CM | POA: Diagnosis not present

## 2021-08-13 LAB — CBC WITH DIFFERENTIAL/PLATELET
Abs Immature Granulocytes: 0.05 10*3/uL (ref 0.00–0.07)
Basophils Absolute: 0 10*3/uL (ref 0.0–0.1)
Basophils Relative: 0 %
Eosinophils Absolute: 0 10*3/uL (ref 0.0–0.5)
Eosinophils Relative: 1 %
HCT: 35.5 % — ABNORMAL LOW (ref 36.0–46.0)
Hemoglobin: 10.8 g/dL — ABNORMAL LOW (ref 12.0–15.0)
Immature Granulocytes: 1 %
Lymphocytes Relative: 20 %
Lymphs Abs: 1.3 10*3/uL (ref 0.7–4.0)
MCH: 27.8 pg (ref 26.0–34.0)
MCHC: 30.4 g/dL (ref 30.0–36.0)
MCV: 91.5 fL (ref 80.0–100.0)
Monocytes Absolute: 0.4 10*3/uL (ref 0.1–1.0)
Monocytes Relative: 6 %
Neutro Abs: 4.7 10*3/uL (ref 1.7–7.7)
Neutrophils Relative %: 72 %
Platelets: 248 10*3/uL (ref 150–400)
RBC: 3.88 MIL/uL (ref 3.87–5.11)
RDW: 13.8 % (ref 11.5–15.5)
WBC: 6.4 10*3/uL (ref 4.0–10.5)
nRBC: 0 % (ref 0.0–0.2)

## 2021-08-13 LAB — COMPREHENSIVE METABOLIC PANEL
ALT: 54 U/L — ABNORMAL HIGH (ref 0–44)
AST: 29 U/L (ref 15–41)
Albumin: 2.4 g/dL — ABNORMAL LOW (ref 3.5–5.0)
Alkaline Phosphatase: 60 U/L (ref 38–126)
Anion gap: 6 (ref 5–15)
BUN: 14 mg/dL (ref 8–23)
CO2: 26 mmol/L (ref 22–32)
Calcium: 8.4 mg/dL — ABNORMAL LOW (ref 8.9–10.3)
Chloride: 111 mmol/L (ref 98–111)
Creatinine, Ser: 0.6 mg/dL (ref 0.44–1.00)
GFR, Estimated: >60 mL/min (ref >60)
Glucose, Bld: 127 mg/dL — ABNORMAL HIGH (ref 70–99)
Potassium: 4.6 mmol/L (ref 3.5–5.1)
Sodium: 143 mmol/L (ref 135–145)
Total Bilirubin: 0.2 mg/dL — ABNORMAL LOW (ref 0.3–1.2)
Total Protein: 5.3 g/dL — ABNORMAL LOW (ref 6.5–8.1)

## 2021-08-13 LAB — GLUCOSE, CAPILLARY
Glucose-Capillary: 105 mg/dL — ABNORMAL HIGH (ref 70–99)
Glucose-Capillary: 155 mg/dL — ABNORMAL HIGH (ref 70–99)

## 2021-08-13 LAB — MAGNESIUM: Magnesium: 2.1 mg/dL (ref 1.7–2.4)

## 2021-08-13 LAB — PHOSPHORUS: Phosphorus: 2.6 mg/dL (ref 2.5–4.6)

## 2021-08-13 LAB — FERRITIN: Ferritin: 147 ng/mL (ref 11–307)

## 2021-08-13 LAB — D-DIMER, QUANTITATIVE: D-Dimer, Quant: 1.84 ug/mL-FEU — ABNORMAL HIGH (ref 0.00–0.50)

## 2021-08-13 LAB — C-REACTIVE PROTEIN: CRP: 1.2 mg/dL — ABNORMAL HIGH (ref <1.0)

## 2021-08-13 MED ORDER — LORAZEPAM 0.5 MG PO TABS
0.5000 mg | ORAL_TABLET | Freq: Two times a day (BID) | ORAL | 0 refills | Status: AC
Start: 2021-08-13 — End: ?

## 2021-08-13 MED ORDER — PANTOPRAZOLE SODIUM 40 MG PO TBEC
40.0000 mg | DELAYED_RELEASE_TABLET | Freq: Every day | ORAL | 0 refills | Status: AC
Start: 2021-08-14 — End: ?

## 2021-08-13 MED ORDER — ZINC SULFATE 220 (50 ZN) MG PO CAPS
220.0000 mg | ORAL_CAPSULE | Freq: Every day | ORAL | 0 refills | Status: AC
Start: 2021-08-14 — End: ?

## 2021-08-13 MED ORDER — PREDNISONE 20 MG PO TABS
ORAL_TABLET | ORAL | 0 refills | Status: AC
Start: 2021-08-14 — End: 2021-08-20

## 2021-08-13 MED ORDER — ASCORBIC ACID 500 MG PO TABS
500.0000 mg | ORAL_TABLET | Freq: Every day | ORAL | 0 refills | Status: AC
Start: 2021-08-14 — End: ?

## 2021-08-13 MED ORDER — ASPIRIN 81 MG PO TBEC
81.0000 mg | DELAYED_RELEASE_TABLET | Freq: Every day | ORAL | 11 refills | Status: AC
Start: 2021-08-14 — End: ?

## 2021-08-13 MED ORDER — IPRATROPIUM-ALBUTEROL 20-100 MCG/ACT IN AERS: 1.0000 | INHALATION_SPRAY | Freq: Four times a day (QID) | RESPIRATORY_TRACT | 0 refills | Status: AC | PRN

## 2021-08-13 MED ORDER — GUAIFENESIN 100 MG/5ML PO LIQD
15.0000 mL | Freq: Two times a day (BID) | ORAL | 0 refills | Status: AC
Start: 2021-08-13 — End: 2021-08-18

## 2021-08-13 NOTE — Progress Notes (Signed)
Patient refused to take levothyroxine. MD notified. ?

## 2021-08-13 NOTE — [place] FL2 (Signed)
?[place] MEDICAID FL2 LEVEL OF CARE SCREENING TOOL  ?  ? ?IDENTIFICATION  ?Patient Name: ?Kimberly [name] Birthdate: January 15, 1951 Sex: female Admission Date (Current Location): ?08/09/2021  ?[place] ?  Facility and Address:  ?[facility],  [address], [place] [id number] ?     Provider Number: ?[id number]  ?Attending Physician Name and Address:  ?Rodolph Bonghompson, Daniel V, MD ? Relative Name and Phone Number:  ?  ?   ?Current Level of Care: ?Hospital Recommended Level of Care: ?Assisted Living Facility Prior Approval Number: ?  ? ?Date Approved/Denied: ?  PASRR Number: ?  ? ?Discharge Plan: ?Domiciliary (Rest home) ?  ? ?Current Diagnoses: ?Patient Active Problem List  ? Diagnosis Date Noted  ? Chronic indwelling Foley catheter   ? COVID-19 virus infection   ? Hypoxia 08/09/2021  ? Acute hypoxemic respiratory failure due to COVID-19 [facility](HCC) 08/09/2021  ? UTI (urinary tract infection) 08/09/2021  ? Vitamin B12 deficiency anemia due to intrinsic factor deficiency 04/12/2020  ? Encounter for fitting and adjustment of urinary device 05/27/2019  ? Heart failure, unspecified (HCC) 02/17/2019  ? Hypertensive heart disease with heart failure (HCC) 02/17/2019  ? 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 Personal history of urinary (tract) infections 10/11/2018  ? Anxiety disorder, unspecified 05/26/2018  ? Chronic obstructive pulmonary disease, unspecified (HCC) 05/26/2018  ? Nicotine dependence, cigarettes, uncomplicated 05/26/2018  ? Thoracic, thoracolumbar and lumbosacral intervertebral disc disorder 05/26/2018  ? Protein-calorie malnutrition, severe 04/23/2018  ? Hydroureteronephrosis 04/19/2018  ? ARF (acute renal failure) (HCC) 11/24/2017  ? MRSA bacteremia 12/31/2016  ? Sepsis secondary to UTI (HCC) 12/30/2016  ? Urinary retention 07/18/2016  ? Essential hypertension 07/18/2016  ? Hyperlipidemia 07/18/2016  ? Ischemic stroke (HCC) - L insular  embolic infarct s/p tPA, source unknown 07/14/2016  ? Acute right hemiparesis (HCC)   ? Dysarthria   ? Dementia without behavioral disturbance (HCC)   ? Syncope 11/02/2013  ? Syncope and collapse 11/02/2013  ? Blind 11/02/2013  ? Hypothyroidism 11/02/2013  ? History of CVA (cerebrovascular accident) 11/02/2013  ? Bradycardia 11/02/2013  ? Tobacco abuse 11/02/2013  ? Memory disorder 08/02/2013  ? Abnormality of gait 08/02/2013  ? Chronic diastolic heart failure (HCC) 12/19/2011  ? ANEMIA 05/04/2009  ? LEUKOCYTOSIS UNSPECIFIED 05/04/2009  ? FISTULA, INTESTINOVESICAL 05/04/2009  ? ABDOMINAL PAIN, LOWER 05/04/2009  ? ? ?Orientation RESPIRATION BLADDER Height & Weight   ?  ?Self ? Normal Continent Weight: 82 lb 12.8 oz (37.6 kg) ?Height:  5\' 1"  (154.9 cm)  ?BEHAVIORAL SYMPTOMS/MOOD NEUROLOGICAL BOWEL NUTRITION STATUS  ?    Continent Diet (DYS 3, thin liquids)  ?AMBULATORY STATUS COMMUNICATION OF NEEDS Skin   ?Limited Assist   Normal ?  ?  ?  ?    ?     ?     ? ? ?Personal Care Assistance Level of Assistance  ?Bathing, Feeding, Dressing Bathing Assistance: Limited assistance ?Feeding assistance: Independent ?Dressing Assistance: Limited assistance ?   ? ?Functional Limitations Info  ?    ?  ?   ? ? ?SPECIAL CARE FACTORS FREQUENCY  ?PT (By licensed PT)   ?  ?PT Frequency: 3x/week ?  ?  ?  ?  ?   ? ? ?Contractures Contractures Info: Not present  ? ? ?Additional Factors Info  ?Code Status, Allergies, Isolation Precautions Code Status Info: DNR ?Allergies Info: NKA ?  ?  ?Isolation Precautions Info: 08/09/21 COVID + ?   ? ?  Current Medications (08/13/2021):  This is the current hospital active medication list ?Current Facility-Administered Medications  ?Medication Dose Route Frequency Provider Last Rate Last Admin  ? acetaminophen (TYLENOL) tablet 650 mg  650 mg Oral BID Rodolph Bong, MD   650 mg at 08/13/21 5188  ? acetaminophen (TYLENOL) tablet 650 mg  650 mg Oral Q6H PRN Rodolph Bong, MD      ? ARIPiprazole  (ABILIFY) tablet 2 mg  2 mg Oral QHS Rodolph Bong, MD   2 mg at 08/12/21 2257  ? ascorbic acid (VITAMIN C) tablet 500 mg  500 mg Oral Daily Rodolph Bong, MD   500 mg at 08/13/21 4166  ? aspirin EC tablet 81 mg  81 mg Oral Daily Rodolph Bong, MD   81 mg at 08/13/21 0630  ? atorvastatin (LIPITOR) tablet 10 mg  10 mg Oral QPM Rodolph Bong, MD   10 mg at 08/12/21 1713  ? budesonide (PULMICORT) nebulizer solution 0.5 mg  0.5 mg Nebulization BID Rodolph Bong, MD   0.5 mg at 08/13/21 0845  ? Chlorhexidine Gluconate Cloth 2 % PADS 6 each  6 each Topical Daily Rodolph Bong, MD   6 each at 08/13/21 1129  ? chlorpheniramine-HYDROcodone 10-8 MG/5ML suspension 5 mL  5 mL Oral Q12H PRN Rodolph Bong, MD      ? cholecalciferol (VITAMIN D3) tablet 800 Units  800 Units Oral Daily Rodolph Bong, MD   800 Units at 08/13/21 [phone]  ? donepezil (ARICEPT) tablet 5 mg  5 mg Oral QHS Rodolph Bong, MD   5 mg at 08/12/21 2225  ? enoxaparin (LOVENOX) injection 30 mg  30 mg Subcutaneous Q24H Rodolph Bong, MD   30 mg at 08/12/21 2229  ? feeding supplement (ENSURE ENLIVE / ENSURE PLUS) liquid 237 mL  237 mL Oral BID BM Rodolph Bong, MD   237 mL at 08/13/21 0926  ? guaiFENesin (ROBITUSSIN) 100 MG/5ML liquid 15 mL  15 mL Oral BID Rodolph Bong, MD   15 mL at 08/13/21 0932  ? insulin aspart (novoLOG) injection 0-15 Units  0-15 Units Subcutaneous TID WC Rodolph Bong, MD   3 Units at 08/12/21 1713  ? ipratropium-albuterol (DUONEB) 0.5-2.5 (3) MG/3ML nebulizer solution 3 mL  3 mL Nebulization BID Rodolph Bong, MD   3 mL at 08/13/21 0846  ? levothyroxine (SYNTHROID) tablet 25 mcg  25 mcg Oral Daily Rodolph Bong, MD   25 mcg at 08/12/21 0542  ? loratadine (CLARITIN) tablet 10 mg  10 mg Oral Daily Rodolph Bong, MD   10 mg at 08/13/21 3557  ? LORazepam (ATIVAN) tablet 0.5 mg  0.5 mg Oral BID Rodolph Bong, MD   0.5 mg at 08/13/21 3220  ? memantine (NAMENDA)  tablet 10 mg  10 mg Oral BID Rodolph Bong, MD   10 mg at 08/13/21 2542  ? mirtazapine (REMERON) tablet 7.5 mg  7.5 mg Oral QHS Rodolph Bong, MD   7.5 mg at 08/12/21 2224  ? multivitamin with minerals tablet 1 tablet  1 tablet Oral Daily Rodolph Bong, MD   1 tablet at 08/13/21 7062  ? multivitamin-lutein (OCUVITE-LUTEIN) capsule 1 capsule  1 capsule Oral Daily Rodolph Bong, MD   1 capsule at 08/13/21 3762  ? ondansetron (ZOFRAN) tablet 4 mg  4 mg Oral Q6H PRN Rodolph Bong, MD      ? Or  ?  ondansetron (ZOFRAN) injection 4 mg  4 mg Intravenous Q6H PRN Rodolph Bong, MD      ? pantoprazole (PROTONIX) EC tablet 40 mg  40 mg Oral Daily Rodolph Bong, MD   40 mg at 08/13/21 0938  ? polyethylene glycol (MIRALAX / GLYCOLAX) packet 17 g  17 g Oral Daily PRN Rodolph Bong, MD      ? predniSONE (DELTASONE) tablet 50 mg  50 mg Oral Daily Rodolph Bong, MD   50 mg at 08/13/21 1829  ? senna (SENOKOT) tablet 8.6 mg  1 tablet Oral BID Rodolph Bong, MD   8.6 mg at 08/13/21 9371  ? sertraline (ZOLOFT) tablet 75 mg  75 mg Oral QHS Rodolph Bong, MD   75 mg at 08/12/21 2223  ? sodium chloride flush (NS) 0.9 % injection 10-40 mL  10-40 mL Intracatheter Q12H Rodolph Bong, MD   10 mL at 08/13/21 6967  ? sodium chloride flush (NS) 0.9 % injection 10-40 mL  10-40 mL Intracatheter PRN Rodolph Bong, MD      ? sodium chloride flush (NS) 0.9 % injection 3 mL  3 mL Intravenous Q12H Rodolph Bong, MD   3 mL at 08/13/21 8938  ? sodium phosphate (FLEET) 7-19 GM/118ML enema 1 enema  1 enema Rectal Once PRN Rodolph Bong, MD      ? sorbitol 70 % solution 30 mL  30 mL Oral Daily PRN Rodolph Bong, MD      ? vitamin A & D ointment 1 application.  1 application. Topical BID Rodolph Bong, MD   1 application. at 08/13/21 1017  ? zinc sulfate capsule 220 mg  220 mg Oral Daily Rodolph Bong, MD   220 mg at 08/13/21 5102  ? ? ? ?Discharge Medications: ?TAKE  these medications   ?  ?acetaminophen 650 MG CR tablet ?Commonly known as: TYLENOL ?Take 650 mg by mouth 2 (two) times daily. ?   ?ARIPiprazole 2 MG tablet ?Commonly known as: ABILIFY ?Take 2 mg by mouth at bedtime.

## 2021-08-13 NOTE — TOC Progression Note (Signed)
Transition of Care (TOC) - Progression Note  ? ? ?Patient Details  ?Name: Kimberly [name] ?MRN: [id number] ?Date of Birth: 1950/07/29 ? ?Transition of Care (TOC) CM/SW Contact  ?Karn Cassis, LCSW ?Phone Number: ?08/13/2021, 11:25 AM ? ?Clinical Narrative:  LCSW spoke with [name] at [facility]. [name] indicates pt requires extensive assist with ADLs at baseline. Discussed PT evaluation/recommendations. [name] feels this is close to pt's baseline and pt can return. Will add PT to home health orders. Pt active with [facility]. D/C later today. Trudy aware.   ? ? ? ?Expected Discharge Plan: Assisted Living ?Barriers to Discharge: Continued Medical Work up ? ?Expected Discharge Plan and Services ?Expected Discharge Plan: Assisted Living ?  ?  ?  ?  ?                ?  ?  ?  ?  ?  ?  ?  ?  ?  ?  ? ? ?Social Determinants of Health (SDOH) Interventions ?  ? ?Readmission Risk Interventions ?No flowsheet data found. ? ?

## 2021-08-13 NOTE — Discharge Summary (Signed)
Physician Discharge Summary  ?Kimberly [name] [id number] DOB: August 21, 1950 DOA: 08/09/2021 ? ?PCP: Pcp, No ? ?Admit date: 08/09/2021 ?Discharge date: 08/13/2021 ? ?Time spent: 60 minutes ? ?Recommendations for Outpatient Follow-up:  ?Follow-up with PCP/MD at facility in 2 weeks.  On follow-up patient will need a basic metabolic profile done to follow-up on electrolytes and renal function.  Patient's hypothyroidism will need to be followed up upon and patient will need repeat TFTs done in about 4 to 6 weeks. ? ? ?Discharge Diagnoses:  ?Principal Problem: ?  Acute hypoxemic respiratory failure due to COVID-19 [facility]) ?Active Problems: ?  Hypoxia ?  UTI (urinary tract infection) ?  ANEMIA ?  Chronic diastolic heart failure (HCC) ?  Hypothyroidism ?  History of CVA (cerebrovascular accident) ?  Tobacco abuse ?  Dementia without behavioral disturbance (HCC) ?  Urinary retention ?  Essential hypertension ?  Hyperlipidemia ?  Protein-calorie malnutrition, severe ?  Anxiety disorder, unspecified ?  Chronic obstructive pulmonary disease, unspecified (HCC) ?  COVID-19 virus infection ?  Chronic indwelling Foley catheter ? ? ?Discharge Condition: Stable and improved ? ?Diet recommendation: Dysphagia 3 diet with thin liquids. ? ?Filed Weights  ? 08/09/21 1654 08/10/21 0807  ?Weight: 54.4 kg 37.6 kg  ? ? ?History of present illness:  ? Kimberly [name] is a 71 y.o. female with medical history significant of significant dementia, COPD not on home O2, depression, CHF, hard of hearing, history of urinary retention status post chronic Foley catheter, history of hydroureteronephrosis, hypertension, hyperlipidemia, hypothyroidism, history of CVA presenting to the ED from skilled nursing facility for evaluation of hypoxia.  Patient with severe dementia and as such history obtained by ED physician and per notes on epic.  It is noted that patient was recently diagnosed with COVID (with patient endorses) and noted to be hypoxic and as such EMS  was called.  It was noted per ED physician note that when EMS got there patient had sats of 91% on room air placed on 2 L nasal cannula.  On presentation patient denies any fevers, no chills, no shortness of breath, no chest pain, no abdominal pain.  Patient does endorse some dysuria although she has a chronic Foley catheter.  Patient noted to have a cough on gurney.  Due to patient dementia history from patient unreliable.  Per ED physician concerned there may have been a COVID breakout at facility as this was the second patient that had presented to the ED COVID-positive with hypoxia.  Unknown exactly when COVID was first diagnosed. ?ED Course: Patient seen in the ED COVID-19 PCR pending.  Patient noted to have sats of 84% on room air per RN on presentation to the ED and placed on 2 L nasal cannula.  Comprehensive metabolic profile with a glucose of 104, calcium of 8.8, albumin of 3.0 otherwise was within normal limits.  Inflammatory markers with LDH of 125, ferritin of 104, lactic acid of 1.0, procalcitonin < 0.1, CBC unremarkable.  D-dimer 0.93.  INR 1.0.  Chest x-ray with no acute abnormalities.  Urinalysis turbid, moderate leukocytes, nitrite negative, many bacteria, WBC > 50.  Patient given a dose of Solu-Medrol 125 mg IV x1 in the ED.  Hospitalist were called to admit the patient for further evaluation and management. ? ?Hospital Course:  ? ?Assessment and Plan: ?* Acute hypoxemic respiratory failure due to COVID-19 [facility]) ?Patient presented with hypoxia from facility, recent diagnosis of COVID-19 however actual date unknown. ?-COVID-19 PCR done on presentation was positive (08/09/2021 ).  p ?-Patient noted in ED to have sats of 84% on room air and placed on 2 L nasal cannula. ?-Inflammatory markers with elevated D-dimer, elevated CRP.  Ferritin noted at 116.  LDH at 125.   ?-Patient placed on IV remdesivir and completed a 5-day course. ?-Patient placed on IV Solu-Medrol and subsequently transition to oral  prednisone taper which she will be discharged on.  ?-Patient also maintained Claritin, vitamin C, zinc, Flonase, Robitussin twice daily. ?-Patient was placed on inhalers of Dulera and Combivent however due to dementia it is noted per RT that patient unable to follow commands and as such Combivent and Dulera were discontinued and patient placed on scheduled DuoNebs as well as scheduled Pulmicort.  ?-Patient improved clinically during the hospitalization such that by day of discharge patient with sats of 98% on room air. ?-Patient be discharged back to assisted living facility with home health therapies. ?-Patient will need to quarantine for 5 more days to complete a 10-day course of isolation. ?-Wean oxygen. ?-Supportive care. ?-Follow inflammatory markers. ? ?Hypoxia ?- See above. ? ?UTI (urinary tract infection) ?- Patient with chronic indwelling Foley catheter. ?-? CAUTI ?-Urinalysis done concerning for UTI.  Urine cultures with multiple species. ?-Exchanged Foley catheter and repeat UA with cultures and sensitivities placed. ?-Repeat urine cultures which are ordered with UA after Foley exchange was somehow canceled. ?-Patient received a total of 4 days of IV Rocephin.  Patient remained afebrile. ?-No further antibiotics needed on discharge. ? ?COVID-19 virus infection ?- See above. ? ?Chronic obstructive pulmonary disease, unspecified (HCC) ?- Stable. ?-Patient unable to follow commands to use inhalers appropriately and as such Dulera and Combivent were discontinued and patient started on Pulmicort nebs and DuoNebs.   ?-Outpatient follow-up. ? ?Anxiety disorder, unspecified ?- Patient maintained on home regimen anxiolytics and Zoloft. ? ?Protein-calorie malnutrition, severe ?- Patient maintained on Ensure supplementation. ? ?Hyperlipidemia ?- Patient maintained on home regimen statin. ? ?Essential hypertension ?- BP stable. ?-Not on any antihypertensive medications. ? ?Urinary retention ?- Patient with chronic  Foley catheter. ?-Exchanged Foley catheter. ?-Outpatient follow-up. ? ?Dementia without behavioral disturbance (HCC) ?- Patient maintained on home regimen Abilify, Namenda, Aricept, Remeron. ?-Outpatient follow-up with PCP. ? ?Tobacco abuse ?- Patient with dementia, tobacco cessation stressed. ? ?History of CVA (cerebrovascular accident) ?- Patient maintained on home regimen statin. ?-Not on aspirin prior to admission. ?-Patient started on low-dose aspirin 81 mg daily. ?-PT/OT/SLP assessed patient. ?-Patient with discharge back to ALF with home health therapies. ? ?Hypothyroidism ?- Patient placed on home regimen Synthroid. ?-Outpatient follow-up with TFTs in 4 to 6 weeks. ? ?Chronic diastolic heart failure (HCC) ?- Stable. ? ?ANEMIA ?- H&H remained stable.   ?-Patient placed on PPI.   ?-Outpatient follow-up.  ? ? ? ? ?  ? ?Procedures: ?CT angiogram chest 08/09/2021 ?Chest x-ray 08/09/2021 ?Midline 08/12/2021 ?  ? ?Consultations: ?None ? ?Discharge Exam: ?Vitals:  ? 08/13/21 0846 08/13/21 1224  ?BP:  119/60  ?Pulse:  81  ?Resp:  15  ?Temp:  98.6 ?F (37 ?C)  ?SpO2: 95% 98%  ? ? ?General: NAD. ?Cardiovascular: Regular rate rhythm no murmurs rubs or gallops.  No JVD.  No lower extremity edema. ?Respiratory: Clear to auscultation bilaterally anterior lung fields.  No wheezing.  Fair air movement.  Speaking in full sentences. ? ?Discharge Instructions ? ? ?Discharge Instructions   ? ? Diet - low sodium heart healthy   Complete by: As directed ?  ? Dysphagia 3 diet with thin liquids.  ?  Discharge instructions   Complete by: As directed ?  ? Please quarantine for another 5 days. ? ?? ? ? ?Person Under Monitoring Name: Kimberly [name] ? ?Location: [facility] ?[address] ?[place] [address] ? ? ?Infection Prevention Recommendations for Individuals Confirmed to have, ?or Being Evaluated for, 2019 Novel Coronavirus (COVID-19) Infection Who ?Receive Care at Home ? ?Individuals who are confirmed to have, or are being evaluated  for, COVID-19 should follow the prevention steps below ?until a healthcare provider or local or state health department says they can return to normal activities. ? ?Stay home except to get medical care ?You sh

## 2021-08-13 NOTE — Progress Notes (Signed)
OT Screen Note ? ?Patient Details ?Name: Kimberly [name] ?MRN: [id number] ?DOB: Aug 04, 1950 ? ? ?Cancelled Treatment:    Reason Eval/Treat Not Completed: OT screened, no needs identified, will sign off.  ?Patient from [facility] and requires extensive assist with ADLs at baseline. Discharge plan is to return to [facility]. At this time I do not see any acute OT needs. Once patient returns to [facility], staff OT may evaluate patient if needed.  ?Thank you for the referral.  ? ?Limmie Patricia, OTR/L,CBIS  ?[phone] ? ?08/13/2021, 3:07 PM ?

## 2021-08-14 LAB — CULTURE, BLOOD (ROUTINE X 2)
Culture: NO GROWTH
Culture: NO GROWTH
Special Requests: ADEQUATE
Special Requests: ADEQUATE

## 2021-12-05 ENCOUNTER — Other Ambulatory Visit (HOSPITAL_COMMUNITY): Payer: Self-pay | Admitting: Family Medicine

## 2021-12-05 ENCOUNTER — Other Ambulatory Visit: Payer: Self-pay | Admitting: Family Medicine

## 2021-12-05 DIAGNOSIS — J189 Pneumonia, unspecified organism: Secondary | ICD-10-CM

## 2021-12-05 DIAGNOSIS — R9389 Abnormal findings on diagnostic imaging of other specified body structures: Secondary | ICD-10-CM

## 2021-12-20 ENCOUNTER — Encounter (HOSPITAL_COMMUNITY): Payer: Self-pay

## 2021-12-20 ENCOUNTER — Ambulatory Visit (HOSPITAL_COMMUNITY)
Admission: RE | Admit: 2021-12-20 | Discharge: 2021-12-20 | Disposition: A | Discharge status: Home / Self Care | Payer: Medicare Other | Source: Ambulatory Visit | Attending: Family Medicine | Admitting: Family Medicine

## 2021-12-20 DIAGNOSIS — R9389 Abnormal findings on diagnostic imaging of other specified body structures: Secondary | ICD-10-CM | POA: Diagnosis present

## 2021-12-20 DIAGNOSIS — J189 Pneumonia, unspecified organism: Secondary | ICD-10-CM | POA: Diagnosis present

## 2021-12-20 LAB — POCT I-STAT CREATININE: Creatinine, Ser: 0.7 mg/dL (ref 0.44–1.00)

## 2021-12-20 MED ORDER — IOHEXOL 300 MG/ML  SOLN
75.0000 mL | Freq: Once | INTRAMUSCULAR | Status: AC | PRN
  Administered 2021-12-20: 75 mL via INTRAVENOUS

## 2022-01-26 ENCOUNTER — Emergency Department (HOSPITAL_COMMUNITY): Payer: Medicare Other

## 2022-01-26 ENCOUNTER — Emergency Department (HOSPITAL_COMMUNITY)
Admission: EM | Admit: 2022-01-26 | Discharge: 2022-01-26 | Disposition: A | Discharge status: Skilled Nursing Facility | Payer: Medicare Other | Attending: Emergency Medicine | Admitting: Emergency Medicine

## 2022-01-26 ENCOUNTER — Other Ambulatory Visit: Payer: Self-pay

## 2022-01-26 ENCOUNTER — Encounter (HOSPITAL_COMMUNITY): Payer: Self-pay | Admitting: Emergency Medicine

## 2022-01-26 DIAGNOSIS — S0101XA Laceration without foreign body of scalp, initial encounter: Secondary | ICD-10-CM

## 2022-01-26 DIAGNOSIS — K8020 Calculus of gallbladder without cholecystitis without obstruction: Secondary | ICD-10-CM | POA: Insufficient documentation

## 2022-01-26 DIAGNOSIS — Z23 Encounter for immunization: Secondary | ICD-10-CM | POA: Diagnosis not present

## 2022-01-26 DIAGNOSIS — I509 Heart failure, unspecified: Secondary | ICD-10-CM | POA: Diagnosis not present

## 2022-01-26 DIAGNOSIS — I700 Atherosclerosis of aorta: Secondary | ICD-10-CM | POA: Diagnosis not present

## 2022-01-26 DIAGNOSIS — W1839XA Other fall on same level, initial encounter: Secondary | ICD-10-CM | POA: Diagnosis not present

## 2022-01-26 DIAGNOSIS — N200 Calculus of kidney: Secondary | ICD-10-CM | POA: Insufficient documentation

## 2022-01-26 DIAGNOSIS — I110 Hypertensive heart disease with heart failure: Secondary | ICD-10-CM | POA: Insufficient documentation

## 2022-01-26 DIAGNOSIS — Z7982 Long term (current) use of aspirin: Secondary | ICD-10-CM | POA: Diagnosis not present

## 2022-01-26 DIAGNOSIS — E039 Hypothyroidism, unspecified: Secondary | ICD-10-CM | POA: Insufficient documentation

## 2022-01-26 DIAGNOSIS — J449 Chronic obstructive pulmonary disease, unspecified: Secondary | ICD-10-CM | POA: Diagnosis not present

## 2022-01-26 DIAGNOSIS — S0990XA Unspecified injury of head, initial encounter: Secondary | ICD-10-CM | POA: Diagnosis present

## 2022-01-26 DIAGNOSIS — W19XXXA Unspecified fall, initial encounter: Secondary | ICD-10-CM

## 2022-01-26 DIAGNOSIS — Z79899 Other long term (current) drug therapy: Secondary | ICD-10-CM | POA: Diagnosis not present

## 2022-01-26 DIAGNOSIS — N390 Urinary tract infection, site not specified: Secondary | ICD-10-CM

## 2022-01-26 LAB — COMPREHENSIVE METABOLIC PANEL
ALT: 19 U/L (ref 0–44)
AST: 19 U/L (ref 15–41)
Albumin: 3.6 g/dL (ref 3.5–5.0)
Alkaline Phosphatase: 75 U/L (ref 38–126)
Anion gap: 8 (ref 5–15)
BUN: 13 mg/dL (ref 8–23)
CO2: 27 mmol/L (ref 22–32)
Calcium: 8.9 mg/dL (ref 8.9–10.3)
Chloride: 105 mmol/L (ref 98–111)
Creatinine, Ser: 0.7 mg/dL (ref 0.44–1.00)
GFR, Estimated: >60 mL/min (ref >60)
Glucose, Bld: 67 mg/dL — ABNORMAL LOW (ref 70–99)
Potassium: 3.8 mmol/L (ref 3.5–5.1)
Sodium: 140 mmol/L (ref 135–145)
Total Bilirubin: 0.5 mg/dL (ref 0.3–1.2)
Total Protein: 6.4 g/dL — ABNORMAL LOW (ref 6.5–8.1)

## 2022-01-26 LAB — URINALYSIS, ROUTINE W REFLEX MICROSCOPIC
Bilirubin Urine: NEGATIVE
Glucose, UA: NEGATIVE mg/dL
Hgb urine dipstick: NEGATIVE
Ketones, ur: 5 mg/dL — AB
Nitrite: POSITIVE — AB
Protein, ur: 100 mg/dL — AB
Specific Gravity, Urine: 1.011 (ref 1.005–1.030)
WBC, UA: >50 WBC/hpf — ABNORMAL HIGH (ref 0–5)
pH: 5 (ref 5.0–8.0)

## 2022-01-26 LAB — CBC
HCT: 41.7 % (ref 36.0–46.0)
Hemoglobin: 13.2 g/dL (ref 12.0–15.0)
MCH: 28.5 pg (ref 26.0–34.0)
MCHC: 31.7 g/dL (ref 30.0–36.0)
MCV: 90.1 fL (ref 80.0–100.0)
Platelets: 303 10*3/uL (ref 150–400)
RBC: 4.63 MIL/uL (ref 3.87–5.11)
RDW: 15.1 % (ref 11.5–15.5)
WBC: 5.4 10*3/uL (ref 4.0–10.5)
nRBC: 0 % (ref 0.0–0.2)

## 2022-01-26 LAB — CBG MONITORING, ED
Glucose-Capillary: 74 mg/dL (ref 70–99)
Glucose-Capillary: 98 mg/dL (ref 70–99)

## 2022-01-26 MED ORDER — TETANUS-DIPHTH-ACELL PERTUSSIS 5-2.5-18.5 LF-MCG/0.5 IM SUSY
0.5000 mL | PREFILLED_SYRINGE | Freq: Once | INTRAMUSCULAR | Status: AC
  Administered 2022-01-26: 0.5 mL via INTRAMUSCULAR
  Filled 2022-01-26: qty 0.5

## 2022-01-26 MED ORDER — CEPHALEXIN 500 MG PO CAPS
500.0000 mg | ORAL_CAPSULE | Freq: Once | ORAL | Status: AC
  Administered 2022-01-26: 500 mg via ORAL
  Filled 2022-01-26: qty 1

## 2022-01-26 MED ORDER — CEPHALEXIN 500 MG PO CAPS: 500.0000 mg | ORAL_CAPSULE | Freq: Four times a day (QID) | ORAL | 0 refills | Status: AC

## 2022-01-26 MED ORDER — LIDOCAINE-EPINEPHRINE-TETRACAINE (LET) TOPICAL GEL
3.0000 mL | Freq: Once | TOPICAL | Status: DC
  Filled 2022-01-26: qty 3

## 2022-01-26 NOTE — ED Notes (Signed)
Pt tried to get out of bed. Me and RN [facility] got pt positioned back in bed and covered up

## 2022-01-26 NOTE — ED Notes (Signed)
Patient transported to CT 

## 2022-01-26 NOTE — ED Triage Notes (Signed)
Pt arrived via [facility] from [facility] with c/o a witnessed fall today. Per EMS, pt has small laceration on back of her head. Hx of dementia, EMS denies pt being on blood thinners. Pt arrived with a foley catheter prior to ED arrival

## 2022-01-26 NOTE — ED Provider Notes (Signed)
  Physical Exam  BP 97/73   Pulse 68   Temp 97.8 F (36.6 C) (Oral)   Resp 13   SpO2 96%   Physical Exam  Procedures  Procedures  ED Course / MDM    Medical Decision Making Amount and/or Complexity of Data Reviewed Labs: ordered. Radiology: ordered.  Risk Prescription drug management.   Received patient in signout.  Fall.  Laceration.  CT scan showed some air in the kidney on the right side.  Could be progression out from Foley catheter, however urine done and showed potential infection.  Does have some kidney stones but no obstruction.  We will treat with antibiotics.  Culture sent.  Will be notified if susceptibilities do not match with culture.  Appears stable for discharge back to nursing home.       Benjiman Core, MD 01/26/22 1719

## 2022-01-26 NOTE — ED Notes (Signed)
Per [facility]' request due to their pharmacy not being open, the first dose of keflex po antibiotic given to pt.

## 2022-01-26 NOTE — Discharge Instructions (Signed)
The staples can come out in about 7 to 10 days.  The urine looks as if there is infection.  You will be notified if it does not match up with the antibiotics.

## 2022-01-26 NOTE — ED Notes (Signed)
Selena Batten, Nursing staff at [facility] called for update on patient at this time. Update given

## 2022-01-26 NOTE — ED Notes (Signed)
Unable to collect lab work via IV access site

## 2022-01-26 NOTE — ED Notes (Signed)
Pt given orange juice for low blood sugar of 67

## 2022-01-26 NOTE — ED Provider Notes (Addendum)
[facility] EMERGENCY DEPARTMENT Provider Note   CSN: [id number] Arrival date & time: 01/26/22  7829     History  Chief Complaint  Patient presents with   Marletta Lor    Kimberly [name] is a 71 y.o. female.   Fall  Patient presents after a fall.  Medical history includes anemia, CHF, hypothyroidism, CVA with right hemiparesis, dementia, HTN, HLD, anxiety, COPD, depression.  Currently, she lives in assisted living facility with the help of an aide.  Fall, which occurred prior to arrival, was witnessed.  At the time, she was walking to the dining area at her facility.  She slipped, causing her shoe to come off.  She subsequently fell.  During this fall, she struck the back of her head against a door frame.  She sustained a small wound to the back of her head.  EMS was called to the scene.  Patient was able to stand and ambulate a few steps to the EMS stretcher.  Staff at facility report that she is currently at her mental baseline.  She is not on any blood thinning medications.     Home Medications Prior to Admission medications   Medication Sig Start Date End Date Taking? Authorizing Provider  acetaminophen (TYLENOL) 650 MG CR tablet Take 650 mg by mouth 2 (two) times daily.   Yes [provider]  ARIPiprazole (ABILIFY) 2 MG tablet Take 2 mg by mouth at bedtime.   Yes [provider]  ascorbic acid (VITAMIN C) 500 MG tablet Take 1 tablet (500 mg total) by mouth daily. 08/14/21  Yes Rodolph Bong, MD  aspirin EC 81 MG EC tablet Take 1 tablet (81 mg total) by mouth daily. Swallow whole. 08/14/21  Yes Rodolph Bong, MD  atorvastatin (LIPITOR) 10 MG tablet Take 10 mg by mouth every evening.    Yes [provider]  cetirizine (ZYRTEC) 10 MG tablet Take 10 mg by mouth daily.   Yes [provider]  cholecalciferol (VITAMIN D) 400 UNITS TABS Take 800 Units by mouth daily.     Yes [provider]  donepezil (ARICEPT) 5 MG tablet TAKE 1 TABLET BY  MOUTH AT BEDTIME. Patient taking differently: Take 5 mg by mouth at bedtime. 01/15/15  Yes York Spaniel, MD  feeding supplement, ENSURE ENLIVE, (ENSURE ENLIVE) LIQD Take 237 mLs by mouth 2 (two) times daily between meals. 04/24/18  Yes Calvert Cantor, MD  Ipratropium-Albuterol (COMBIVENT) 20-100 MCG/ACT AERS respimat Inhale 1 puff into the lungs every 6 (six) hours as needed for wheezing. Use 3 times daily x 5 days then every 6 hours as needed. 08/13/21  Yes Rodolph Bong, MD  levothyroxine (SYNTHROID, LEVOTHROID) 25 MCG tablet Take 25 mcg by mouth daily.     Yes [provider]  LORazepam (ATIVAN) 0.5 MG tablet Take 1 tablet (0.5 mg total) by mouth 2 (two) times daily. 08/13/21  Yes Rodolph Bong, MD  memantine (NAMENDA) 10 MG tablet Take 10 mg by mouth 2 (two) times daily.   Yes [provider]  mirtazapine (REMERON) 7.5 MG tablet Take 7.5 mg by mouth at bedtime. 10/24/20  Yes [provider]  Multiple Vitamin (DAILY-VITE PO) Take 1 tablet by mouth daily.   Yes [provider]  pantoprazole (PROTONIX) 40 MG tablet Take 1 tablet (40 mg total) by mouth daily. 08/14/21  Yes Rodolph Bong, MD  sertraline (ZOLOFT) 50 MG tablet Take 75 mg by mouth at bedtime.    Yes [provider]  Vitamins A & D (VITAMIN A & D) ointment Apply 1 application topically 2 (two) times daily. Skin is washed, dried, then applied twice daily per [facility]   Yes [provider]  zinc sulfate 220 (50 Zn) MG capsule Take 1 capsule (220 mg total) by mouth daily. 08/14/21  Yes Rodolph Bonghompson, Daniel V, MD  multivitamin-lutein [facility](OCUVITE-LUTEIN) CAPS capsule Take 1 capsule by mouth daily. Patient not taking: Reported on 01/26/2022    [provider]  nitrofurantoin, macrocrystal-monohydrate, (MACROBID) 100 MG capsule Take 1 capsule (100 mg total) by mouth 2 (two) times daily. Patient not taking: Reported on 01/26/2022 01/14/20   Devoria AlbeKnapp, Iva, MD      Allergies    Patient has  no known allergies.    Review of Systems   Review of Systems  Unable to perform ROS: Dementia    Physical Exam Updated Vital Signs BP 111/63 (BP Location: Left Arm)   Pulse 66   Temp 97.7 F (36.5 C) (Rectal)   Resp 14   SpO2 96%  Physical Exam Vitals and nursing note reviewed.  Constitutional:      General: She is not in acute distress.    Appearance: Normal appearance. She is well-developed. She is not ill-appearing, toxic-appearing or diaphoretic.  HENT:     Head: Normocephalic.     Comments: Small, superficial, 1 cm laceration to upper occipital area.  Wound is hemostatic.    Right Ear: External ear normal.     Left Ear: External ear normal.     Nose: Nose normal.     Mouth/Throat:     Mouth: Mucous membranes are moist.     Pharynx: Oropharynx is clear.  Eyes:     Extraocular Movements: Extraocular movements intact.     Conjunctiva/sclera: Conjunctivae normal.  Cardiovascular:     Rate and Rhythm: Normal rate and regular rhythm.     Heart sounds: No murmur heard. Pulmonary:     Effort: Pulmonary effort is normal. No respiratory distress.     Breath sounds: Normal breath sounds. No wheezing or rales.  Chest:     Chest wall: No tenderness.  Abdominal:     General: There is no distension.     Palpations: Abdomen is soft.     Tenderness: There is no abdominal tenderness.  Genitourinary:    Comments: Chronic indwelling Foley catheter with clear urine output Musculoskeletal:        General: No swelling, tenderness or deformity. Normal range of motion.     Cervical back: Normal range of motion and neck supple. No rigidity.     Right lower leg: No edema.     Left lower leg: No edema.  Skin:    General: Skin is warm and dry.     Coloration: Skin is not jaundiced or pale.  Neurological:     General: No focal deficit present.     Mental Status: She is alert. Mental status is at baseline.  Psychiatric:        Mood and Affect: Mood normal.        Behavior: Behavior  normal.     ED Results / Procedures / Treatments   Labs (all labs ordered are listed, but only abnormal results are displayed) Labs Reviewed  COMPREHENSIVE METABOLIC PANEL - Abnormal; Notable for the following components:      Result Value   Glucose, Bld 67 (*)    Total Protein 6.4 (*)    All other components within normal limits  URINE CULTURE  CBC  URINALYSIS, ROUTINE W REFLEX MICROSCOPIC  CBG MONITORING, ED    EKG EKG Interpretation  Date/Time:  Sunday January 26 2022 08:57:33 EDT Ventricular Rate:  59 PR Interval:  127 QRS Duration: 121 QT Interval:  434 QTC Calculation: 427 R Axis:   56 Text Interpretation: Sinus rhythm Nonspecific intraventricular conduction delay Borderline T abnormalities, anterior leads Confirmed by Gloris Manchester [phone]) on 01/26/2022 10:16:26 AM  Radiology CT CHEST ABDOMEN PELVIS WO CONTRAST  Result Date: 01/26/2022 CLINICAL DATA:  Witnessed fall.  Blunt poly trauma. EXAM: CT CHEST, ABDOMEN AND PELVIS WITHOUT CONTRAST TECHNIQUE: Multidetector CT imaging of the chest, abdomen and pelvis was performed following the standard protocol without IV contrast. RADIATION DOSE REDUCTION: This exam was performed according to the departmental dose-optimization program which includes automated exposure control, adjustment of the mA and/or kV according to patient size and/or use of iterative reconstruction technique. COMPARISON:  Chest CT 12/12/2021.  Abdomen and pelvis CT 04/20/2018 FINDINGS: CT CHEST FINDINGS Cardiovascular: The heart size is normal. No substantial pericardial effusion. Coronary artery calcification is evident. No thoracic aortic aneurysm. No substantial atherosclerosis of the thoracic aorta. Mediastinum/Nodes: No mediastinal lymphadenopathy. No evidence for gross hilar lymphadenopathy although assessment is limited by the lack of intravenous contrast on the current study. The esophagus has normal imaging features. There is no axillary lymphadenopathy.  Lungs/Pleura: Centrilobular and paraseptal emphysema evident. 2 mm posterior right lower lobe pulmonary nodule identified on image 54/4, new since 12/12/2021. Small focus of tree-in-bud nodularity posterior right lower lobe on image 69/4 suggests infectious/inflammatory etiology including atypical infection. Given interval stability, scarring/sequelae of prior atypical infection would also be a consideration. 3 mm right lower lobe nodule on 62/4 is stable, likely scar. Stable 2 mm right lower lobe nodule on 79/4. Several additional scattered tiny pulmonary nodules are stable since prior. Calcified granulomas noted in the left upper lobe. Musculoskeletal: No worrisome lytic or sclerotic osseous abnormality. Stable compression deformity at T8, T11, and T12. CT ABDOMEN PELVIS FINDINGS Hepatobiliary: No suspicious focal abnormality in the liver on this study without intravenous contrast. 15 mm calcified gallstone evident. No intrahepatic or extrahepatic biliary dilation. Pancreas: No focal mass lesion. No dilatation of the main duct. No intraparenchymal cyst. No peripancreatic edema. Spleen: No splenomegaly. No focal mass lesion. Adrenals/Urinary Tract: Marked progression of a right renal stone filling the inferior right renal pelvis and measuring on the order of 2.4 x 0.9 x 0.9 cm. There is mild fullness of the right intrarenal collecting system with gas bubbles visible in the right intrarenal collecting system (see 52/2). No other tiny stone is seen in the lower pole right kidney. Several hyperattenuating lesions in the left kidney are progressive in the interval measuring up to 1.7 cm in the lower pole. These are well-defined and relatively homogeneous, likely proteinaceous or hemorrhagic cyst. No left hydronephrosis. No evidence for hydroureter. Probable 6 x 4 mm stone in the right UVJ (96/2). Foley catheter is noted in the urinary bladder gas in the bladder lumen is compatible with the instrumentation.  Stomach/Bowel: Stomach is unremarkable. No gastric wall thickening. No evidence of outlet obstruction. Duodenum is normally positioned as is the ligament of Treitz. No small bowel dilatation. No colonic dilatation. Advanced diverticulosis noted left colon without overt features of diverticulitis. Prominent stool volume noted in the rectosigmoid junction and rectum. Vascular/Lymphatic: There is moderate atherosclerotic calcification of the abdominal aorta without aneurysm. No abdominal lymphadenopathy although assessment is limited by lack of intraabdominal fat and lack of  intravenous contrast material. No pelvic sidewall lymphadenopathy. Reproductive: See below 14 mm cystic lesion right adnexal space, consistent with benign etiology given the long-term interval stability. Other: No intraperitoneal free fluid. Musculoskeletal: No worrisome lytic or sclerotic osseous abnormality. Degenerative disc disease noted L4-5 and L5-S1. IMPRESSION: 1. No evidence for acute traumatic injury in the chest, abdomen, or pelvis. Lack of intravenous contrast/since sensitivity for detecting solid organ injury. No free fluid in the peritoneal cavity. 2. Marked progression of a right renal stone filling the inferior right renal pelvis and measuring on the order of 2.4 x 0.9 x 0.9 cm. There is mild fullness of the right intrarenal collecting system with gas bubbles visible in the right intrarenal collecting system. Given the presence of a Foley catheter and gas in the bladder, small gas bubbles in the right intrarenal collecting system are presumably secondary to retrograde migration from the bladder. Nevertheless, correlation for signs/symptoms of pyonephrosis is recommended. 3. Probable 6 x 4 mm stone in the right UVJ. No evidence for hydroureteronephrosis. 4. Multiple hyperattenuating lesions in the left kidney, progressive in the interval, likely proteinaceous or hemorrhagic cysts. Attention on follow-up recommended. 5.  Cholelithiasis. 6. Stable compression fractures at T8, T11, and T12. 7. 2 mm posterior right lower lobe pulmonary nodule new since 12/12/2021. No follow-up needed if patient is low-risk.This recommendation follows the consensus statement: Guidelines for Management of Incidental Pulmonary Nodules Detected on CT Images: From the Fleischner Society 2017; Radiology 2017; 284:228-243. 8. Stable cluster of tree-in-bud nodularity posterior right lower lobe, likely sequelae of atypical infection. 9. Aortic Atherosclerosis (ICD10-I70.0) and Emphysema (ICD10-J43.9). Electronically Signed   By: Kennith Center M.D.   On: 01/26/2022 10:57   CT HEAD WO CONTRAST  Result Date: 01/26/2022 CLINICAL DATA:  Witnessed fall today. Small laceration to the back of the head. History of dementia. EXAM: CT HEAD WITHOUT CONTRAST CT CERVICAL SPINE WITHOUT CONTRAST TECHNIQUE: Multidetector CT imaging of the head and cervical spine was performed following the standard protocol without intravenous contrast. Multiplanar CT image reconstructions of the cervical spine were also generated. RADIATION DOSE REDUCTION: This exam was performed according to the departmental dose-optimization program which includes automated exposure control, adjustment of the mA and/or kV according to patient size and/or use of iterative reconstruction technique. COMPARISON:  Head CT, 01/14/2020. FINDINGS: CT HEAD FINDINGS Brain: No evidence of acute infarction, hemorrhage, hydrocephalus, extra-axial collection or mass lesion/mass effect. Small old lacunar infarct, right thalamus. Mild patchy white matter hypoattenuation consistent with chronic microvascular ischemic change. Vascular: No hyperdense vessel or unexpected calcification. Skull: Normal. Negative for fracture or focal lesion. Sinuses/Orbits: Globes and orbits are unremarkable. Visualized sinuses are clear. Other: None. CT CERVICAL SPINE FINDINGS Alignment: Straightened cervical lordosis. Slight anterolisthesis  of C4 on C5, degenerative. Skull base and vertebrae: No acute fracture. No primary bone lesion or focal pathologic process. Soft tissues and spinal canal: No prevertebral fluid or swelling. No visible canal hematoma. Disc levels: Mild loss of disc height at C2-C3. Moderate loss of disc height at C5-C6 and C6-C7 with mild disc bulging and endplate spurring. Bilateral facet degenerative changes. No convincing disc herniation. Upper chest: No acute findings. Other: None. IMPRESSION: HEAD CT 1. No acute intracranial abnormalities. CERVICAL CT 1. No fracture or acute finding. Electronically Signed   By: Amie Portland M.D.   On: 01/26/2022 09:53   CT CERVICAL SPINE WO CONTRAST  Result Date: 01/26/2022 CLINICAL DATA:  Witnessed fall today. Small laceration to the back of the head. History of dementia. EXAM:  CT HEAD WITHOUT CONTRAST CT CERVICAL SPINE WITHOUT CONTRAST TECHNIQUE: Multidetector CT imaging of the head and cervical spine was performed following the standard protocol without intravenous contrast. Multiplanar CT image reconstructions of the cervical spine were also generated. RADIATION DOSE REDUCTION: This exam was performed according to the departmental dose-optimization program which includes automated exposure control, adjustment of the mA and/or kV according to patient size and/or use of iterative reconstruction technique. COMPARISON:  Head CT, 01/14/2020. FINDINGS: CT HEAD FINDINGS Brain: No evidence of acute infarction, hemorrhage, hydrocephalus, extra-axial collection or mass lesion/mass effect. Small old lacunar infarct, right thalamus. Mild patchy white matter hypoattenuation consistent with chronic microvascular ischemic change. Vascular: No hyperdense vessel or unexpected calcification. Skull: Normal. Negative for fracture or focal lesion. Sinuses/Orbits: Globes and orbits are unremarkable. Visualized sinuses are clear. Other: None. CT CERVICAL SPINE FINDINGS Alignment: Straightened cervical  lordosis. Slight anterolisthesis of C4 on C5, degenerative. Skull base and vertebrae: No acute fracture. No primary bone lesion or focal pathologic process. Soft tissues and spinal canal: No prevertebral fluid or swelling. No visible canal hematoma. Disc levels: Mild loss of disc height at C2-C3. Moderate loss of disc height at C5-C6 and C6-C7 with mild disc bulging and endplate spurring. Bilateral facet degenerative changes. No convincing disc herniation. Upper chest: No acute findings. Other: None. IMPRESSION: HEAD CT 1. No acute intracranial abnormalities. CERVICAL CT 1. No fracture or acute finding. Electronically Signed   By: Amie Portland M.D.   On: 01/26/2022 09:53   DG Chest Port 1 View  Result Date: 01/26/2022 CLINICAL DATA:  Trauma.  Fall. EXAM: PORTABLE CHEST 1 VIEW COMPARISON:  08/09/2021 FINDINGS: Lungs are hyperexpanded. The lungs are clear without focal pneumonia, edema, pneumothorax or pleural effusion. 15 mm nodular opacity again noted right mid lung. The cardiopericardial silhouette is within normal limits for size. Telemetry leads overlie the chest. Bones are diffusely demineralized. IMPRESSION: 1. Hyperexpansion without acute cardiopulmonary findings. 2. 15 mm nodular opacity right mid lung. This is similar to prior chest x-ray but chest CTs from 12/12/2021 and 08/09/2021 showed no right lung nodule or right rib lesion. As such, finding is indeterminate on today's x-ray. Consider follow-up chest x-ray in 6-12 weeks to re-evaluate. Electronically Signed   By: Kennith Center M.D.   On: 01/26/2022 09:25   DG Pelvis Portable  Result Date: 01/26/2022 CLINICAL DATA:  Fall.  Trauma. EXAM: PORTABLE PELVIS 1-2 VIEWS COMPARISON:  CT pelvis 04/20/2018 FINDINGS: Bones are diffusely demineralized. SI joints and symphysis pubis unremarkable. Cortical line overlies the left pubic bone, potentially projectional although nondisplaced fracture is not excluded. No other acute bony abnormality evident.  IMPRESSION: Possible but not definite nondisplaced fracture of the left pubic bone. Correlation for point tenderness would likely prove helpful. CT pelvis could be used to further evaluate as clinically warranted. Electronically Signed   By: Kennith Center M.D.   On: 01/26/2022 09:21    Procedures .Marland KitchenLaceration Repair  Date/Time: 01/26/2022 1:18 PM  Performed by: Gloris Manchester, MD Authorized by: Gloris Manchester, MD   Laceration details:    Length (cm):  1.5   Depth (mm):  5 Exploration:    Imaging obtained comment:  CT   Imaging outcome: foreign body not noted     Wound exploration: wound explored through full range of motion     Contaminated: no   Treatment:    Area cleansed with:  Saline   Amount of cleaning:  Standard   Irrigation solution:  Sterile saline   Irrigation volume:  100   Irrigation method:  Syringe   Visualized foreign bodies/material removed: no     Debridement:  None Skin repair:    Repair method:  Staples   Number of staples:  3 Approximation:    Approximation:  Close Repair type:    Repair type:  Simple Post-procedure details:    Dressing:  Open (no dressing)   Procedure completion:  Tolerated well, no immediate complications     Medications Ordered in ED Medications  lidocaine-EPINEPHrine-tetracaine (LET) topical gel (3 mLs Topical Not Given 01/26/22 1425)  Tdap (BOOSTRIX) injection 0.5 mL (0.5 mLs Intramuscular Given 01/26/22 1002)    ED Course/ Medical Decision Making/ A&P                           Medical Decision Making Amount and/or Complexity of Data Reviewed Labs: ordered. Radiology: ordered.  Risk Prescription drug management.   This patient presents to the ED for concern of fall, this involves an extensive number of treatment options, and is a complaint that carries with it a high risk of complications and morbidity.  The differential diagnosis includes acute injuries   Co morbidities that complicate the patient evaluation  anemia, CHF,  hypothyroidism, CVA with right hemiparesis, dementia, HTN, HLD, anxiety, COPD, depression   Additional history obtained:  Additional history obtained from EMS External records from outside source obtained and reviewed including EMR   Lab Tests:  I Ordered, and personally interpreted labs.  The pertinent results include: Low-normal blood glucose with otherwise normal lab findings   Imaging Studies ordered:  I ordered imaging studies including x-ray of chest and pelvis, CT imaging of head, cervical spine, chest, abdomen, and pelvis I independently visualized and interpreted imaging which showed no acute injuries.  There was progression of a right infrarenal stone.  There was no evidence of gas bubbles in right infrarenal collecting system suggestive of reflux from Foley catheter.  There was a possible 6.4 mm stone at right UVJ without upstream hydronephrosis. I agree with the radiologist interpretation   Cardiac Monitoring: / EKG:  The patient was maintained on a cardiac monitor.  I personally viewed and interpreted the cardiac monitored which showed an underlying rhythm of: Sinus rhythm   Consultations Obtained:  I requested consultation with the neurologist, Dr. Lafonda Mosses,  and discussed lab and imaging findings as well as pertinent plan - they recommend: Urinalysis and antibiotics if infection is present.   Problem List / ED Course / Critical interventions / Medication management  Patient is a 71 year old female with history of dementia, presenting after a witnessed fall at her assisted living facility.  Patient was walking with an aide when she lost her footing, causing her to fall.  When she fell, she struck the back of her head against the frame of the door.  She did not lose consciousness.  She was ambulatory on scene.  EMS reports that staff at facility were very clear that she is currently at her baseline mental state.  Patient's history is limited by her dementia.  She has a  small, 1 cm hemostatic laceration to the upper occiput of her scalp.  This wound is hemostatic.  She has no other areas of tenderness on exam.  Patient does not appear to be in any acute pain while at rest.  She is not on any blood thinners.  Patient was given Tdap for tetanus prophylaxis.  She underwent CT imaging of head and cervical spine.  Results of the studies were negative for acute findings.  She also underwent a chest and pelvic x-ray.  Pelvic x-ray showed concern of possible fracture which prompted CT scan.  There was also a finding on her chest x-ray of a new nodule.  Patient underwent CT scanning of chest, abdomen, and pelvis.  On CT imaging of chest, right lower lobe nodule is 2 mm.  This does not require any further acute work-up.  There is no evidence of pelvic fracture on CT imaging.  There were incidental findings of progressive right intrarenal stone, possible right UVJ stone, and gas bubbles in right intrarenal collecting system.  I have low suspicion that this is associated with an infectious process.  Patient has no leukocytosis, no CVA tenderness.  She is afebrile with normal vital signs on arrival.  Nursing staff noted that she was in her normal state of health prior to this fall.  Patient underwent wound cleansing and closure with 3 staples.  I did speak with urologist on-call regarding the incidental findings on her CT scan.  He does recommend replacement of Foley and urinalysis to assess for possible infection.  This study was pending at time of signout.  Care of patient was signed out to oncoming ED provider. I ordered medication including Tdap for tetanus prophylaxis Reevaluation of the patient after these medicines showed that the patient stayed the same I have reviewed the patients home medicines and have made adjustments as needed   Social Determinants of Health:  Has dementia and resides in assisted living facility         Final Clinical Impression(s) / ED  Diagnoses Final diagnoses:  Fall, initial encounter  Laceration of scalp, initial encounter    Rx / DC Orders ED Discharge Orders     None         Gloris Manchester, MD 01/26/22 1604    Gloris Manchester, MD 01/26/22 [phone]

## 2022-01-29 LAB — URINE CULTURE: Culture: >=100000 — AB

## 2022-01-30 ENCOUNTER — Telehealth: Payer: Self-pay | Admitting: *Deleted

## 2022-01-30 NOTE — Progress Notes (Signed)
ED Antimicrobial Stewardship Positive Culture Follow Up   Kimberly [name] is an 71 y.o. female who presented to [facility] on 01/26/2022 with a chief complaint of  Chief Complaint  Patient presents with   Fall    Recent Results (from the past 720 hour(s))  Urine Culture     Status: Abnormal   Collection Time: 01/26/22  4:24 PM   Specimen: Urine, Catheterized  Result Value Ref Range Status   Specimen Description   Final    URINE, CATHETERIZED Performed at [facility], [address] [place], [place] [address]    Special Requests   Final    NONE Performed at [facility], [address] [place], [place] [address]    Culture (A)  Final    >=100,000 COLONIES/mL ESCHERICHIA COLI Confirmed Extended Spectrum Beta-Lactamase Producer (ESBL).  In bloodstream infections from ESBL organisms, carbapenems are preferred over piperacillin/tazobactam. They are shown to have a lower risk of mortality.    Report Status 01/29/2022 FINAL  Final   Organism ID, Bacteria ESCHERICHIA COLI (A)  Final      Susceptibility   Escherichia coli - MIC*    AMPICILLIN >=32 RESISTANT Resistant     CEFAZOLIN >=64 RESISTANT Resistant     CEFEPIME >=32 RESISTANT Resistant     CEFTRIAXONE >=64 RESISTANT Resistant     CIPROFLOXACIN >=4 RESISTANT Resistant     GENTAMICIN <=1 SENSITIVE Sensitive     IMIPENEM <=0.25 SENSITIVE Sensitive     NITROFURANTOIN 32 SENSITIVE Sensitive     TRIMETH/SULFA >=320 RESISTANT Resistant     AMPICILLIN/SULBACTAM >=32 RESISTANT Resistant     PIP/TAZO 16 SENSITIVE Sensitive     * >=100,000 COLONIES/mL ESCHERICHIA COLI   71 YOF who presented to ED after fall. From assisted living facility. Sustained small wound to back of head. Hx of CVA and dementia. Unable to perform ROS. No fever. Has chronic indwelling foley catheter with clear urine output noted. Laceration repair performed. Pt given Tdap. Pelvic x-ray concerning for pelvic fracture, CT negative for pelvic fracture but incidental  finding of possibly R UVJ stone, progressive R intrarenal stone, gas bubbles in intrarenal collecting system. No CVA tenderness noted. UA from catheter showed >50 WBC, few bacteria, large leukocytes. Discussed with Army Melia, PA-C. Stop cephalexin. If symptoms persist, have patient return to ED for treatment.   [x]  Treated with cephalexin, organism resistant to prescribed antimicrobial []  Patient discharged originally without antimicrobial agent and treatment is now indicated  Plan: STOP cephalexin. If symptoms persist, return to ED for treatment.   ED Provider: , PA-C  , PharmD PGY1 Pharmacy Resident   01/30/2022  9:15 AM  Clinical Pharmacist Monday - Friday phone -  [phone] Saturday - Sunday phone - [phone]

## 2022-05-26 DEATH — deceased

## 2022-09-22 IMAGING — DX DG CHEST 1V
1 series · 1 of 1 positions shown · non-contrast
Comparison: None.

CLINICAL DATA: Hypoxia

EXAM:
CHEST  1 VIEW

[chest ap]
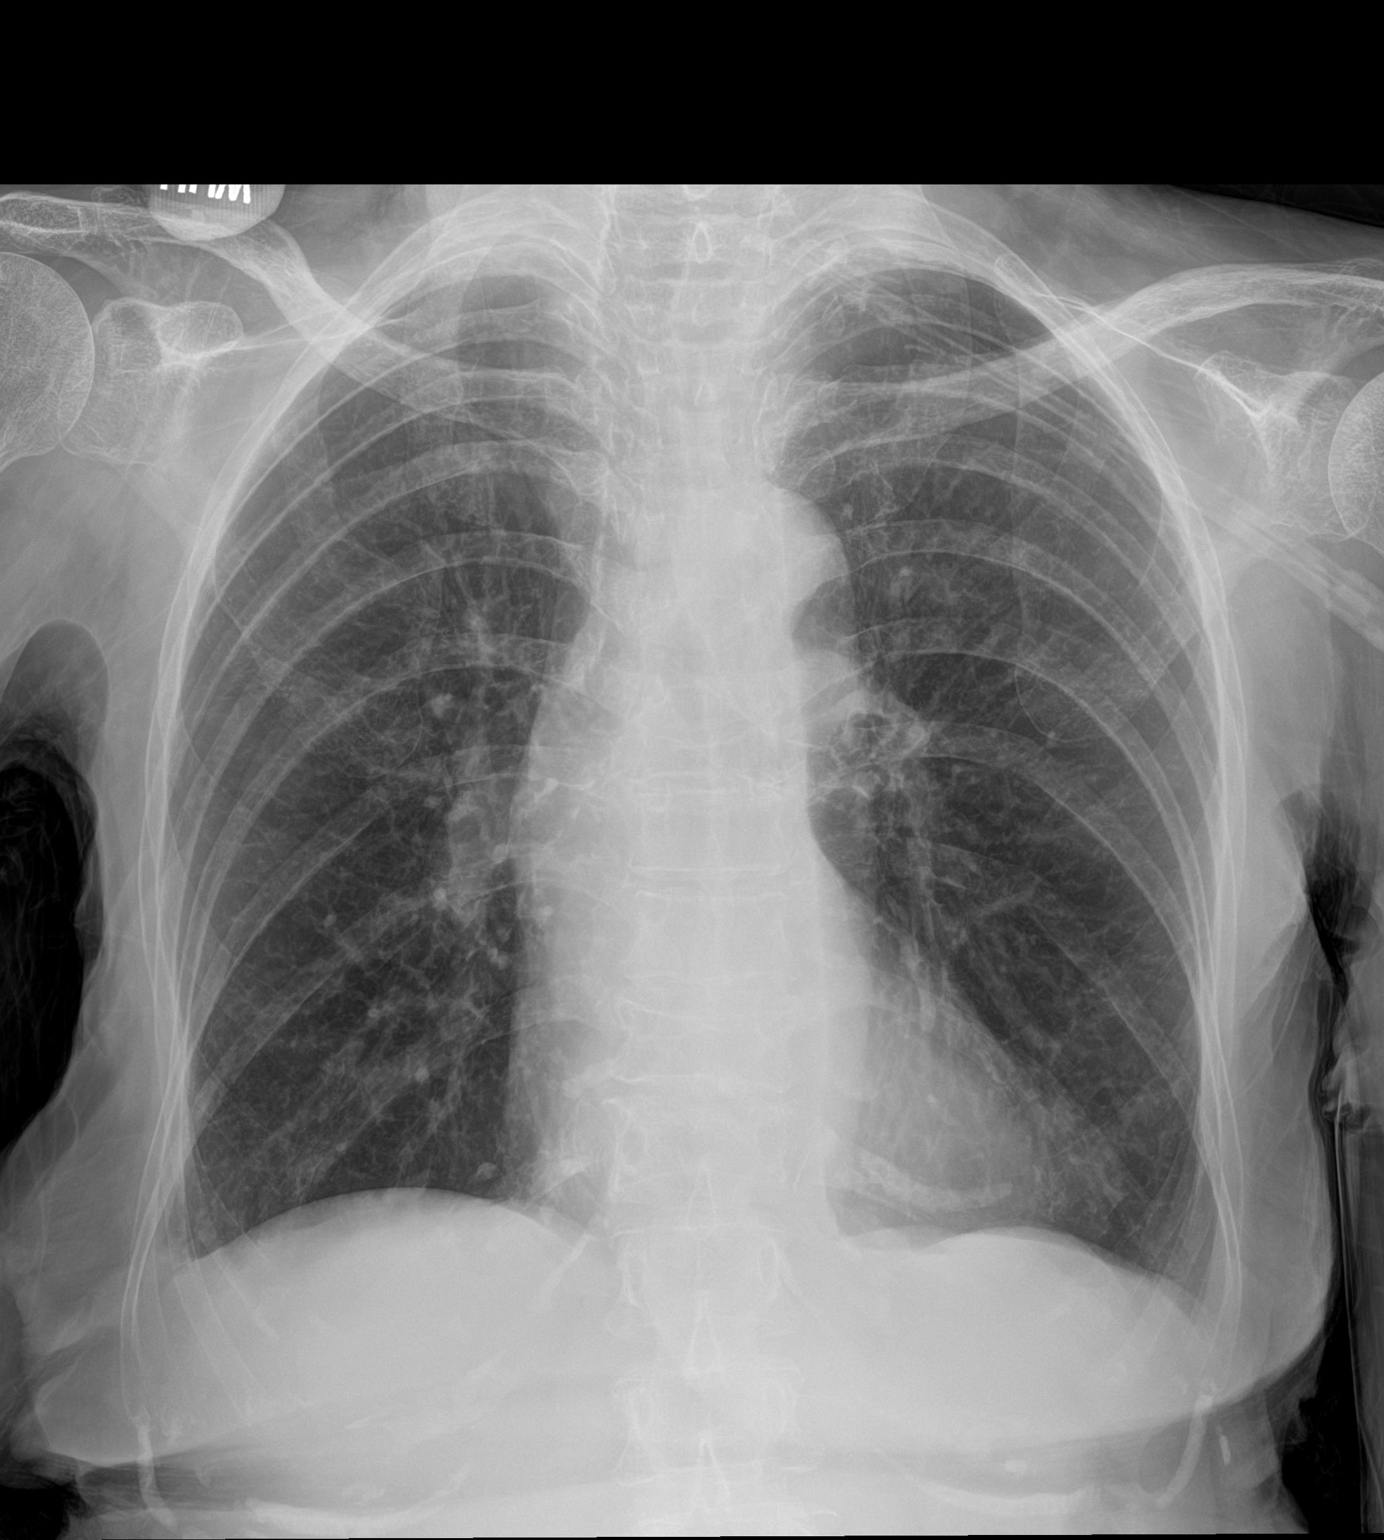

[1 of 1 positions shown; findings below may reference images not displayed]

FINDINGS: The heart size and mediastinal contours are within normal limits.
Both lungs are clear. The visualized skeletal structures are
unremarkable.
IMPRESSION: No active disease.

## 2023-07-06 IMAGING — CT CT ANGIO CHEST
2 of 7 series · 16 of 36 positions shown · IV contrast (Omnipaque or Isovue)
Comparison: Chest radiograph dated 08/09/2021

CLINICAL DATA: Acute respiratory failure with hypoxia, recent COVID
infection, elevated D-dimer

EXAM:
CT ANGIOGRAPHY CHEST WITH CONTRAST
TECHNIQUE: Multidetector CT imaging of the chest was performed using the
standard protocol during bolus administration of intravenous
contrast. Multiplanar CT image reconstructions and MIPs were
obtained to evaluate the vascular anatomy.

[Series 5: pe axial thins · axial · 0.59mm/px · z∈[+1145,+1398]mm · 15 of 363 slices shown]
[im 23/363  lung]
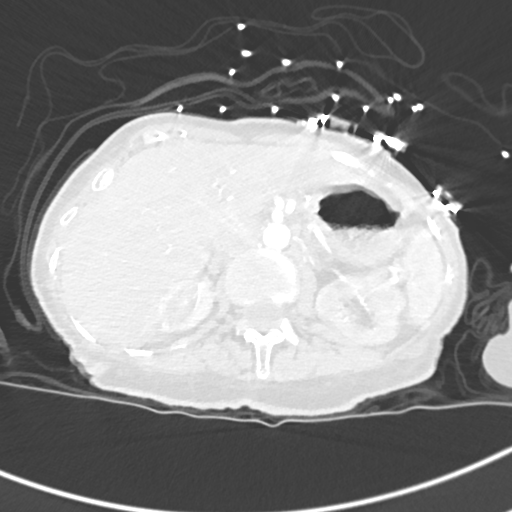
[im 46/363  mediastinal]
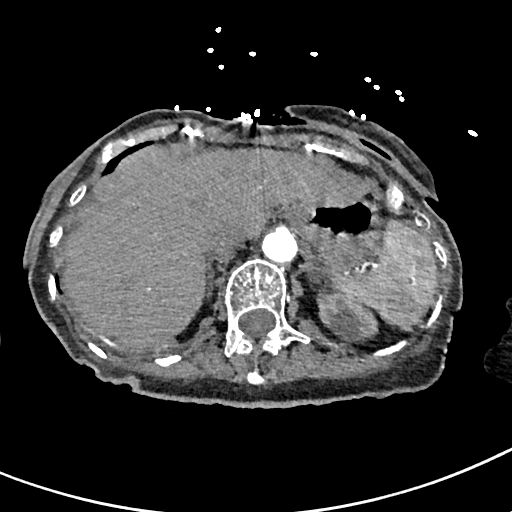
[im 68/363  lung]
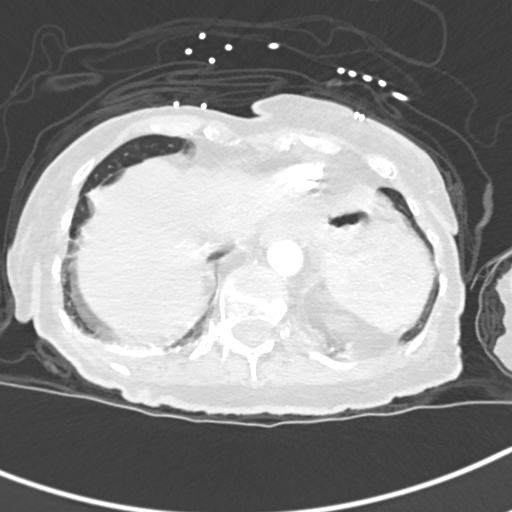
[im 91/363  mediastinal]
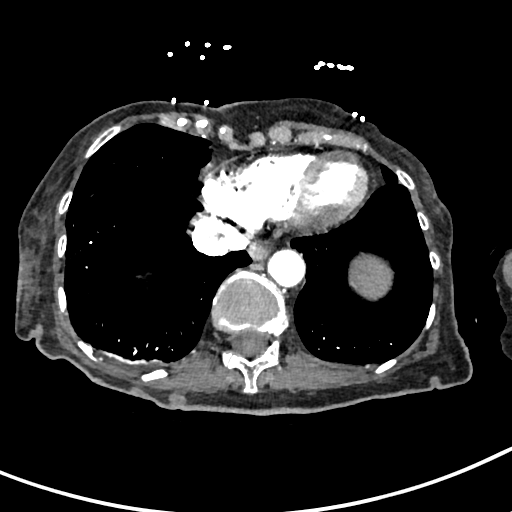
[im 114/363  lung]
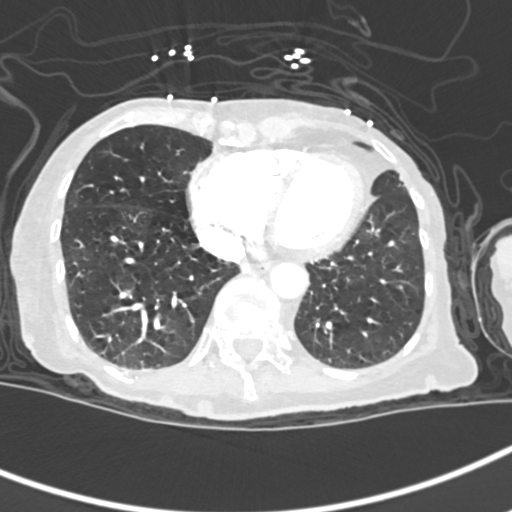
[im 136/363  mediastinal]
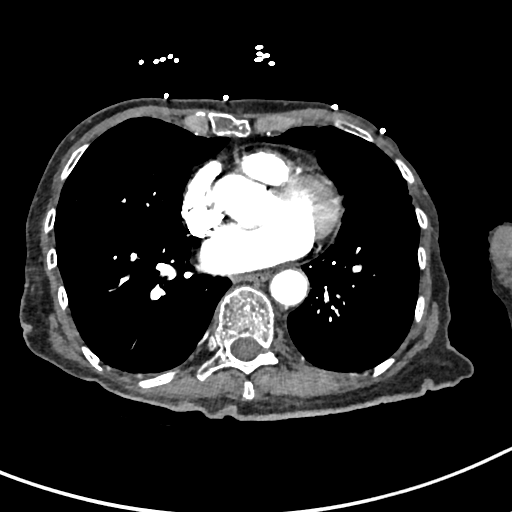
[im 159/363  lung]
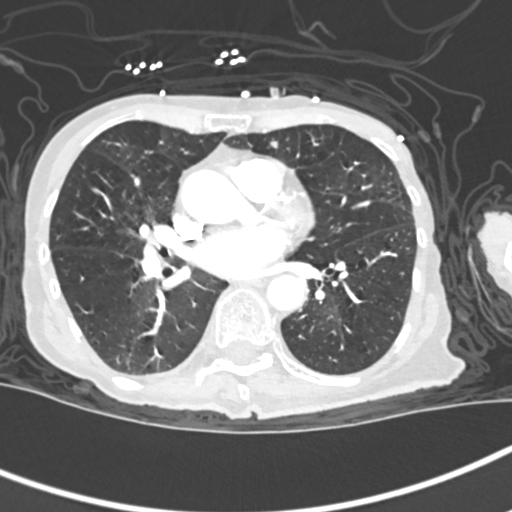
[im 182/363  mediastinal]
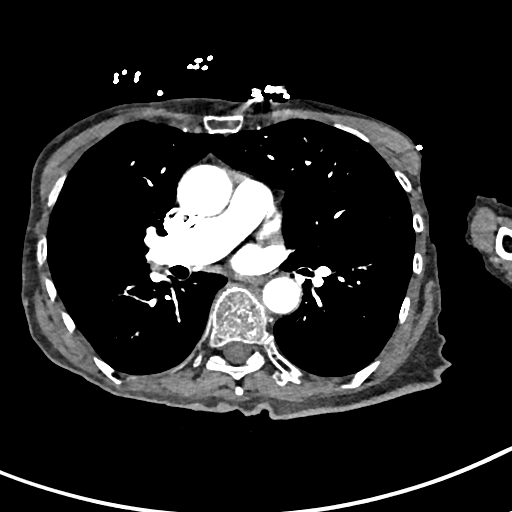
[im 204/363  lung]
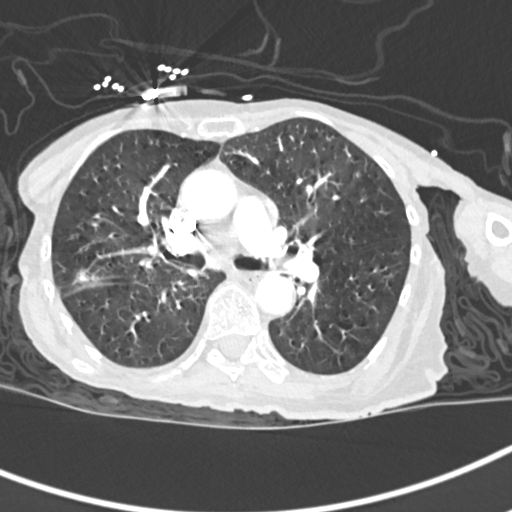
[im 227/363  mediastinal]
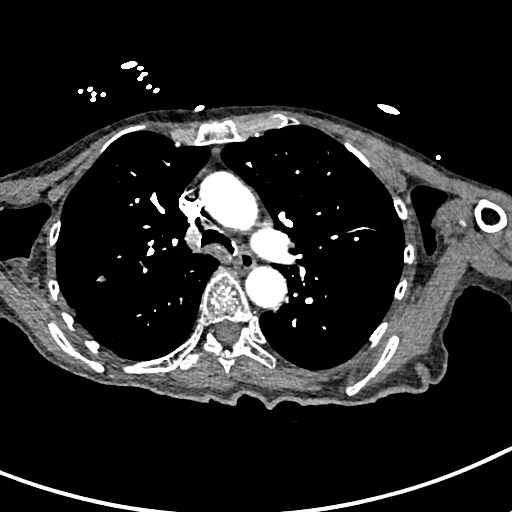
[im 249/363  lung]
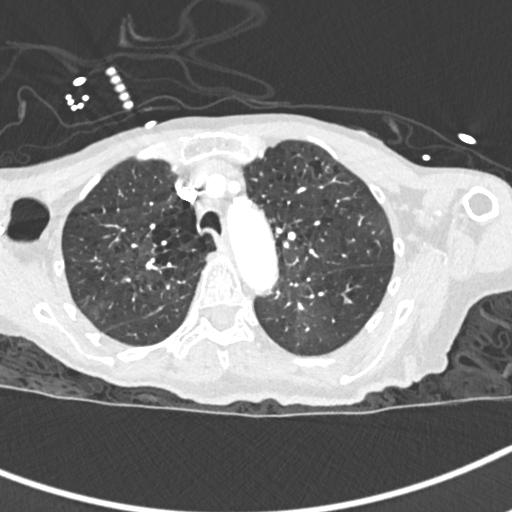
[im 272/363  mediastinal]
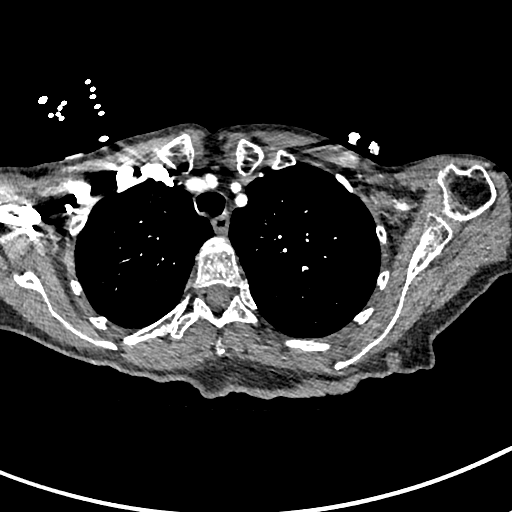
[im 295/363  lung]
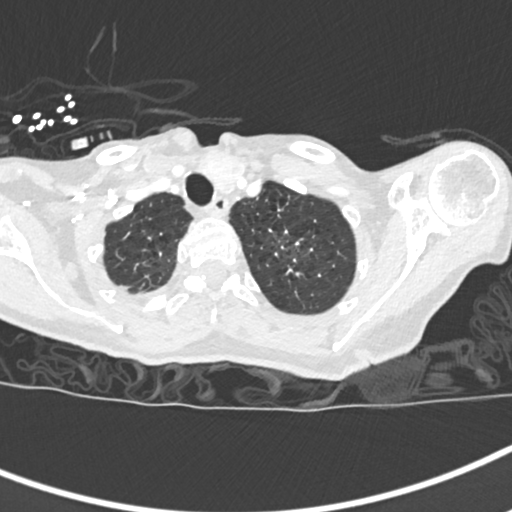
[im 317/363  mediastinal]
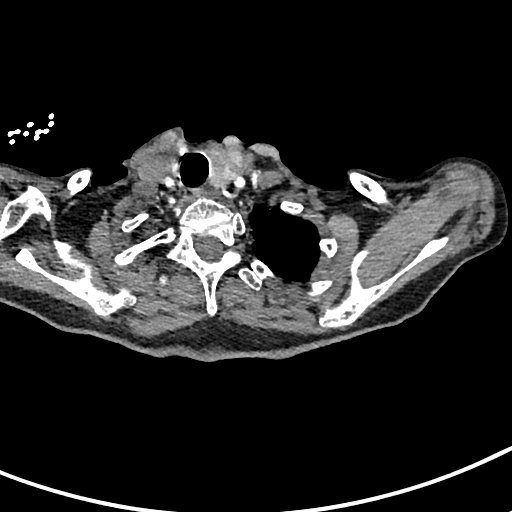
[im 340/363  lung]
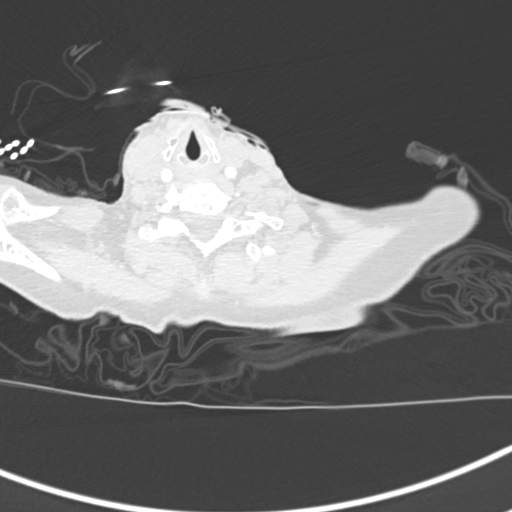

[Series 7: cor soft · coronal · 0.59mm/px · 1 of 105 slices shown]
[im 53/105  mediastinal]
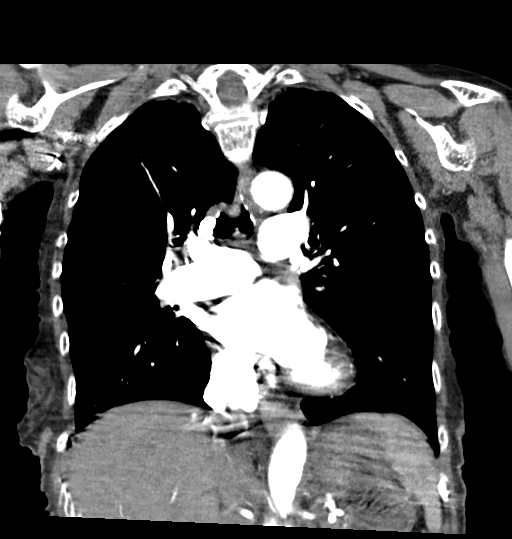

[16 of 36 positions shown; findings below may reference images not displayed]

RADIATION DOSE REDUCTION: This exam was performed according to the
departmental dose-optimization program which includes automated
exposure control, adjustment of the mA and/or kV according to
patient size and/or use of iterative reconstruction technique.

CONTRAST:  75mL OMNIPAQUE IOHEXOL 350 MG/ML SOLN
FINDINGS: Cardiovascular: Satisfactory opacification the bilateral pulmonary
arteries to the segmental level. No evidence of pulmonary embolism.

Although not tailored for evaluation of the thoracic aorta, there is
no evidence of thoracic aortic aneurysm or dissection.

Heart is normal in size.  No pericardial effusion.

Very mild coronary atherosclerosis of the LAD.

Mediastinum/Nodes: No suspicious mediastinal lymphadenopathy.

Status post right thyroidectomy. Enlarged, heterogeneous left
thyroid with nodules measuring up to 13 mm (series 4/image 14),
compatible multinodular goiter. No follow-up is recommended.

Lungs/Pleura: 17 mm irregular nodule in the posterior right upper
lobe (series 6/image 60), with adjacent tree-in-bud satellite nodule
(series 6/image 59), favoring infection/pneumonia.

Mild right basilar atelectasis with trace right pleural effusion.

Mild centrilobular and paraseptal emphysematous changes.

No pneumothorax.

Upper Abdomen: Visualized upper abdomen is grossly unremarkable.

Musculoskeletal: Moderate compression fracture deformity at T8 with
mild superior endplate compression fracture deformities at T11 and
T12, likely chronic.

Review of the MIP images confirms the above findings.
IMPRESSION: No evidence of pulmonary embolism.

Patchy/nodular opacities in the posterior right upper lobe,
measuring up to 17 mm, favoring infection/pneumonia. Consider
follow-up CT chest in 6 weeks.

Aortic Atherosclerosis (19KJA-AO7.7) and Emphysema (19KJA-9R9.I).

## 2023-07-06 IMAGING — DX DG CHEST 1V PORT
1 series · 1 of 1 positions shown · non-contrast
Comparison: October 26, 2020.

CLINICAL DATA: A 71-year-old female presents for evaluation of
sepsis, history of COVID infection by report.

EXAM:
PORTABLE CHEST 1 VIEW

[chest ap]
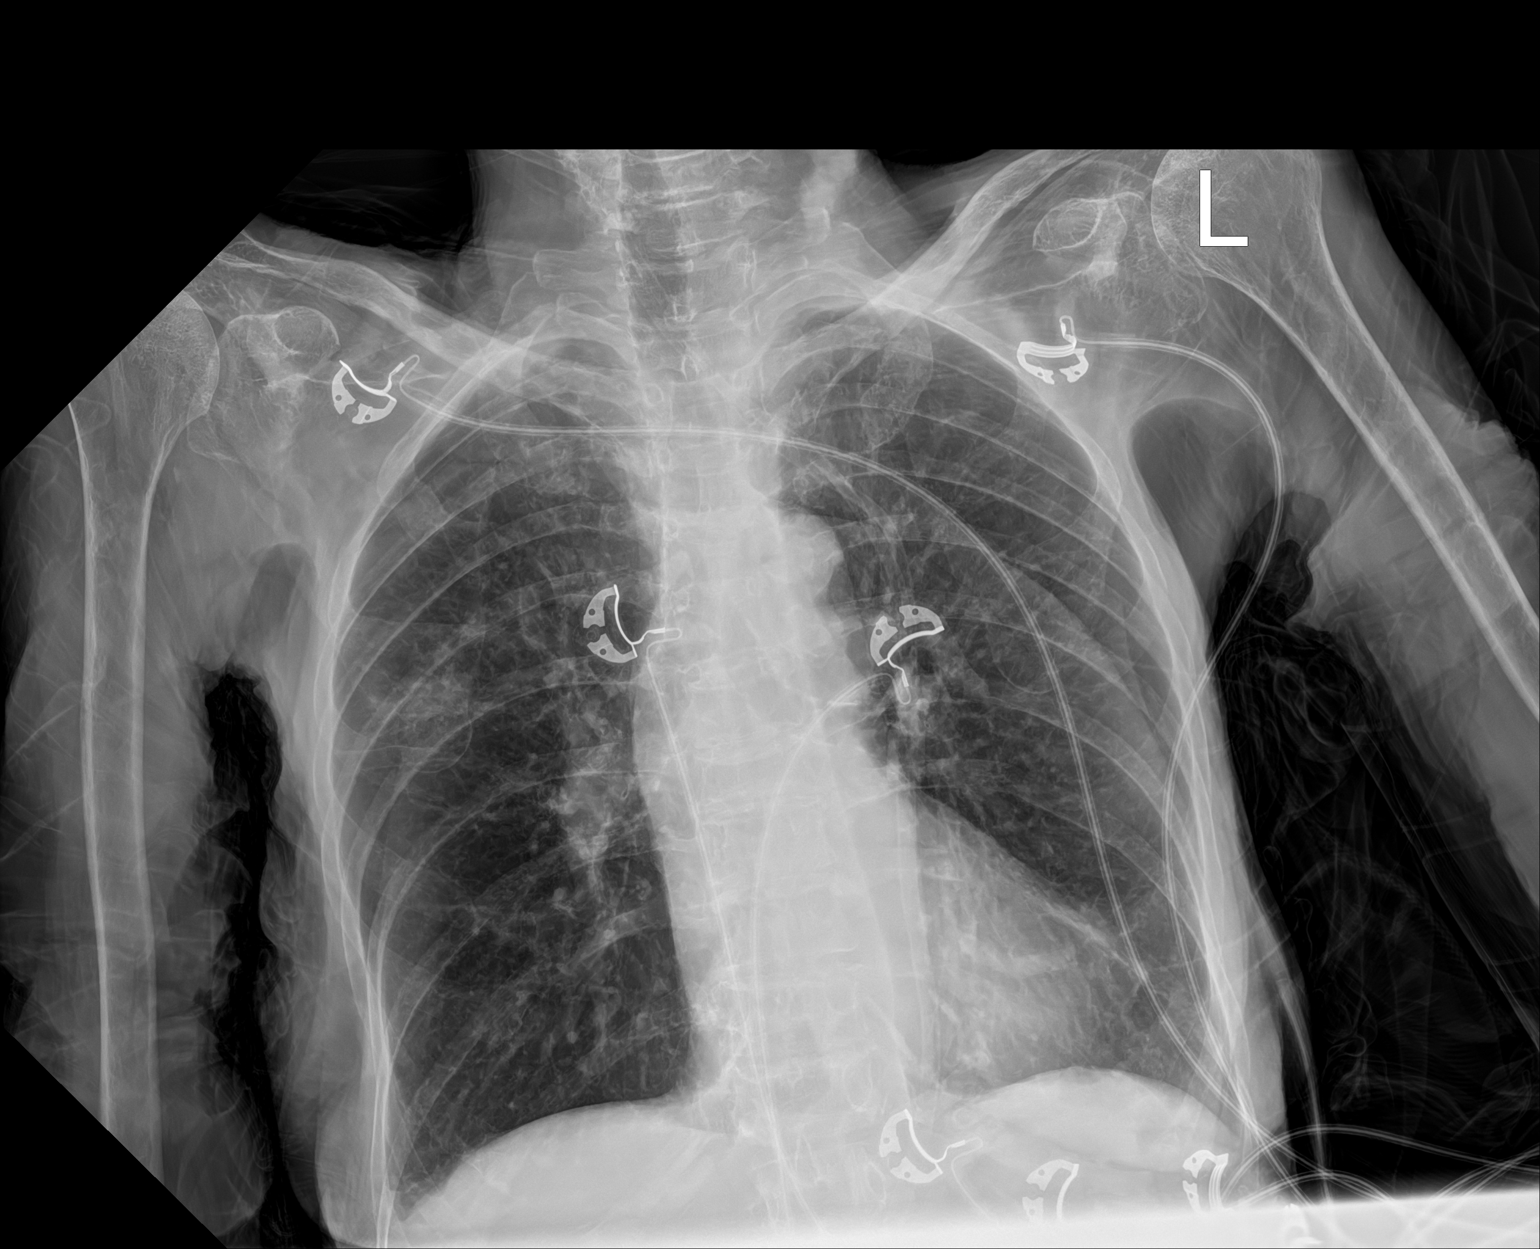

[1 of 1 positions shown; findings below may reference images not displayed]

FINDINGS: EKG leads project over the patient's chest.

Cardiomediastinal contours and hilar structures are normal.

No lobar consolidation. No sign of effusion. Nodular opacity in the
RIGHT upper lobe/upper chest is new compared to previous imaging
measuring up to 18 mm.

On limited assessment there is no acute skeletal finding.
IMPRESSION: 1. Nodular opacity in the RIGHT upper chest of uncertain
significance, new compared to prior imaging. This may be related to
ongoing inflammation in light of recent [id number]-UB diagnosis. Would
however suggest 4-6 week follow-up with PA and lateral chest to
ensure resolution and exclude underlying new pulmonary nodule.
2. No lobar consolidation or pleural effusion.
